# Patient Record
Sex: Female | Born: 1960 | Race: Black or African American | Hispanic: No | Marital: Married | Smoking: Never smoker
Health system: Southern US, Community
[De-identification: ages and names within clinical notes are randomized; demographics above are authoritative.]

## PROBLEM LIST (undated history)

## (undated) DIAGNOSIS — IMO0002 Reserved for concepts with insufficient information to code with codable children: Secondary | ICD-10-CM

## (undated) DIAGNOSIS — K219 Gastro-esophageal reflux disease without esophagitis: Secondary | ICD-10-CM

## (undated) DIAGNOSIS — G4730 Sleep apnea, unspecified: Secondary | ICD-10-CM

## (undated) DIAGNOSIS — I471 Supraventricular tachycardia, unspecified: Secondary | ICD-10-CM

## (undated) DIAGNOSIS — Z87442 Personal history of urinary calculi: Secondary | ICD-10-CM

## (undated) DIAGNOSIS — R918 Other nonspecific abnormal finding of lung field: Secondary | ICD-10-CM

## (undated) DIAGNOSIS — I10 Essential (primary) hypertension: Secondary | ICD-10-CM

## (undated) DIAGNOSIS — G8929 Other chronic pain: Secondary | ICD-10-CM

## (undated) DIAGNOSIS — M549 Dorsalgia, unspecified: Secondary | ICD-10-CM

## (undated) DIAGNOSIS — M722 Plantar fascial fibromatosis: Secondary | ICD-10-CM

## (undated) DIAGNOSIS — Z8616 Personal history of COVID-19: Secondary | ICD-10-CM

## (undated) DIAGNOSIS — R51 Headache: Secondary | ICD-10-CM

## (undated) HISTORY — DX: Gastro-esophageal reflux disease without esophagitis: K21.9

## (undated) HISTORY — DX: Essential (primary) hypertension: I10

## (undated) HISTORY — DX: Plantar fascial fibromatosis: M72.2

## (undated) HISTORY — DX: Personal history of urinary calculi: Z87.442

## (undated) HISTORY — DX: Sleep apnea, unspecified: G47.30

## (undated) HISTORY — DX: Dorsalgia, unspecified: M54.9

## (undated) HISTORY — DX: Reserved for concepts with insufficient information to code with codable children: IMO0002

## (undated) HISTORY — PX: OOPHORECTOMY: SHX86

## (undated) HISTORY — DX: Headache: R51

---

## 1979-08-08 DIAGNOSIS — IMO0002 Reserved for concepts with insufficient information to code with codable children: Secondary | ICD-10-CM

## 1979-08-08 HISTORY — DX: Reserved for concepts with insufficient information to code with codable children: IMO0002

## 1982-12-07 HISTORY — PX: CHOLECYSTECTOMY: SHX55

## 2000-12-07 HISTORY — PX: ABDOMINAL HYSTERECTOMY: SHX81

## 2000-12-07 LAB — HM COLONOSCOPY: HM Colonoscopy: NORMAL

## 2006-08-11 ENCOUNTER — Ambulatory Visit: Payer: Self-pay | Admitting: Family Medicine

## 2006-08-19 ENCOUNTER — Encounter: Payer: Self-pay | Admitting: Obstetrics & Gynecology

## 2006-08-19 ENCOUNTER — Ambulatory Visit: Payer: Self-pay | Admitting: Obstetrics & Gynecology

## 2006-12-29 ENCOUNTER — Ambulatory Visit: Payer: Self-pay | Admitting: Family Medicine

## 2007-02-09 ENCOUNTER — Ambulatory Visit: Payer: Self-pay | Admitting: Family Medicine

## 2007-06-24 ENCOUNTER — Telehealth: Payer: Self-pay | Admitting: Family Medicine

## 2007-07-05 ENCOUNTER — Ambulatory Visit: Payer: Self-pay | Admitting: Family Medicine

## 2007-07-05 DIAGNOSIS — R519 Headache, unspecified: Secondary | ICD-10-CM | POA: Insufficient documentation

## 2007-07-05 DIAGNOSIS — Z8711 Personal history of peptic ulcer disease: Secondary | ICD-10-CM | POA: Insufficient documentation

## 2007-07-05 DIAGNOSIS — R51 Headache: Secondary | ICD-10-CM

## 2007-07-05 DIAGNOSIS — K219 Gastro-esophageal reflux disease without esophagitis: Secondary | ICD-10-CM | POA: Insufficient documentation

## 2007-07-05 DIAGNOSIS — Z87442 Personal history of urinary calculi: Secondary | ICD-10-CM | POA: Insufficient documentation

## 2008-01-11 ENCOUNTER — Ambulatory Visit: Payer: Self-pay | Admitting: Family Medicine

## 2008-01-11 ENCOUNTER — Telehealth: Payer: Self-pay | Admitting: Family Medicine

## 2008-01-18 ENCOUNTER — Telehealth: Payer: Self-pay | Admitting: Family Medicine

## 2008-01-25 ENCOUNTER — Ambulatory Visit: Payer: Self-pay | Admitting: Family Medicine

## 2008-01-25 DIAGNOSIS — I10 Essential (primary) hypertension: Secondary | ICD-10-CM | POA: Insufficient documentation

## 2008-01-30 LAB — CONVERTED CEMR LAB
ALT: 20 units/L (ref 0–35)
AST: 22 units/L (ref 0–37)
Albumin: 3.8 g/dL (ref 3.5–5.2)
Alkaline Phosphatase: 78 units/L (ref 39–117)
BUN: 7 mg/dL (ref 6–23)
Basophils Absolute: 0 10*3/uL (ref 0.0–0.1)
Basophils Relative: 1 % (ref 0.0–1.0)
Bilirubin, Direct: 0.1 mg/dL (ref 0.0–0.3)
CO2: 25 meq/L (ref 19–32)
Calcium: 9.3 mg/dL (ref 8.4–10.5)
Chloride: 105 meq/L (ref 96–112)
Cholesterol: 201 mg/dL (ref 0–200)
Creatinine, Ser: 0.8 mg/dL (ref 0.4–1.2)
Direct LDL: 109.4 mg/dL
Eosinophils Absolute: 0.1 10*3/uL (ref 0.0–0.6)
Eosinophils Relative: 2.2 % (ref 0.0–5.0)
GFR calc Af Amer: 98 mL/min
GFR calc non Af Amer: 81 mL/min
Glucose, Bld: 74 mg/dL (ref 70–99)
HCT: 42.8 % (ref 36.0–46.0)
HDL: 70.5 mg/dL (ref >39.0)
Hemoglobin: 13.6 g/dL (ref 12.0–15.0)
Lymphocytes Relative: 45.7 % (ref 12.0–46.0)
MCHC: 31.8 g/dL (ref 30.0–36.0)
MCV: 78.9 fL (ref 78.0–100.0)
Monocytes Absolute: 0.2 10*3/uL (ref 0.2–0.7)
Monocytes Relative: 5.6 % (ref 3.0–11.0)
Neutro Abs: 1.9 10*3/uL (ref 1.4–7.7)
Neutrophils Relative %: 45.5 % (ref 43.0–77.0)
Phosphorus: 2.4 mg/dL (ref 2.3–4.6)
Platelets: 231 10*3/uL (ref 150–400)
Potassium: 3.4 meq/L — ABNORMAL LOW (ref 3.5–5.1)
RBC: 5.42 M/uL — ABNORMAL HIGH (ref 3.87–5.11)
RDW: 12.7 % (ref 11.5–14.6)
Sodium: 139 meq/L (ref 135–145)
TSH: 0.78 microintl units/mL (ref 0.35–5.50)
Total Bilirubin: 0.8 mg/dL (ref 0.3–1.2)
Total CHOL/HDL Ratio: 2.9
Total Protein: 7.2 g/dL (ref 6.0–8.3)
Triglycerides: 44 mg/dL (ref 0–149)
VLDL: 9 mg/dL (ref 0–40)
WBC: 4.2 10*3/uL — ABNORMAL LOW (ref 4.5–10.5)

## 2008-02-06 ENCOUNTER — Encounter: Payer: Self-pay | Admitting: Family Medicine

## 2008-02-10 ENCOUNTER — Telehealth: Payer: Self-pay | Admitting: Family Medicine

## 2008-02-28 ENCOUNTER — Ambulatory Visit: Payer: Self-pay | Admitting: Family Medicine

## 2008-04-03 ENCOUNTER — Ambulatory Visit: Payer: Self-pay | Admitting: Family Medicine

## 2008-04-03 DIAGNOSIS — J309 Allergic rhinitis, unspecified: Secondary | ICD-10-CM | POA: Insufficient documentation

## 2008-04-13 ENCOUNTER — Telehealth: Payer: Self-pay | Admitting: Family Medicine

## 2008-04-26 ENCOUNTER — Encounter: Payer: Self-pay | Admitting: Obstetrics & Gynecology

## 2008-04-26 ENCOUNTER — Ambulatory Visit: Payer: Self-pay | Admitting: Obstetrics & Gynecology

## 2008-05-01 ENCOUNTER — Ambulatory Visit: Payer: Self-pay | Admitting: Family Medicine

## 2008-05-07 LAB — CONVERTED CEMR LAB: Pap Smear: NORMAL

## 2008-05-16 ENCOUNTER — Ambulatory Visit: Payer: Self-pay | Admitting: Specialist

## 2008-05-17 ENCOUNTER — Encounter: Admission: RE | Admit: 2008-05-17 | Discharge: 2008-05-17 | Payer: Self-pay | Admitting: Obstetrics & Gynecology

## 2008-05-29 ENCOUNTER — Telehealth (INDEPENDENT_AMBULATORY_CARE_PROVIDER_SITE_OTHER): Payer: Self-pay | Admitting: *Deleted

## 2008-07-30 ENCOUNTER — Ambulatory Visit: Payer: Self-pay | Admitting: Specialist

## 2009-06-20 ENCOUNTER — Ambulatory Visit: Payer: Self-pay | Admitting: Family Medicine

## 2009-06-20 DIAGNOSIS — Z780 Asymptomatic menopausal state: Secondary | ICD-10-CM | POA: Insufficient documentation

## 2009-06-24 LAB — CONVERTED CEMR LAB
ALT: 43 units/L — ABNORMAL HIGH (ref 0–35)
AST: 35 units/L (ref 0–37)
Albumin: 3.6 g/dL (ref 3.5–5.2)
Alkaline Phosphatase: 99 units/L (ref 39–117)
BUN: 10 mg/dL (ref 6–23)
Basophils Absolute: 0 10*3/uL (ref 0.0–0.1)
Basophils Relative: 0.3 % (ref 0.0–3.0)
Bilirubin, Direct: 0.1 mg/dL (ref 0.0–0.3)
CO2: 29 meq/L (ref 19–32)
Calcium: 9.3 mg/dL (ref 8.4–10.5)
Chloride: 108 meq/L (ref 96–112)
Cholesterol: 230 mg/dL — ABNORMAL HIGH (ref 0–200)
Creatinine, Ser: 0.8 mg/dL (ref 0.4–1.2)
Direct LDL: 132.9 mg/dL
Eosinophils Absolute: 0.1 10*3/uL (ref 0.0–0.7)
Eosinophils Relative: 0.8 % (ref 0.0–5.0)
GFR calc non Af Amer: 97.61 mL/min (ref >60)
Glucose, Bld: 79 mg/dL (ref 70–99)
HCT: 42.7 % (ref 36.0–46.0)
HDL: 84.2 mg/dL (ref >39.00)
Hemoglobin: 14.1 g/dL (ref 12.0–15.0)
Lymphocytes Relative: 23.7 % (ref 12.0–46.0)
Lymphs Abs: 1.9 10*3/uL (ref 0.7–4.0)
MCHC: 33 g/dL (ref 30.0–36.0)
MCV: 80.3 fL (ref 78.0–100.0)
Monocytes Absolute: 0.5 10*3/uL (ref 0.1–1.0)
Monocytes Relative: 5.7 % (ref 3.0–12.0)
Neutro Abs: 5.5 10*3/uL (ref 1.4–7.7)
Neutrophils Relative %: 69.5 % (ref 43.0–77.0)
Platelets: 238 10*3/uL (ref 150.0–400.0)
Potassium: 3.2 meq/L — ABNORMAL LOW (ref 3.5–5.1)
RBC: 5.31 M/uL — ABNORMAL HIGH (ref 3.87–5.11)
RDW: 14.2 % (ref 11.5–14.6)
Sodium: 143 meq/L (ref 135–145)
TSH: 1.09 microintl units/mL (ref 0.35–5.50)
Total Bilirubin: 0.8 mg/dL (ref 0.3–1.2)
Total CHOL/HDL Ratio: 3
Total Protein: 7.3 g/dL (ref 6.0–8.3)
Triglycerides: 43 mg/dL (ref 0.0–149.0)
VLDL: 8.6 mg/dL (ref 0.0–40.0)
WBC: 8 10*3/uL (ref 4.5–10.5)

## 2009-06-25 ENCOUNTER — Encounter: Admission: RE | Admit: 2009-06-25 | Discharge: 2009-06-25 | Payer: Self-pay | Admitting: Family Medicine

## 2009-06-27 ENCOUNTER — Encounter (INDEPENDENT_AMBULATORY_CARE_PROVIDER_SITE_OTHER): Payer: Self-pay | Admitting: *Deleted

## 2009-07-10 ENCOUNTER — Ambulatory Visit: Payer: Self-pay | Admitting: Family Medicine

## 2009-07-10 DIAGNOSIS — E876 Hypokalemia: Secondary | ICD-10-CM | POA: Insufficient documentation

## 2009-07-12 ENCOUNTER — Ambulatory Visit: Payer: Self-pay | Admitting: Family Medicine

## 2009-07-12 LAB — CONVERTED CEMR LAB
OCCULT 1: NEGATIVE
OCCULT 2: NEGATIVE
OCCULT 3: NEGATIVE

## 2009-07-15 ENCOUNTER — Encounter (INDEPENDENT_AMBULATORY_CARE_PROVIDER_SITE_OTHER): Payer: Self-pay | Admitting: *Deleted

## 2009-07-15 LAB — CONVERTED CEMR LAB: Potassium: 3.7 meq/L (ref 3.5–5.1)

## 2009-10-15 ENCOUNTER — Encounter (INDEPENDENT_AMBULATORY_CARE_PROVIDER_SITE_OTHER): Payer: Self-pay | Admitting: *Deleted

## 2009-12-25 ENCOUNTER — Encounter: Payer: Self-pay | Admitting: Family Medicine

## 2010-04-07 ENCOUNTER — Ambulatory Visit: Payer: Self-pay | Admitting: Family Medicine

## 2010-04-07 DIAGNOSIS — R5381 Other malaise: Secondary | ICD-10-CM | POA: Insufficient documentation

## 2010-04-07 DIAGNOSIS — R209 Unspecified disturbances of skin sensation: Secondary | ICD-10-CM | POA: Insufficient documentation

## 2010-04-07 DIAGNOSIS — R002 Palpitations: Secondary | ICD-10-CM | POA: Insufficient documentation

## 2010-04-07 DIAGNOSIS — R5383 Other fatigue: Secondary | ICD-10-CM

## 2010-04-09 ENCOUNTER — Encounter: Payer: Self-pay | Admitting: Family Medicine

## 2010-04-09 LAB — CONVERTED CEMR LAB
ALT: 27 units/L (ref 0–35)
AST: 23 units/L (ref 0–37)
Albumin: 3.4 g/dL — ABNORMAL LOW (ref 3.5–5.2)
Alkaline Phosphatase: 101 units/L (ref 39–117)
BUN: 9 mg/dL (ref 6–23)
Basophils Absolute: 0 10*3/uL (ref 0.0–0.1)
Basophils Relative: 0.5 % (ref 0.0–3.0)
Bilirubin, Direct: 0.1 mg/dL (ref 0.0–0.3)
CO2: 32 meq/L (ref 19–32)
Calcium: 9.3 mg/dL (ref 8.4–10.5)
Chloride: 107 meq/L (ref 96–112)
Creatinine, Ser: 0.8 mg/dL (ref 0.4–1.2)
Eosinophils Absolute: 0.1 10*3/uL (ref 0.0–0.7)
Eosinophils Relative: 1 % (ref 0.0–5.0)
Folate: 9.9 ng/mL
GFR calc non Af Amer: 97.3 mL/min (ref >60)
Glucose, Bld: 78 mg/dL (ref 70–99)
HCT: 41.4 % (ref 36.0–46.0)
Hemoglobin: 13.9 g/dL (ref 12.0–15.0)
Lymphocytes Relative: 28.8 % (ref 12.0–46.0)
Lymphs Abs: 1.9 10*3/uL (ref 0.7–4.0)
MCHC: 33.4 g/dL (ref 30.0–36.0)
MCV: 81.4 fL (ref 78.0–100.0)
Monocytes Absolute: 0.5 10*3/uL (ref 0.1–1.0)
Monocytes Relative: 7.1 % (ref 3.0–12.0)
Neutro Abs: 4.1 10*3/uL (ref 1.4–7.7)
Neutrophils Relative %: 62.6 % (ref 43.0–77.0)
Platelets: 264 10*3/uL (ref 150.0–400.0)
Potassium: 3.6 meq/L (ref 3.5–5.1)
RBC: 5.09 M/uL (ref 3.87–5.11)
RDW: 14.9 % — ABNORMAL HIGH (ref 11.5–14.6)
Sodium: 144 meq/L (ref 135–145)
TSH: 1.52 microintl units/mL (ref 0.35–5.50)
Total Bilirubin: 0.4 mg/dL (ref 0.3–1.2)
Total Protein: 6.6 g/dL (ref 6.0–8.3)
Vit D, 25-Hydroxy: 19 ng/mL — ABNORMAL LOW (ref 30–89)
Vitamin B-12: 383 pg/mL (ref 211–911)
WBC: 6.6 10*3/uL (ref 4.5–10.5)

## 2010-07-02 ENCOUNTER — Ambulatory Visit: Payer: Self-pay | Admitting: Family Medicine

## 2010-07-02 DIAGNOSIS — E559 Vitamin D deficiency, unspecified: Secondary | ICD-10-CM | POA: Insufficient documentation

## 2010-07-03 LAB — CONVERTED CEMR LAB: Vit D, 25-Hydroxy: 21 ng/mL — ABNORMAL LOW (ref 30–89)

## 2010-08-13 ENCOUNTER — Telehealth: Payer: Self-pay | Admitting: Family Medicine

## 2010-09-05 ENCOUNTER — Ambulatory Visit: Payer: Self-pay | Admitting: Family Medicine

## 2010-09-05 DIAGNOSIS — M722 Plantar fascial fibromatosis: Secondary | ICD-10-CM | POA: Insufficient documentation

## 2010-09-08 LAB — CONVERTED CEMR LAB: Vit D, 25-Hydroxy: 35 ng/mL (ref 30–89)

## 2010-09-10 ENCOUNTER — Encounter: Admission: RE | Admit: 2010-09-10 | Discharge: 2010-09-10 | Payer: Self-pay | Admitting: Family Medicine

## 2010-09-12 LAB — HM MAMMOGRAPHY: HM Mammogram: NORMAL

## 2010-09-15 ENCOUNTER — Encounter: Payer: Self-pay | Admitting: Family Medicine

## 2011-01-06 NOTE — Assessment & Plan Note (Signed)
Summary: NOT FEELING WELL/DLO   Vital Signs:  Patient profile:   50 year old female Height:      63 inches Weight:      164 pounds BMI:     29.16 Temp:     98.7 degrees F oral Pulse rate:   100 / minute Pulse rhythm:   regular BP sitting:   138 / 84  (left arm) Cuff size:   regular  Vitals Entered By: Lewanda Rife LPN (Apr 07, 453 11:50 AM) CC: Feels tired rt hand swollen on and off and fingers tingle in rt hand, heart feels like flutters on and off   History of Present Illness: feels tired and heart flutters on and off   just really tired -for about 3 weeks  occ heart flutters -- used to happen once per month -- just for 1-2 seconds  no pain  happens any time- exertion or rest  has to take deep breath occ due to fatigue - but not short of breath  falls asleep easily    does snore at night -- wakes up unrested and falls asleep easily during the day  does not know much about sleep apnea no witnessed apnea   no new stress   R hand tingles on and off  and feels a bit swollen  has diclofenac -- does not take often  gets steroid shots in her back occas   stopped her potassium --did not know she was supposed to keep taking it   may be going through menopause - some hot flashes      Allergies: 1)  ! 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 Eyes:  Denies blurring and eye pain. CV:  Denies chest pain or discomfort, palpitations, and shortness of breath with exertion. Resp:  Denies cough, pleuritic, shortness of breath, and wheezing. GI:  Denies abdominal pain, bloody stools, change in bowel habits, indigestion, nausea, and vomiting. MS:  Denies joint pain, joint redness, and joint swelling. Derm:  Denies itching, lesion(s), poor wound healing, and rash. Neuro:  Complains of tingling; denies disturbances in coordination, falling down, headaches, numbness, and weakness. Psych:  Denies anxiety and depression. Endo:  Denies cold intolerance, excessive thirst, excessive urination, and heat intolerance.  Physical Exam  General:  overweight but generally well appearing  Head:  normocephalic, atraumatic, and no abnormalities observed.   Eyes:  vision grossly intact, pupils equal, pupils round, and pupils reactive to light.  no conjunctival pallor, injection or icterus  Nose:  no nasal discharge.   Mouth:  pharynx pink and moist.   Neck:  supple with full rom and no masses or thyromegally, no JVD or carotid bruit  Chest Wall:  No deformities, masses, or tenderness noted. Lungs:  Normal respiratory effort, chest expands symmetrically. Lungs are clear to auscultation, no crackles or wheezes. Heart:  Normal rate and regular rhythm. S1 and S2 normal without gallop, murmur, click, rub or  other extra sounds. Abdomen:  Bowel sounds positive,abdomen soft and non-tender without masses, organomegaly or hernias noted. Msk:  No deformity or scoliosis noted of thoracic or lumbar spine.  R hand and wrist are not visibly swollen today with no joint changes Pulses:  R and L carotid,radial,femoral,dorsalis pedis and posterior tibial pulses are full and equal bilaterally Extremities:  No clubbing, cyanosis, edema, or deformity noted with normal full range of motion of all joints.   Neurologic:  neg tinel and phalen R wrist  nl  grip strength normal in all extremities, sensation intact to light touch, and sensation intact to pinprick.   Skin:  Intact without suspicious lesions or rashes Cervical Nodes:  No lymphadenopathy noted Inguinal Nodes:  No significant adenopathy Psych:  normal affect, talkative and pleasant -- does seem fatigued    Impression & Recommendations:  Problem # 1:  FATIGUE (ICD-780.79) Assessment New poss multifactorial with menopause/ stress and poss low K lab today and update disc poss of sleep apnea and handout given also  if all nl - consider sleep eval Orders: Venipuncture ([address]) TLB-BMP (Basic Metabolic Panel-BMET) (80048-METABOL) TLB-Hepatic/Liver Function Pnl (80076-HEPATIC) TLB-CBC Platelet - w/Differential (85025-CBCD) TLB-TSH (Thyroid Stimulating Hormone) (84443-TSH) TLB-B12 + Folate Pnl (82746_82607-B12/FOL) T-Vitamin D (25-Hydroxy) (60454-09811)  Problem # 2:  PALPITATIONS (ICD-785.1) occ and fleeting - sounds like pac or pvc- but none on ekg check K - disc imp of this and tsh and update consider cardiol consult if needed  Orders: Venipuncture (91478) TLB-BMP (Basic Metabolic Panel-BMET) (80048-METABOL) TLB-Hepatic/Liver Function Pnl (80076-HEPATIC) TLB-CBC Platelet - w/Differential (85025-CBCD) TLB-TSH (Thyroid Stimulating Hormone) (84443-TSH) TLB-B12 + Folate Pnl (82746_82607-B12/FOL) T-Vitamin D (25-Hydroxy) (29562-13086) EKG w/ Interpretation (93000)  Problem # 3:  DISTURBANCE OF SKIN SENSATION (ICD-782.0) Assessment: New with nl exam  will try wrist splint at night - with poss of carpal tunnel and update  Complete Medication List: 1)  Omeprazole 20 Mg Cpdr (Omeprazole) .Marland Kitchen.. 1 by mouth once daily in am 2)  Norvasc 5 Mg Tabs (Amlodipine besylate) .Marland Kitchen.. 1 by mouth once daily 3)  K Dur 20 Meq  .... 1 by mouth once daily 4)  Gabapentin 300 Mg Caps (Gabapentin) .... Take one capsule daily as needed 5)  Tizanidine Hcl 4 Mg Tabs (Tizanidine hcl) .... Take one  tablet every 8 hours as needed 6)  Diclofenac Sodium 75 Mg Tbec (Diclofenac sodium) .... Take one tablet twice a day as needed  Patient Instructions: 1)  try a carpal tunnel wrist splint from the drugstore for nightime or any time fingers tingle- let me know if not improved  2)  labs today for fatigue and potassium  3)  I wll let you know if you need potassium 4)  if all is normal -- I may consider consult with sleep specialist - please read the into on sleep apnea   Current Allergies (reviewed today): No known allergies

## 2011-01-06 NOTE — Assessment & Plan Note (Signed)
Summary: CPX/JRR   Vital Signs:  Patient profile:   50 year old female Height:      63 inches Weight:      167.25 pounds BMI:     29.73 Temp:     98.8 degrees F oral Pulse rate:   88 / minute Pulse rhythm:   regular BP sitting:   122 / 84  (left arm) Cuff size:   regular  Vitals Entered By: Lewanda Rife LPN (September 05, 2010 10:16 AM) CC: CPX GYN does pap and breast exam   History of Present Illness: here for health mt exam and to disc chronic med problems   her back is flaring up this week -- had started walking recently / no heavy lifting  had gone ortho years ago in [place] -- found nothing  is taking aleve - not helping any  has pinched nerve in back (saw neurol for this too )  has had mris -- nothing   was going to foot center for plantar fasciitis -- not improving with shots  needs 2nd opinion for that -- saw Dr Elvin So  saw neurologist for this too - no help   is feeling down in the ams   a lot of chronic pain issues    wt is up 3 lb  bp is 122/84  hyst partial 1 ovary in past (fibroids)  sees gyn -- went 09  pap was good  mam 7/10 nl --needs to set that up / and wants breast exam today no gyn problems   td7/10 flu shot -never had one and does not want one   colon screen -- 2002 colonosc -- was ok with no polyps   vit d def- on high dose tx -- had drawn today does feel better with more vit D -- fatigue is a little imp   had nl labs in may  last lipids were 7/10 with LDL 132    Allergies: 1)  ! 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 Eyes:  Denies blurring and eye irritation. CV:  Denies chest pain or discomfort, lightheadness, palpitations, and shortness of breath with exertion. Resp:  Denies cough, shortness of breath, and wheezing. GI:  Denies abdominal pain, change in bowel habits, indigestion, nausea, and vomiting. GU:  Denies dysuria, hematuria, and urinary frequency. MS:  Complains of joint pain, low back pain, mid back pain, and stiffness; denies joint redness and joint swelling. Derm:  Denies itching, lesion(s), poor wound healing, and rash. Neuro:  Denies numbness, tingling, and weakness. Psych:  Complains of irritability; denies panic attacks, sense of great danger, and suicidal thoughts/plans; is getting depressed about chronic pain . Endo:  Denies excessive thirst and excessive urination. Heme:  Denies abnormal bruising and bleeding.  Physical Exam  General:  overweight but generally well appearing  Head:  normocephalic, atraumatic, and no abnormalities observed.   Eyes:  vision grossly intact, pupils equal, pupils round, and pupils reactive to light.  no conjunctival pallor, injection or icterus  Ears:  R ear normal and L ear normal.   Nose:  no nasal discharge.   Mouth:  pharynx pink and moist.   Neck:  supple with full rom and no masses or thyromegally, no JVD or carotid bruit  Chest Wall:  No deformities, masses, or tenderness noted. Breasts:  No mass, nodules, thickening, tenderness, bulging, retraction, inflamation, nipple discharge or skin changes noted.   Lungs:  Normal respiratory effort, chest expands symmetrically.  Lungs are clear to auscultation, no crackles or wheezes. Heart:  Normal rate and regular rhythm. S1 and S2 normal without gallop, murmur, click, rub or other extra sounds. Abdomen:  Bowel sounds positive,abdomen soft and non-tender without masses, organomegaly or hernias noted. no renal  bruits  Msk:  poor rom LS  limited flexion and extension some tenderness L4 and L5  Pulses:  R and L carotid,radial,femoral,dorsalis pedis and posterior tibial pulses are full and equal bilaterally Extremities:  No clubbing, cyanosis, edema, or deformity noted with normal full range of motion of all joints.   Neurologic:  general hypersensitivity in feet  strength normal in all extremities, gait normal, and DTRs symmetrical and normal.   Skin:  Intact without suspicious lesions or rashes some lentigos  Cervical Nodes:  No lymphadenopathy noted Axillary Nodes:  No palpable lymphadenopathy Inguinal Nodes:  No significant adenopathy Psych:  normal affect, talkative and pleasant    Impression & Recommendations:  Problem # 1:  HEALTH MAINTENANCE EXAM (ICD-V70.0) Assessment Comment Only reviewed health habits including diet, exercise and skin cancer prevention reviewed health maintenance list and family history rev labs from may rev last lipid pend vit D  Problem # 2:  PLANTAR FASCIITIS (ICD-728.71) Assessment: New this is persistant despite injections/ nsaids will ref to new pod for 2nd op Her updated medication list for this problem includes:    Diclofenac Sodium 75 Mg Tbec (Diclofenac sodium) .Marland Kitchen... Take one tablet twice a day as needed  Orders: Podiatry Referral (Podiatry)  Problem # 3:  UNSPECIFIED VITAMIN D DEFICIENCY (ICD-268.9) Assessment: Improved drawn today after high dose tx and is feeling some better  pend result to adv further   Problem # 4:  ESSENTIAL HYPERTENSION (ICD-401.9) Assessment: Unchanged  good control - no change in med get back to exercise when imp Her updated  medication list for this problem includes:    Norvasc 5 Mg Tabs (Amlodipine besylate) .Marland Kitchen... 1 by mouth once daily  BP today: 122/84 Prior BP: 138/84 (04/07/2010)  Labs Reviewed: K+: 3.6 (04/07/2010) Creat: : 0.8 (04/07/2010)   Chol: 230 (06/20/2009)   HDL: 84.20 (06/20/2009)   LDL: DEL (01/25/2008)   TG: 43.0 (06/20/2009)  Problem # 5:  G E R D (ICD-530.81) Assessment: Deteriorated needs to get back on omeprazole since sore throat is returning px done update if not imp  The following medications were removed from the medication list:    Omeprazole 20 Mg Cpdr (Omeprazole) .Marland Kitchen... 1 by mouth once daily in am as needed Her updated medication list for this problem includes:    Prilosec 20 Mg Cpdr (Omeprazole) .Marland Kitchen... 1 by mouth once daily  Problem # 6:  BACK PAIN (ICD-724.5) Assessment: New acute on chronic ? pinched nerve sent for neurol notes  per pt nl mri urged to try gabapentin again --  and that sedation gradually improves  Her updated medication list for this problem includes:    Tizanidine Hcl 4 Mg Tabs (Tizanidine hcl) .Marland Kitchen... Take one tablet every 8 hours as needed    Diclofenac Sodium 75 Mg Tbec (Diclofenac sodium) .Marland Kitchen... Take one tablet twice a day as needed  Complete Medication List: 1)  Norvasc 5 Mg Tabs (Amlodipine besylate) .Marland Kitchen.. 1 by mouth once daily 2)  Klor-con M20 20 Meq Cr-tabs (Potassium chloride crys cr) .Marland Kitchen.. 1 by mouth once daily 3)  Gabapentin 300 Mg Caps (Gabapentin) .... Take one capsule daily as needed 4)  Tizanidine Hcl 4 Mg Tabs (Tizanidine hcl) .... Take one tablet every 8 hours as needed 5)  Diclofenac Sodium 75 Mg Tbec (Diclofenac sodium) .... Take one tablet twice a day as needed 6)  Vitamin D (ergocalciferol) 50000 Unit Caps (Ergocalciferol) .... Take one capsule by mouth every week for 10 weeks 7)  Prilosec 20 Mg Cpdr (Omeprazole) .Marland Kitchen.. 1 by mouth once daily  Other Orders: Radiology Referral (Radiology)  Patient Instructions: 1)  please send for last  neurology note  2)  we will do mammogram ref at check out  3)  we will do podiatry ref at check out  4)  gabapentin does really help nerve pain but it takes 3-5 days to get over the sedation  5)  work on healthy low fat and low sugar diet the best you can  Prescriptions: PRILOSEC 20 MG CPDR (OMEPRAZOLE) 1 by mouth once daily  #30 x 11   Entered and Authorized by:   Judith Part MD   Signed by:   Judith Part MD on 09/05/2010   Method used:   [facility]        [facility] [facility]       [address]       [place], [place]  [address]       Ph: [phone] or [phone]       Fax: [phone]   RxID:   [id number] KLOR-CON M20 20 MEQ CR-TABS (POTASSIUM CHLORIDE CRYS CR) 1 by mouth once daily  #30 x 11   Entered and Authorized by:   Judith Part MD   Signed by:   Judith Part MD on 09/05/2010   Method used:   [facility]        [facility] [id number]       [address]       [place], [place]  [address]       Ph: [phone] or [phone]       Fax: [phone]   RxID:   [id number]   Current Allergies (reviewed today): No known allergies    Preventive Care Screening  Colonoscopy:    Date:  12/07/2000    Results:  normal      colonoscopy was in [place] in 2012 -- for symptoms

## 2011-01-06 NOTE — Progress Notes (Signed)
Summary: Amlodipine 5mg  [facility] form  Phone Note From Pharmacy Call back at [phone]   Caller: [facility] Call For: Dr Milinda Antis  Summary of Call: [facility] faxed a form for rx renewal for Amlodipine 5mg . Form is on your shelf in the in box. Initial call taken by: Lewanda Rife LPN,  August 13, 2010 4:45 PM  Follow-up for Phone Call        form done and in nurse in box  Follow-up by: Judith Part MD,  August 13, 2010 4:58 PM  Additional Follow-up for Phone Call Additional follow up Details #1::        Completed form faxed to [phone] as instructed.Lewanda Rife LPN  August 14, 2010 10:53 AM     New/Updated Medications: NORVASC 5 MG TABS (AMLODIPINE BESYLATE) 1 by mouth once daily Prescriptions: NORVASC 5 MG TABS (AMLODIPINE BESYLATE) 1 by mouth once daily  #90 x 3   Entered and Authorized by:   Judith Part MD   Signed by:   Lewanda Rife LPN on 25/36/6440   Method used:   Historical   RxID:   [id number]

## 2011-01-06 NOTE — Miscellaneous (Signed)
Summary: Vitamin D 50000iu rx  Clinical Lists Changes  Medications: Added new medication of VITAMIN D (ERGOCALCIFEROL) 50000 UNIT CAPS (ERGOCALCIFEROL) take one capsule by mouth every week for 10 weeks - Signed Rx of VITAMIN D (ERGOCALCIFEROL) 50000 UNIT CAPS (ERGOCALCIFEROL) take one capsule by mouth every week for 10 weeks;  #10 x 0;  Signed;  Entered by: Lewanda Rife LPN;  Authorized by: Judith Part MD;  Method used: Electronically to [id number] [address], [place], [place]  [address], Ph: [phone] or [phone], Fax: [phone]    Prescriptions: VITAMIN D (ERGOCALCIFEROL) 50000 UNIT CAPS (ERGOCALCIFEROL) take one capsule by mouth every week for 10 weeks  #10 x 0   Entered by:   Lewanda Rife LPN   Authorized by:   Judith Part MD   Signed by:   Lewanda Rife LPN on 40/09/2724   Method used:   [facility]        [id number]       [address]       [place], [place]  [address]       Ph: [phone] or [phone]       Fax: [phone]   RxID:   [id number]  Pt has lab appt scheduled 06/25/10 to recheck Vit D level.Lewanda Rife LPN  Apr 09, 2024 11:21 AM  Current Allergies: No known allergies

## 2011-01-06 NOTE — Letter (Signed)
Summary: [facility]  [facility] Records   Imported By: Beau Fanny 09/12/2010 14:11:50  _____________________________________________________________________  External Attachment:    Type:   Image     Comment:   External Document

## 2011-01-06 NOTE — Letter (Signed)
Summary: Results Follow up Letter  [facility]  [address] [place], [place] [address]   Phone: [phone]  Fax: [phone]    09/15/2010 MRN: [id number]  Justis Klinkner [address] [place], [place]  [address]  Dear Ms. Madani,  The following are the results of your recent test(s):  Test         Result    Pap Smear:        Normal _____  Not Normal _____ Comments: ______________________________________________________ Cholesterol: LDL(Bad cholesterol):         Your goal is less than:         HDL (Good cholesterol):       Your goal is more than: Comments:  ______________________________________________________ Mammogram:        Normal __X___  Not Normal _____ Comments: Please repeat mammogram in one year.  ___________________________________________________________________ Hemoccult:        Normal _____  Not normal _______ Comments:    _____________________________________________________________________ Other Tests:    We routinely do not discuss normal results over the telephone.  If you desire a copy of the results, or you have any questions about this information we can discuss them at your next office visit.   Sincerely,      Roxy Manns, MD

## 2011-04-21 NOTE — Assessment & Plan Note (Signed)
NAME:  Mindy Long, Mindy Long               ACCOUNT NO.:  [id number]   MEDICAL RECORD NO.:  [id number]          PATIENT TYPE:  POB   LOCATION:  [facility]         FACILITY:  [facility]   PHYSICIAN:  Elsie Lincoln, MD      DATE OF BIRTH:  [birth year]   DATE OF SERVICE:  04/26/2008                                  CLINIC NOTE   The patient is a 50 year old female who presents for her annual exam.  She had her last Pap smear in 2007.  The patient is status post  hysterectomy having no OB complaints.  She is not having an excessive  amount of hot flashes.  However, she wonders if she is in menopause.  We  will check [facility] today.  She is sexually active and having no problems.  She is not having any pelvic pain.  She is having new onset back pain  and she sees a primary care doctor for that.   PAST MEDICAL HISTORY:  The patient newly diagnosed with hypertension and  is on atenolol.  Also has low back pain.   PAST SURGICAL HISTORY:  No changes.  History of a cholecystectomy and a  hysterectomy, and an oophorectomy.   GYN HISTORY:  No changes.   SOCIAL HISTORY:  The patient is now unemployed and looking for a job.  Denies alcohol, drugs, or tobacco.   REVIEW OF SYSTEMS:  Negative.   MEDICATIONS:  Imitrex, atenolol.   ALLERGIES:  None.   FAMILY HISTORY:  Brother with stomach cancer.  Father with high blood  pressure and father has diabetes.   EXAM:  Blood pressure 109/89, pulse 110, weight 163, height 05/03.  GENERAL:  Well-nourished, well-developed apparent distress.  HEENT:  Normocephalic, atraumatic.  Multiple amalgam-filled cavities.  Thyroid no masses.  LUNGS:  Clear to station bilaterally.  HEART:  Regular rate and rhythm.  BREASTS:  No masses, no lymphadenopathy, no nipple discharge.  ABDOMEN:  Obese, nontender, no organomegaly.  No hernia.  GENITALIA:  Tanner five.  There is an area of well-healed folliculitis  in the right mons pubis.  This has been coming and going for  several  months and probably some scarring.  The patient reminded not to shave  genitalia.  Vagina atrophic.  Vaginal cuff intact.  No cystocele,  rectocele.  Urethra nontender.  No uterus.  No ovaries felt on either  side.  No masses, nontender.  EXTREMITIES:  Nontender.   ASSESSMENT/PLAN:  A 50 year old female for well woman exam.  1. Pap smear.  2. Mammogram ordered.  3. Check [facility] with the results to phone.  4. If menopausal go over calcium recommendations.  5. Return to clinic in a year for yearly exam.           ______________________________  Elsie Lincoln, MD     KL/MEDQ  D:  04/26/2008  T:  04/26/2008  Job:  [id number]

## 2011-04-24 NOTE — Group Therapy Note (Signed)
NAME:  Mindy Long, Mindy Long               ACCOUNT NO.:  [id number]   MEDICAL RECORD NO.:  [id number]          PATIENT TYPE:  POB   LOCATION:  [facility]                   FACILITY:  [facility]   PHYSICIAN:  Carolanne Grumbling, M.D.   DATE OF BIRTH:  1961-03-07   DATE OF SERVICE:                                    CLINIC NOTE   CHIEF COMPLAINT:  Pelvic pain on exam.   HISTORY OF PRESENT ILLNESS:  A 50 year old, G3, P3-0-0-3 complains of pelvic  pain for the last year on an off.  She reports that it was happening twice a  month and is down to one time a month.  She rates it a 9/10 on a pain scale  and describes it as a deep pressure pain made better by Aleve and she does  not know what makes it worse.   She thinks that she may be going through menopause.  She complains of hot  flashes and decreased sex drive.  She is status post hysterectomy and she  had one ovary removed, but she is not sure which one.   PAST MEDICAL HISTORY:  Denies any chronic medical illnesses including  hypertension or asthma.   PAST SURGICAL HISTORY:  1. Cholecystectomy.  2. Total abdominal hysterectomy and oophorectomy for fibroids.   PAST GYNECOLOGICAL HISTORY:  History of abnormal Pap smear approximately 20  years ago that she had cryosurgery for.  Denies any sexually transmitted  diseases.   SOCIAL HISTORY:  The patient is married and moved here from [place]  because of husband's job relocated into this area.  She does not work  outside the home.  Denies alcohol, drugs or tobacco use.   REVIEW OF SYSTEMS:  A complete 14-point review of systems was completed.  Please see intake form.   PHYSICAL EXAMINATION:  VITAL SIGNS:  Blood pressure 128/99, pulse 58, weight  166.  GENERAL:  Well-developed, well-nourished female in no apparent distress.  HEENT:  Normocephalic, atraumatic.  NECK:  Supple with no thyromegaly.  BREASTS:  Symmetric.  No lumps or nodules.  No nipple discharge.  ABDOMEN:  Soft, nontender,  nondistended.  Well-healed surgical incisions.  GENITALIA:  Normal.  External genitalia which does show signs of estrogen  deficiency as there is atrophy of her labia minora, pink rugae.  Cervix is  surgically absent.  Vaginal cuff within normal limits.  Pap smear done with  adnexa free of masses.   ASSESSMENT/PLAN:  1. Pelvic pain.  Since the patient's pain is so infrequent, I would not      recommend any further workup.  She possibly could have some adhesions      causing the problem and it is not completely clear.  Continue to use      over-the-counter medicines.  I did give her one sample of Ultram ER 200      mg to take one tablet daily p.r.n.  2. Pap smear done today.  Mammogram scheduled.  Lipids are checked by her      primary care physician.  3. Hot flashes/perimenopause.  Recommend that she try some over-the-  counter remedies and some kind of relief.  If she decides she wants to      be on estrogen, she is going to return and we can discuss that more at      length.  Will also check a TSH today.  4. Vaginal odor.  This is currently not a problem.  Recommend cotton      underwear and avoiding heavily perfumed soaps.           ______________________________  Carolanne Grumbling, M.D.     TW/MEDQ  D:  08/19/2006  T:  08/20/2006  Job:  [id number]

## 2011-05-22 ENCOUNTER — Telehealth: Payer: Self-pay | Admitting: *Deleted

## 2011-05-22 MED ORDER — OMEPRAZOLE 20 MG PO CPDR: 20.0000 mg | DELAYED_RELEASE_CAPSULE | Freq: Every day | ORAL | Status: DC

## 2011-05-22 NOTE — Telephone Encounter (Signed)
Rx sent to pharmacy   

## 2011-07-20 ENCOUNTER — Ambulatory Visit (INDEPENDENT_AMBULATORY_CARE_PROVIDER_SITE_OTHER): Payer: BC Managed Care – PPO | Admitting: Family Medicine

## 2011-07-20 ENCOUNTER — Ambulatory Visit: Payer: Self-pay | Admitting: Family Medicine

## 2011-07-20 ENCOUNTER — Encounter: Payer: Self-pay | Admitting: Family Medicine

## 2011-07-20 VITALS — BP 130/80 | HR 80 | Temp 98.5°F | Ht 63.0 in | Wt 172.8 lb

## 2011-07-20 DIAGNOSIS — M549 Dorsalgia, unspecified: Secondary | ICD-10-CM

## 2011-07-20 MED ORDER — CYCLOBENZAPRINE HCL 10 MG PO TABS: 10.0000 mg | ORAL_TABLET | Freq: Three times a day (TID) | ORAL | Status: AC | PRN

## 2011-07-20 MED ORDER — HYDROCODONE-ACETAMINOPHEN 5-500 MG PO TABS: 1.0000 | ORAL_TABLET | Freq: Four times a day (QID) | ORAL | Status: AC | PRN

## 2011-07-20 MED ORDER — TRAMADOL HCL 50 MG PO TABS: 50.0000 mg | ORAL_TABLET | Freq: Four times a day (QID) | ORAL | Status: AC | PRN

## 2011-07-20 MED ORDER — PREDNISONE 10 MG PO TABS: ORAL_TABLET | ORAL | Status: AC

## 2011-07-20 NOTE — Progress Notes (Signed)
  Subjective:    Patient ID: Mindy Long, female    DOB: 01/18/1961, 50 y.o.   MRN: [id number]  HPI  Mindy Long, a 50 y.o. female presents today in the office for the following:  Acute 2 day history of LBP  Was in the closet, then felt some acute pain in her low back. Yesterday. Mostly in the low back.  All on the right - moving to the upper part. No lower radiculopathy  Heating pad - helped a little Ibuprofen.   Never was fully asymptomatic. Has had some LBP for a number of years -- not like this. Yesterday. 9/10 pain.  Not working.   Takes care of grandchildren: 24 and 58 yo.  No numbness or tingling  The PMH, PSH, Social History, Family History, Medications, and allergies have been reviewed in [facility], and have been updated if relevant.  Review of Systems REVIEW OF SYSTEMS  GEN: No fevers, chills. Nontoxic. Primarily MSK c/o today. MSK: Detailed in the HPI GI: tolerating PO intake without difficulty Neuro: No numbness, parasthesias, or tingling associated. Otherwise the pertinent positives of the ROS are noted above.      Objective:   Physical Exam   Physical Exam  Blood pressure 130/80, pulse 80, temperature 98.5 F (36.9 C), temperature source Oral, height 5\' 3"  (1.6 m), weight 172 lb 12.8 oz (78.382 kg), SpO2 99.00%.  Gen: Well-developed,well-nourished,in no acute distress; alert,appropriate and cooperative throughout examination HEENT: Normocephalic and atraumatic without obvious abnormalities.  Ears, externally no deformities Pulm: Breathing comfortably in no respiratory distress Range of motion at  the waist: Flexion: normal Extension: normal Lateral bending: normal Rotation: all normal  No echymosis or edema Rises to examination table with notable difficulty Gait: mildly antalgic  Inspection/Deformity: N Paraspinus T: Y  - diffusely tender on the R side  B Ankle Dorsiflexion (L5,4): 5/5 B Great Toe Dorsiflexion (L5,4): 5/5 Heel Walk (L5): WNL Toe  Walk (S1): WNL Rise/Squat (L4): WNL  SENSORY B Medial Foot (L4): WNL B Dorsum (L5): WNL B Lateral (S1): WNL Light Touch: WNL Pinprick: WNL  REFLEXES Knee (L4): 2+ Ankle (S1): 2+  B SLR, seated: neg B SLR, supine: neg B FABER: neg B Greater Troch: NT B Log Roll: neg B Stork: unable to complete B Sciatic Notch: NT       Assessment & Plan:   1. BACK PAIN  predniSONE (DELTASONE) 10 MG tablet, traMADol (ULTRAM) 50 MG tablet, cyclobenzaprine (FLEXERIL) 10 MG tablet, HYDROcodone-acetaminophen (VICODIN) 5-500 MG per tablet   Acute LB injury. No red flag features -- treat aggressively given level of pain and impairment

## 2011-09-11 ENCOUNTER — Ambulatory Visit (INDEPENDENT_AMBULATORY_CARE_PROVIDER_SITE_OTHER)
Admission: RE | Admit: 2011-09-11 | Discharge: 2011-09-11 | Disposition: A | Discharge status: Home / Self Care | Payer: BC Managed Care – PPO | Source: Ambulatory Visit | Attending: Family Medicine | Admitting: Family Medicine

## 2011-09-11 ENCOUNTER — Encounter: Payer: Self-pay | Admitting: Family Medicine

## 2011-09-11 ENCOUNTER — Ambulatory Visit (INDEPENDENT_AMBULATORY_CARE_PROVIDER_SITE_OTHER): Payer: BC Managed Care – PPO | Admitting: Family Medicine

## 2011-09-11 VITALS — BP 128/84 | HR 92 | Temp 98.2°F | Ht 63.0 in | Wt 176.8 lb

## 2011-09-11 DIAGNOSIS — M722 Plantar fascial fibromatosis: Secondary | ICD-10-CM

## 2011-09-11 DIAGNOSIS — M79609 Pain in unspecified limb: Secondary | ICD-10-CM

## 2011-09-11 DIAGNOSIS — M79673 Pain in unspecified foot: Secondary | ICD-10-CM

## 2011-09-11 DIAGNOSIS — M549 Dorsalgia, unspecified: Secondary | ICD-10-CM

## 2011-09-11 DIAGNOSIS — G8929 Other chronic pain: Secondary | ICD-10-CM | POA: Insufficient documentation

## 2011-09-11 NOTE — Assessment & Plan Note (Signed)
Chronic intermittent low back pain since 2009 (acute flare in aug req prednisone) Now bothering her pretty much all the time and interferes with ability to exercise  Some spinal and R muscular tenderness today X rays 2009- re checking today , had PT as well  No neurol red flags  May need ortho ref vs MRI dep on result

## 2011-09-11 NOTE — Assessment & Plan Note (Signed)
Both from plantar fasciitis/ and toenail problem Wanted pod 2nd opinion - and will refer ? If onchomycosis of big toenails  Disc use of heat/ ice / stretching and supportive shoe

## 2011-09-11 NOTE — Patient Instructions (Signed)
xrays today and will update you with a plan Keep stretching and walking and use heat when you can We will do podiatry ref at check out

## 2011-09-11 NOTE — Progress Notes (Signed)
Subjective:    Patient ID: Mindy Long, female    DOB: 1961/09/18, 50 y.o.   MRN: [id number]  HPI Here for foot pain and back pain today  Chronic back pain  Saw Dr Patsy Lager in [place] for acute back pain after injury to low back tx with prednisone/ flexeril/ ultram/ vicodin X rays 09 normal  All started in 2009 -nl x ray and had PT also  Now keeps flaring up  Right now - done with all back medicines  Center of low back  Worse with sitting - better with getting up and walking  Gets stiff in bed at night and has to move around  Very stiff in the ams  Pain does not shoot to legs No numbness/ weakness   Tries to walk for exercise  No other exercise   Foot pain- both feet  Toenails are coming off- loose  No new or malfitting shoes  Prod with R first toe Feet -hurt to walk a period of time  Steroid helped her feet quite a bit  Pain is dull and burning - around the lateral foot and also medial achilles area is quite tender  Saw Dr Orland Jarred in the past - told she would need surgery - but never dx her with specific problems   Patient Active Problem List  Diagnoses  . UNSPECIFIED VITAMIN D DEFICIENCY  . HYPOKALEMIA  . ESSENTIAL HYPERTENSION  . ALLERGIC RHINITIS  . G E R D  . PEPTIC ULCER DISEASE  . BACK PAIN  . PLANTAR FASCIITIS  . FATIGUE  . DISTURBANCE OF SKIN SENSATION  . HEADACHE  . PALPITATIONS  . NEPHROLITHIASIS, HX OF  . PERIMENOPAUSAL STATUS  . Foot pain   Past Medical History  Diagnosis Date  . Headache   . GERD (gastroesophageal reflux disease)   . Plantar fasciitis   . History of nephrolithiasis   . Ulcer     peptic ulcer disease   Past Surgical History  Procedure Date  . Cholecystectomy   . Abdominal hysterectomy    History  Substance Use Topics  . Smoking status: Never Smoker   . Smokeless tobacco: Not on file  . Alcohol Use: No   Family History  Problem Relation Age of Onset  . Stroke Father   . Hypertension Father   . Cancer Brother    stomach tumor   No Known Allergies Current Outpatient Prescriptions on File Prior to Visit  Medication Sig Dispense Refill  . amLODipine (NORVASC) 5 MG tablet Take 5 mg by mouth daily.        . Cholecalciferol (VITAMIN D3) 2000 UNITS capsule Take 2,000 Units by mouth daily.        Marland Kitchen omeprazole (PRILOSEC) 20 MG capsule Take 1 capsule (20 mg total) by mouth daily.  30 capsule  6     Review of Systems Review of Systems  Constitutional: Negative for fever, appetite change, fatigue and unexpected weight change.  Eyes: Negative for pain and visual disturbance.  Respiratory: Negative for cough and shortness of breath.   Cardiovascular: Negative for cp or palpitations    Gastrointestinal: Negative for nausea, diarrhea and constipation.  Genitourinary: Negative for urgency and frequency.  Skin: Negative for pallor or rash  pos for large tonails being loose and bothersome/ thickened  MSK pos for low back pain and foot pain Neurological: Negative for weakness, light-headedness, numbness and headaches.  Hematological: Negative for adenopathy. Does not bruise/bleed easily.  Psychiatric/Behavioral: Negative for dysphoric mood. The patient is not  nervous/anxious.         Objective:   Physical Exam  Constitutional: She appears well-developed and well-nourished. No distress.       overwt and well appearing   HENT:  Head: Normocephalic and atraumatic.  Mouth/Throat: Oropharynx is clear and moist.  Eyes: Conjunctivae and EOM are normal. Pupils are equal, round, and reactive to light.  Neck: Normal range of motion. Neck supple. No JVD present. No thyromegaly present.  Cardiovascular: Normal rate, regular rhythm, normal heart sounds and intact distal pulses.   Pulmonary/Chest: Effort normal and breath sounds normal. No respiratory distress. She has no wheezes.  Abdominal: Soft. Bowel sounds are normal. She exhibits no distension. There is no tenderness.  Musculoskeletal:       Tender L3,4,5 spinous  processes Also tender in R paraspinal musculature without palpable spasm Flex 80 deg/ ext 10 deg with pain Nl twist Pain to flex R Neg SLR Nl rom hips and ankles   Some tenderness in arches of feet generally with nl perfusion and nl rom  No heel tenderness   Lymphadenopathy:    She has no cervical adenopathy.  Neurological: She is alert. She has normal reflexes. Coordination normal.  Skin: Skin is warm and dry. No rash noted. No erythema. No pallor.       Large tonails bilaterally thickened R has partially fallen off and L loose- there is some tenderness  Psychiatric: She has a normal mood and affect.          Assessment & Plan:

## 2011-09-15 ENCOUNTER — Other Ambulatory Visit: Payer: Self-pay | Admitting: Family Medicine

## 2011-09-15 DIAGNOSIS — Z1231 Encounter for screening mammogram for malignant neoplasm of breast: Secondary | ICD-10-CM

## 2011-09-22 ENCOUNTER — Encounter: Payer: Self-pay | Admitting: Family Medicine

## 2011-09-22 ENCOUNTER — Ambulatory Visit (INDEPENDENT_AMBULATORY_CARE_PROVIDER_SITE_OTHER): Payer: BC Managed Care – PPO | Admitting: Family Medicine

## 2011-09-22 VITALS — BP 120/70 | HR 103 | Temp 98.6°F | Ht 63.0 in | Wt 175.4 lb

## 2011-09-22 DIAGNOSIS — M549 Dorsalgia, unspecified: Secondary | ICD-10-CM

## 2011-09-22 NOTE — Progress Notes (Signed)
Subjective:    Patient ID: Mindy Long, female    DOB: July 06, 1961, 50 y.o.   MRN: [id number]  HPI  Mindy Long, a 50 y.o. female presents today in the office for the following:    50 year old patient with f/u acute on chronic LBP with LBP going back at least several years. Has been through a round of 5 ESI in [place] several years ago. Most recently, her back started to feel better and overall it is better now than before, but she has been having repetitive flares.   Currently on Prednisone 10 mg from a dose pack from Dr. Al Corpus. She did not take her burst and taper properly that I previously rx (half of pills remain) Has taken some Flexeril, Zanaflex, Vicodin, Tramadol, Gabapentin without improvement. Unclear with history - but they all made her sleepy.  It got better after prednisone burst, then it flared up again. Spoke to Dr. Milinda Antis --- Dr. Al Corpus put on prednisone. In the middle of back.   No numbness, tingling, bowel or bladder incont.  Prior OV Was in the closet, then felt some acute pain in her low back. Yesterday. Mostly in the low back.   All on the right - moving to the upper part. No lower radiculopathy  Heating pad - helped a little Ibuprofen.   Never was fully asymptomatic. Has had some LBP for a number of years -- not like this. Yesterday. 9/10 pain.   Not working.   Takes care of grandchildren: 51 and 67 yo.  No numbness or tingling   The PMH, PSH, Social History, Family History, Medications, and allergies have been reviewed in [facility], and have been updated if relevant.   Review of Systems REVIEW OF SYSTEMS  GEN: No fevers, chills. Nontoxic. Primarily MSK c/o today. MSK: Detailed in the HPI GI: tolerating PO intake without difficulty Neuro: No numbness, parasthesias, or tingling associated. Otherwise the pertinent positives of the ROS are noted above.      Objective:   Physical Exam   Physical Exam  Blood pressure 120/70, pulse 103, temperature 98.6 F  (37 C), temperature source Oral, height 5\' 3"  (1.6 m), weight 175 lb 6.4 oz (79.561 kg), SpO2 96.00%.  Gen: Well-developed,well-nourished,in no acute distress; alert,appropriate and cooperative throughout examination HEENT: Normocephalic and atraumatic without obvious abnormalities.  Ears, externally no deformities Pulm: Breathing comfortably in no respiratory distress Range of motion at  the waist: Flexion: flexion loss, 60 deg, pain Extension: painful, mild loss Lateral bending: mild loss, painful Rotation: mild pain  No echymosis or edema Rises to examination table with mild difficulty Gait: non antalgic  Inspection/Deformity: N Paraspinus T: Y - diffuse  B Ankle Dorsiflexion (L5,4): 5/5 B Great Toe Dorsiflexion (L5,4): 5/5 Heel Walk (L5): WNL Toe Walk (S1): WNL Rise/Squat (L4): WNL  SENSORY B Medial Foot (L4): WNL B Dorsum (L5): WNL B Lateral (S1): WNL Light Touch: WNL Pinprick: WNL  REFLEXES Knee (L4): 2+ Ankle (S1): 2+  B SLR, seated: neg B SLR, supine: neg B FABER: neg B Reverse FABER: TTP B Greater Troch: NT B Log Roll: neg B Stork: NT B Sciatic Notch: TTP B       Assessment & Plan:   1. BACK PAIN  Ambulatory referral to Physical Therapy, MR Lumbar Spine Wo Contrast    Failure to improve, multiple years with repetitive exacerbations. Start with conservative care, finish steroids, start McKenzie protocol PT.  Obtain MRI of the lumbar spine to evaluate for foraminal impingement and for  preprocedural standpoint for any intervention or potential epidural steroid injections.

## 2011-09-22 NOTE — Patient Instructions (Signed)
REFERRAL: GO THE THE FRONT ROOM AT THE ENTRANCE OF OUR CLINIC, NEAR CHECK IN. ASK FOR MARION. SHE WILL HELP YOU SET UP YOUR REFERRAL. DATE: TIME:  

## 2011-09-23 ENCOUNTER — Telehealth: Payer: Self-pay | Admitting: Family Medicine

## 2011-09-23 NOTE — Telephone Encounter (Signed)
Called patient to give Dr Brayton Layman instructions about the Meloxicam and Alleve.

## 2011-09-23 NOTE — Telephone Encounter (Signed)
Dr Patsy Lager, the patient is taking Meloxicam 15mg  1 per day. Is it ok to take this along with Aleze? Dr Al Corpus gave her this for her foot pain. Please advise and call her at [phone].

## 2011-09-23 NOTE — Telephone Encounter (Signed)
Call  Recommend taking neither meloxicam or alleve along with prednisone.  When she finishes the prednisone that Dr. Al Corpus placed her on, then I would take meloxicam only. (cannot take at the same time as alleve)

## 2011-09-25 ENCOUNTER — Ambulatory Visit
Admission: RE | Admit: 2011-09-25 | Discharge: 2011-09-25 | Disposition: A | Discharge status: Home / Self Care | Payer: BC Managed Care – PPO | Source: Ambulatory Visit | Attending: Family Medicine | Admitting: Family Medicine

## 2011-09-25 DIAGNOSIS — M549 Dorsalgia, unspecified: Secondary | ICD-10-CM

## 2011-09-28 ENCOUNTER — Ambulatory Visit
Admission: RE | Admit: 2011-09-28 | Discharge: 2011-09-28 | Disposition: A | Discharge status: Home / Self Care | Payer: BC Managed Care – PPO | Source: Ambulatory Visit | Attending: Family Medicine | Admitting: Family Medicine

## 2011-09-28 DIAGNOSIS — Z1231 Encounter for screening mammogram for malignant neoplasm of breast: Secondary | ICD-10-CM

## 2011-09-28 LAB — HM MAMMOGRAPHY: HM Mammogram: NORMAL

## 2011-10-02 ENCOUNTER — Encounter: Payer: Self-pay | Admitting: *Deleted

## 2011-10-08 ENCOUNTER — Ambulatory Visit: Payer: BC Managed Care – PPO | Admitting: Obstetrics and Gynecology

## 2011-10-12 ENCOUNTER — Ambulatory Visit (INDEPENDENT_AMBULATORY_CARE_PROVIDER_SITE_OTHER): Payer: BC Managed Care – PPO | Admitting: Obstetrics and Gynecology

## 2011-10-12 ENCOUNTER — Encounter: Payer: Self-pay | Admitting: Obstetrics and Gynecology

## 2011-10-12 VITALS — BP 118/84 | HR 77 | Ht 63.0 in | Wt 175.0 lb

## 2011-10-12 DIAGNOSIS — L02219 Cutaneous abscess of trunk, unspecified: Secondary | ICD-10-CM

## 2011-10-12 NOTE — Progress Notes (Signed)
  Subjective:    Patient ID: Mindy Long, female    DOB: [birth year], 50 y.o.   MRN: [id number]  HPI  50 yo presenting today for evaluation of a cyst in vaginal region. Patient states that the cyst had been present for the past 2 weeks but spontaneously ruptured a few days ago which greatly improved her discomfort. Patient states that she normally gets small bumps in her pubic region but never did they get this large or painful. Patient denies shaving the area. Patient is otherwise without any complaints and is scheduled for a physical exam with her primary care physician next week.  Review of Systems  All other systems reviewed and are negative.       Objective:   Physical Exam  Limited pelvic: Normal appearing mons pubis and vulva. No palpable masses or tenderness. No erythema, induration or drainage. Patient indicated area of cyst which is healing very well. It appeared to have been 1.5 cm in size.      Assessment & Plan:  50 yo here for evaluation of cyst in mons pubis - No evidence of active infection at this time. Area healing well. - Patient advised to return if another of such cyst developed. - Patient to follow-up with primary care physician for annual exam.

## 2011-10-22 ENCOUNTER — Telehealth: Payer: Self-pay | Admitting: Family Medicine

## 2011-10-22 DIAGNOSIS — I10 Essential (primary) hypertension: Secondary | ICD-10-CM

## 2011-10-22 DIAGNOSIS — E559 Vitamin D deficiency, unspecified: Secondary | ICD-10-CM

## 2011-10-22 DIAGNOSIS — Z0000 Encounter for general adult medical examination without abnormal findings: Secondary | ICD-10-CM

## 2011-10-22 DIAGNOSIS — E876 Hypokalemia: Secondary | ICD-10-CM

## 2011-10-22 NOTE — Telephone Encounter (Signed)
Message copied by Judy Pimple on Thu Oct 22, 2011  7:05 PM ------      Message from: Alvina Chou      Created: Thu Oct 15, 2011 10:51 AM      Regarding: Labs for Fri 11-16       Patient is scheduled for CPX labs, please order future labs, Thanks , Camelia Eng

## 2011-10-23 ENCOUNTER — Ambulatory Visit: Payer: Self-pay | Admitting: Podiatry

## 2011-10-23 ENCOUNTER — Other Ambulatory Visit (INDEPENDENT_AMBULATORY_CARE_PROVIDER_SITE_OTHER): Payer: BC Managed Care – PPO

## 2011-10-23 DIAGNOSIS — E876 Hypokalemia: Secondary | ICD-10-CM

## 2011-10-23 DIAGNOSIS — E559 Vitamin D deficiency, unspecified: Secondary | ICD-10-CM

## 2011-10-23 DIAGNOSIS — Z0000 Encounter for general adult medical examination without abnormal findings: Secondary | ICD-10-CM

## 2011-10-23 DIAGNOSIS — I10 Essential (primary) hypertension: Secondary | ICD-10-CM

## 2011-10-23 LAB — COMPREHENSIVE METABOLIC PANEL
AST: 21 U/L (ref 0–37)
Albumin: 3.8 g/dL (ref 3.5–5.2)
Alkaline Phosphatase: 97 U/L (ref 39–117)
BUN: 14 mg/dL (ref 6–23)
Potassium: 3.4 mEq/L — ABNORMAL LOW (ref 3.5–5.1)
Sodium: 140 mEq/L (ref 135–145)

## 2011-10-23 LAB — CBC WITH DIFFERENTIAL/PLATELET
Basophils Absolute: 0 10*3/uL (ref 0.0–0.1)
Basophils Relative: 0.5 % (ref 0.0–3.0)
Eosinophils Absolute: 0.1 10*3/uL (ref 0.0–0.7)
MCHC: 33.1 g/dL (ref 30.0–36.0)
MCV: 79 fl (ref 78.0–100.0)
Monocytes Absolute: 0.4 10*3/uL (ref 0.1–1.0)
Neutrophils Relative %: 44.2 % (ref 43.0–77.0)
Platelets: 270 10*3/uL (ref 150.0–400.0)
RDW: 14.6 % (ref 11.5–14.6)

## 2011-10-23 LAB — TSH: TSH: 1.77 u[IU]/mL (ref 0.35–5.50)

## 2011-10-23 LAB — LIPID PANEL
Total CHOL/HDL Ratio: 3
Triglycerides: 64 mg/dL (ref 0.0–149.0)

## 2011-11-04 ENCOUNTER — Encounter: Payer: Self-pay | Admitting: Family Medicine

## 2011-11-04 ENCOUNTER — Ambulatory Visit (INDEPENDENT_AMBULATORY_CARE_PROVIDER_SITE_OTHER): Payer: BC Managed Care – PPO | Admitting: Family Medicine

## 2011-11-04 VITALS — BP 118/82 | HR 88 | Temp 98.5°F | Ht 62.25 in | Wt 176.2 lb

## 2011-11-04 DIAGNOSIS — E876 Hypokalemia: Secondary | ICD-10-CM

## 2011-11-04 DIAGNOSIS — I10 Essential (primary) hypertension: Secondary | ICD-10-CM

## 2011-11-04 DIAGNOSIS — Z1211 Encounter for screening for malignant neoplasm of colon: Secondary | ICD-10-CM | POA: Insufficient documentation

## 2011-11-04 DIAGNOSIS — Z23 Encounter for immunization: Secondary | ICD-10-CM

## 2011-11-04 DIAGNOSIS — Z0000 Encounter for general adult medical examination without abnormal findings: Secondary | ICD-10-CM

## 2011-11-04 DIAGNOSIS — R51 Headache: Secondary | ICD-10-CM

## 2011-11-04 NOTE — Assessment & Plan Note (Signed)
Refer for screening colonoscopy 

## 2011-11-04 NOTE — Progress Notes (Signed)
Subjective:    Patient ID: Mindy Long, female    DOB: 1961-08-14, 50 y.o.   MRN: [id number]  HPI Here for annual health mt exam and to review chronic health problems   Has been doing well overall  Has some back problems - deg disc Her headaches are a bit worse and more frequent lately  In past that was related to her bp  Not really stressed   Is getting some exercise  Tries to eat healthy  Lot of night time hot flashes   Lots of foot problems - Dr Al Corpus- did MRI recent      Wt is up 1 lb with bmi of 31 HTN in good control 118/82 bp today  Goes up and down   Pap 5/09 Had total hyst with 1/2 of ovary remaining  Went to her gyn about 3 weeks ago - cyst on her ovary - that went away (had been really big) Told to f/u if it comes back again  Does need a breast exam   mammo 10/12 nl  Self exam - no lumps or changes   colonosc 1/02- for symptoms , gassiness - was normal  Does want to shedule screening colonosc  Neg  Flu shot - never had it  Wants to get that today   K 3.4 - a bit low - just borderline  Is no longer taking K  No mvi    Lipids ok  Lab Results  Component Value Date   CHOL 214* 10/23/2011   CHOL 230* 06/20/2009   CHOL 201* 01/25/2008   Lab Results  Component Value Date   HDL 81.60 10/23/2011   HDL 84.20 06/20/2009   HDL 78.2 01/25/2008   No results found for this basename: [facility]   Lab Results  Component Value Date   TRIG 64.0 10/23/2011   TRIG 43.0 06/20/2009   TRIG 44 01/25/2008   Lab Results  Component Value Date   CHOLHDL 3 10/23/2011   CHOLHDL 3 06/20/2009   CHOLHDL 2.9 CALC 01/25/2008   Lab Results  Component Value Date   LDLDIRECT 126.7 10/23/2011   LDLDIRECT 132.9 06/20/2009   LDLDIRECT 109.4 01/25/2008   is to goal and high HDL     Chemistry      Component Value Date/Time   NA 140 10/23/2011 0858   K 3.4* 10/23/2011 0858   CL 107 10/23/2011 0858   CO2 24 10/23/2011 0858   BUN 14 10/23/2011 0858   CREATININE 0.8  10/23/2011 0858      Component Value Date/Time   CALCIUM 8.9 10/23/2011 0858   ALKPHOS 97 10/23/2011 0858   AST 21 10/23/2011 0858   ALT 16 10/23/2011 0858   BILITOT 0.5 10/23/2011 0858       Patient Active Problem List  Diagnoses  . UNSPECIFIED VITAMIN D DEFICIENCY  . HYPOKALEMIA  . ESSENTIAL HYPERTENSION  . ALLERGIC RHINITIS  . G E R D  . PEPTIC ULCER DISEASE  . BACK PAIN  . PLANTAR FASCIITIS  . DISTURBANCE OF SKIN SENSATION  . HEADACHE  . PALPITATIONS  . NEPHROLITHIASIS, HX OF  . PERIMENOPAUSAL STATUS  . Foot pain  . Routine general medical examination at a health care facility  . Colon cancer screening   Past Medical History  Diagnosis Date  . Headache   . GERD (gastroesophageal reflux disease)   . Plantar fasciitis   . History of nephrolithiasis   . Ulcer     peptic ulcer disease  . Hypertension   .  Back pain    Past Surgical History  Procedure Date  . Cholecystectomy 1984  . Abdominal hysterectomy 2002    partail one ovary remains  . Oophorectomy    History  Substance Use Topics  . Smoking status: Never Smoker   . Smokeless tobacco: Not on file  . Alcohol Use: No   Family History  Problem Relation Age of Onset  . Stroke Father   . Hypertension Father   . Diabetes Father   . Cancer Brother     stomach tumor   Allergies  Allergen Reactions  . Atenolol Cough   Current Outpatient Prescriptions on File Prior to Visit  Medication Sig Dispense Refill  . amLODipine (NORVASC) 5 MG tablet Take 5 mg by mouth daily.        . Cholecalciferol (VITAMIN D3) 2000 UNITS capsule Take 2,000 Units by mouth daily.        . meloxicam (MOBIC) 15 MG tablet Take 15 mg by mouth daily.       Marland Kitchen omeprazole (PRILOSEC) 20 MG capsule Take 1 capsule (20 mg total) by mouth daily.  30 capsule  6  . SUMAtriptan Succinate (IMITREX PO) Take by mouth.           Review of Systems Review of Systems  Constitutional: Negative for fever, appetite change, fatigue and unexpected  weight change.  Eyes: Negative for pain and visual disturbance.  Respiratory: Negative for cough and shortness of breath.   Cardiovascular: Negative for cp or palpitations    Gastrointestinal: Negative for nausea, diarrhea and constipation.  Genitourinary: Negative for urgency and frequency.  Skin: Negative for pallor or rash   MSK pos for chronic foot pain  Neurological: Negative for weakness, light-headedness, numbness and pos for headaches  Hematological: Negative for adenopathy. Does not bruise/bleed easily.  Psychiatric/Behavioral: Negative for dysphoric mood. The patient is not nervous/anxious.          Objective:   Physical Exam  Constitutional: She appears well-developed and well-nourished. No distress.       overwt and well appearing   HENT:  Head: Normocephalic and atraumatic.  Mouth/Throat: Oropharynx is clear and moist.  Eyes: Conjunctivae and EOM are normal. Pupils are equal, round, and reactive to light. No scleral icterus.  Neck: Normal range of motion. Neck supple. No JVD present. Carotid bruit is not present. No thyromegaly present.  Cardiovascular: Normal rate, regular rhythm, normal heart sounds and intact distal pulses.  Exam reveals no gallop.   Pulmonary/Chest: Effort normal and breath sounds normal. No respiratory distress. She has no wheezes. She exhibits no tenderness.  Abdominal: Soft. Bowel sounds are normal. She exhibits no distension, no abdominal bruit and no mass. There is no tenderness.  Genitourinary: No breast swelling, tenderness, discharge or bleeding.       No breast M noted   Musculoskeletal: Normal range of motion. She exhibits no edema.  Lymphadenopathy:    She has no cervical adenopathy.  Neurological: She is alert. She has normal reflexes. No cranial nerve deficit. She exhibits normal muscle tone. Coordination normal.  Skin: Skin is warm and dry. No rash noted. No erythema. No pallor.  Psychiatric: She has a normal mood and affect.           Assessment & Plan:

## 2011-11-04 NOTE — Assessment & Plan Note (Signed)
bp in fair control at this time  No changes needed  Disc lifstyle change with low sodium diet and exercise   Will continue amlodipine

## 2011-11-04 NOTE — Assessment & Plan Note (Signed)
Reviewed health habits including diet and exercise and skin cancer prevention Also reviewed health mt list, fam hx and immunizations   Rev wellness labs today in detail  Disc healthy diet and exercise  Flu shot today

## 2011-11-04 NOTE — Assessment & Plan Note (Signed)
K down to 3.4 since she stopped eating bananas Adv to go back to one banana per day in addn to [facility] daily  Update if cramps or any other symptoms

## 2011-11-04 NOTE — Patient Instructions (Addendum)
We will schedule colonoscopy at check out  Flu shot today  To get potassium up - go back to eating one small banana per day and also take one multivitaimin per day  Try to keep up with healthy diet and exercise Labs are ok  See your gynecologist if pelvic pain comes back  For headaches drink lots of fluids/ avoid caffeine/ get good regular sleep - I think hormones are playing a role

## 2011-11-04 NOTE — Assessment & Plan Note (Signed)
A bit more frequent with menopause  Disc use of aleve / fluids/ caff avoidance/ lifestyle change - and given handout If worse can disc ha prophylaxis

## 2011-11-10 ENCOUNTER — Encounter: Payer: Self-pay | Admitting: Internal Medicine

## 2011-12-04 ENCOUNTER — Ambulatory Visit (AMBULATORY_SURGERY_CENTER): Payer: BC Managed Care – PPO | Admitting: *Deleted

## 2011-12-04 VITALS — Ht 63.0 in | Wt 170.0 lb

## 2011-12-04 DIAGNOSIS — Z1211 Encounter for screening for malignant neoplasm of colon: Secondary | ICD-10-CM

## 2011-12-04 MED ORDER — PEG-KCL-NACL-NASULF-NA ASC-C 100 G PO SOLR: ORAL | Status: DC

## 2011-12-14 ENCOUNTER — Encounter: Payer: Self-pay | Admitting: Family Medicine

## 2011-12-14 ENCOUNTER — Ambulatory Visit (INDEPENDENT_AMBULATORY_CARE_PROVIDER_SITE_OTHER): Payer: BC Managed Care – PPO | Admitting: Family Medicine

## 2011-12-14 DIAGNOSIS — H8110 Benign paroxysmal vertigo, unspecified ear: Secondary | ICD-10-CM

## 2011-12-14 DIAGNOSIS — R51 Headache: Secondary | ICD-10-CM

## 2011-12-14 NOTE — Assessment & Plan Note (Signed)
Positive Gilberto Better in office. Treat with home EPlY  exercise info given. Discussed meclizine bandaid measure  that can cause sleepiness.. Will hold off.

## 2011-12-14 NOTE — Assessment & Plan Note (Signed)
No red flags , normal neuro exam. Not exactly meeting qualifications for migraine (no phono-photophobia, some nausea but more associated with vertigo) .. ? Tension headache? Use tramadol prn pain. Follow up with Dr. Karie Schwalbe is not improving.

## 2011-12-14 NOTE — Progress Notes (Signed)
Subjective:    Patient ID: Mindy Long, female    DOB: Aug 31, 1961, 51 y.o.   MRN: [id number]  HPI  51 year old female pt of Dr. Milinda Antis with history of HTN(well controlled) presents with continued headaches and feeling off balance. Noted 4 days ago.. Had bad headache in center of forehead. Followed by vertigo senstations when she closes her eyes. Feels queasy when room spinning sensation.  No photophonophobia. Did also note sensation of music playing in left ear woke her up.. No heart beat sound, not really ringing in ears. Lasted few minutes. Feel like when walking she is off balance. No hearing loss.  Took tramadol/ibuprofen for headache...headache goes away but then returns. Headache currently 8/10 on pain scale.  Left external ear tender to touch. Mild congestion, no cough.  Last OV with Dr. Karie Schwalbe 11/28 for CPX. Has been having headache 2-3 days a week.    Review of Systems  Constitutional: Negative for fever and fatigue.  HENT: Negative for ear pain.   Eyes: Negative for pain.  Respiratory: Negative for chest tightness and shortness of breath.   Cardiovascular: Negative for chest pain, palpitations and leg swelling.  Gastrointestinal: Negative for abdominal pain.  Genitourinary: Negative for dysuria.       Objective:   Physical Exam  Constitutional: She is oriented to person, place, and time. Vital signs are normal. She appears well-developed and well-nourished. She is cooperative.  Non-toxic appearance. She does not appear ill. No distress.  HENT:  Head: Normocephalic.  Right Ear: Hearing, tympanic membrane, external ear and ear canal normal. Tympanic membrane is not erythematous, not retracted and not bulging.  Left Ear: Hearing, tympanic membrane, external ear and ear canal normal. Tympanic membrane is not erythematous, not retracted and not bulging.  Nose: No mucosal edema or rhinorrhea. Right sinus exhibits no maxillary sinus tenderness and no frontal sinus tenderness.  Left sinus exhibits no maxillary sinus tenderness and no frontal sinus tenderness.  Mouth/Throat: Uvula is midline, oropharynx is clear and moist and mucous membranes are normal.  Eyes: Conjunctivae, EOM and lids are normal. Pupils are equal, round, and reactive to light. No foreign bodies found.  Fundoscopic exam:      The right eye shows no papilledema.       The left eye shows no papilledema.  Neck: Trachea normal and normal range of motion. Neck supple. Carotid bruit is not present. No mass and no thyromegaly present.  Cardiovascular: Normal rate, regular rhythm, S1 normal, S2 normal, normal heart sounds, intact distal pulses and normal pulses.  Exam reveals no gallop and no friction rub.   No murmur heard. Pulmonary/Chest: Effort normal and breath sounds normal. Not tachypneic. No respiratory distress. She has no decreased breath sounds. She has no wheezes. She has no rhonchi. She has no rales.  Abdominal: Soft. Normal appearance and bowel sounds are normal. There is no tenderness.  Neurological: She is alert and oriented to person, place, and time. She has normal strength and normal reflexes. She displays normal reflexes. No cranial nerve deficit or sensory deficit. She exhibits normal muscle tone. Coordination normal.       Pos Dix Hallpike maneuver  Skin: Skin is warm, dry and intact. No rash noted.  Psychiatric: Her speech is normal and behavior is normal. Judgment and thought content normal. Her mood appears not anxious. Cognition and memory are normal. She does not exhibit a depressed mood.       Affect somewhat flat  Assessment & Plan:

## 2011-12-14 NOTE — Patient Instructions (Addendum)
Most likely benign paroxsymal positional vertigo See info packet.. Start home exercises to treat vertigo. May use tramadol for headache pain. Make appt for follow up with Dr. Milinda Antis in 2 weeks for reassessment of frequent headaches if not improving. Call sooner if new neurologic symptoms, hearing changes.

## 2011-12-18 ENCOUNTER — Encounter: Payer: Self-pay | Admitting: Internal Medicine

## 2011-12-18 ENCOUNTER — Telehealth: Payer: Self-pay

## 2011-12-18 ENCOUNTER — Ambulatory Visit (AMBULATORY_SURGERY_CENTER): Payer: BC Managed Care – PPO | Admitting: Internal Medicine

## 2011-12-18 VITALS — BP 135/94 | HR 107 | Temp 95.6°F | Resp 20 | Ht 63.0 in | Wt 172.0 lb

## 2011-12-18 DIAGNOSIS — Z1211 Encounter for screening for malignant neoplasm of colon: Secondary | ICD-10-CM

## 2011-12-18 HISTORY — PX: COLONOSCOPY: SHX174

## 2011-12-18 MED ORDER — SODIUM CHLORIDE 0.9 % IV SOLN: 500.0000 mL | INTRAVENOUS | Status: DC

## 2011-12-18 NOTE — Op Note (Addendum)
[facility] [address] [place], [place]  [address]  COLONOSCOPY PROCEDURE REPORT  PATIENT:  Mindy Long, Mindy Long  MR#:  [id number] BIRTHDATE:  06/26/1961, 50 yrs. old  GENDER:  female ENDOSCOPIST:  Iva Boop, MD, [facility] REF. BY:  Marne A. Milinda Antis, M.D. PROCEDURE DATE:  12/18/2011 PROCEDURE:  Colonoscopy 19147 ASA CLASS:  Class II INDICATIONS:  Routine Risk Screening MEDICATIONS:   These medications were titrated to patient response per physician's verbal order, Fentanyl 75 mcg IV, Versed 7 mg IV  DESCRIPTION OF PROCEDURE:   After the risks benefits and alternatives of the procedure were thoroughly explained, informed consent was obtained.  Digital rectal exam was performed and revealed no abnormalities.   The LB CF-[id number] endoscope was introduced through the anus and advanced to the cecum, which was identified by both the appendix and ileocecal valve, without limitations.  The quality of the prep was good, using MoviPrep. The instrument was then slowly withdrawn as the colon was fully examined. <<PROCEDUREIMAGES>>  FINDINGS:  A normal appearing cecum, ileocecal valve, and appendiceal orifice were identified. The ascending, hepatic flexure, transverse, splenic flexure, descending, sigmoid colon, and rectum appeared unremarkable.   Retroflexed views in the rectum revealed no abnormalities.    The time to cecum = 8:35 minutes. The scope was then withdrawn in 14:04 minutes from the cecum and the procedure completed. COMPLICATIONS:  None ENDOSCOPIC IMPRESSION: 1) Normal colonoscopy, good prep  REPEAT EXAM:  In for routine screening colonoscopy in 10 years.  Iva Boop, MD, [facility]  CC:  Judy Pimple, MD and The Patient  n. REVISED:  12/18/2011 09:15 AM eSIGNED:   Iva Boop at 12/18/2011 09:15 AM  Bridgett Larsson, [phone]

## 2011-12-18 NOTE — Progress Notes (Signed)
Patient did not experience any of the following events: a burn prior to discharge; a fall within the facility; wrong site/side/patient/procedure/implant event; or a hospital transfer or hospital admission upon discharge from the facility. (G8907) Patient did not have preoperative order for IV antibiotic SSI prophylaxis. (G8918)  

## 2011-12-18 NOTE — Patient Instructions (Addendum)
Your colonoscopy was normal, with a good prep!. Next routine colonoscopy in 10 years. Mindy Boop, MD, [facility] Discharge instructions given with verbal understanding. Resume previous medications.

## 2011-12-18 NOTE — Telephone Encounter (Signed)
Pt is in school and needs to take a well and fitness class. Since pt is on BP med needs clearance letter that pt can participate in class doing push ups, sit ups, walking on treadmill ( all at pts own pace). Pt last seen 11/04/11. Pt can be reached at [phone].Please advise. (pt said next week would be OK to pick up letter.)

## 2011-12-18 NOTE — Telephone Encounter (Signed)
No restrictions for fitness class Will draft letter in IN box

## 2011-12-21 ENCOUNTER — Telehealth: Payer: Self-pay | Admitting: *Deleted

## 2011-12-21 NOTE — Telephone Encounter (Signed)

## 2011-12-25 NOTE — Telephone Encounter (Signed)
Left vm for pt to callback 

## 2011-12-29 ENCOUNTER — Encounter: Payer: Self-pay | Admitting: Family Medicine

## 2011-12-29 NOTE — Telephone Encounter (Signed)
Patient notified as instructed by telephone. Pt asked me to hold letter at my desk because she is seeing Dr Milinda Antis tomorrow.

## 2011-12-30 ENCOUNTER — Ambulatory Visit (INDEPENDENT_AMBULATORY_CARE_PROVIDER_SITE_OTHER): Payer: BC Managed Care – PPO | Admitting: Family Medicine

## 2011-12-30 ENCOUNTER — Encounter: Payer: Self-pay | Admitting: Family Medicine

## 2011-12-30 VITALS — BP 124/82 | HR 84 | Temp 98.3°F | Wt 172.2 lb

## 2011-12-30 DIAGNOSIS — H8110 Benign paroxysmal vertigo, unspecified ear: Secondary | ICD-10-CM

## 2011-12-30 DIAGNOSIS — R51 Headache: Secondary | ICD-10-CM

## 2011-12-30 NOTE — Assessment & Plan Note (Addendum)
Chronic headache now worse after menopause - but also notable she has been on omeprazole for a while  Will hold that since side eff is ha and update me on tues (early if gi symptoms return) May need to change to prevacid/ aciphex or dexilant Handout given on lifestyle If not imp -may need to consider prophylaxis  >25 min spent with face to face with patient, >50% counseling and/or coordinating care

## 2011-12-30 NOTE — Progress Notes (Signed)
Subjective:    Patient ID: Mindy Long, female    DOB: 07-17-1961, 51 y.o.   MRN: [id number]  HPI Here for f/u of positional vertigo and also headaches  Saw Dr Ermalene Searing early this mo  Did try some maneuvers /exercises for vertigo at home Is not as dizzy now  No nasal symptoms  Now is on and off - much milder   Headaches more frequent Now is getting them 3 times per week  Given tramadol - not really helping much  Headache lasts about a day , and no specific time  Is right in the middle occ  On one side  Constant pain - not really throbbing  No triggers  Not exertional at all  No eye strain or neck strain -- last eye exam last year was ok  Lately very sensitive to sound - but not light  Has had headaches for long time - worse after menopause On prilosec -- has been about 21/2 months      Is having a lot of hot flashes with menopause  ? If headaches are related to menopause too  Patient Active Problem List  Diagnoses  . UNSPECIFIED VITAMIN D DEFICIENCY  . HYPOKALEMIA  . ESSENTIAL HYPERTENSION  . ALLERGIC RHINITIS  . G E R D  . PEPTIC ULCER DISEASE  . BACK PAIN  . PLANTAR FASCIITIS  . DISTURBANCE OF SKIN SENSATION  . HEADACHE  . PALPITATIONS  . NEPHROLITHIASIS, HX OF  . PERIMENOPAUSAL STATUS  . Foot pain  . Routine general medical examination at a health care facility  . Colon cancer screening  . BPPV (benign paroxysmal positional vertigo)   Past Medical History  Diagnosis Date  . Headache   . GERD (gastroesophageal reflux disease)   . Plantar fasciitis   . History of nephrolithiasis   . Hypertension   . Back pain   . Ulcer 1980's    peptic ulcer disease   Past Surgical History  Procedure Date  . Cholecystectomy 1984  . Abdominal hysterectomy 2002    partail one ovary remains  . Oophorectomy   . Colonoscopy 12/18/2011    normal   History  Substance Use Topics  . Smoking status: Never Smoker   . Smokeless tobacco: Never Used  . Alcohol Use: No     Family History  Problem Relation Age of Onset  . Stroke Father   . Hypertension Father   . Diabetes Father   . Cancer Brother     stomach tumor   Allergies  Allergen Reactions  . Atenolol Cough   Current Outpatient Prescriptions on File Prior to Visit  Medication Sig Dispense Refill  . amLODipine (NORVASC) 5 MG tablet Take 5 mg by mouth daily.        . Cholecalciferol (VITAMIN D3) 2000 UNITS capsule Take 2,000 Units by mouth daily.        . traMADol (ULTRAM) 50 MG tablet Take 1 tablet by mouth Every 6 hours as needed. Takes 1-2 tablets every 6-8 hours as needed for pain      . Potassium Chloride Crys CR (KLOR-CON M20 PO) Take 1 tablet by mouth daily.         Review of Systems Review of Systems  Constitutional: Negative for fever, appetite change, fatigue and unexpected weight change.  Eyes: Negative for pain and visual disturbance.  Respiratory: Negative for cough and shortness of breath.   Cardiovascular: Negative for cp or palpitations    Gastrointestinal: Negative for nausea, diarrhea and constipation.  Genitourinary: Negative for urgency and frequency.  Skin: Negative for pallor or rash   Neurological: Negative for weakness and numbness and pos for headaches  Hematological: Negative for adenopathy. Does not bruise/bleed easily.  Psychiatric/Behavioral: Negative for dysphoric mood. The patient is not nervous/anxious.          Objective:   Physical Exam  Constitutional: She appears well-developed and well-nourished. No distress.  HENT:  Head: Normocephalic and atraumatic.  Right Ear: External ear normal.  Left Ear: External ear normal.  Nose: Nose normal.  Mouth/Throat: Oropharynx is clear and moist.       No sinus tenderness  Eyes: Conjunctivae and EOM are normal. Pupils are equal, round, and reactive to light.       1-2 beats of horiz nystagmus bilat  Neck: Normal range of motion. Neck supple. No JVD present. Carotid bruit is not present. No thyromegaly  present.  Cardiovascular: Normal rate, regular rhythm, normal heart sounds and intact distal pulses.   No murmur heard. Pulmonary/Chest: Effort normal and breath sounds normal. No respiratory distress. She has no wheezes.  Abdominal: Soft. Bowel sounds are normal.  Musculoskeletal: She exhibits tenderness. She exhibits no edema.       Some neck muscular tenderness and tightness  Lymphadenopathy:    She has no cervical adenopathy.  Neurological: She is alert. She has normal reflexes. She displays no atrophy and no tremor. No cranial nerve deficit or sensory deficit. She exhibits normal muscle tone. She displays a negative Romberg sign. Coordination and gait normal.       Gait is normal today- pt doe try to change position slowly  Skin: Skin is warm and dry. No rash noted. No erythema. No pallor.  Psychiatric: She has a normal mood and affect.          Assessment & Plan:

## 2011-12-30 NOTE — Patient Instructions (Addendum)
I'm glad the vertigo is improving - let me know if any changes or not continuing to improve in next 1-2 weeks Stop your omeprazole (that is prilosec) - call Tuesday and let me know how you are doing with headaches We may transition you to another acid reflux medicine  If headaches do not improve - we may want to start a daily preventative medication At any point if worse- call and let me know

## 2011-12-31 NOTE — Assessment & Plan Note (Signed)
This is much improved - per pt almost gone  I suspect this was viral, and enc her to continue her exercises If worse however , inst her to f/u

## 2012-01-05 ENCOUNTER — Telehealth: Payer: Self-pay | Admitting: Family Medicine

## 2012-01-05 NOTE — Telephone Encounter (Signed)
Patient notified as instructed by telephone. 

## 2012-01-05 NOTE — Telephone Encounter (Signed)
Good - stay off the prilosec another week and then update me again re: how are headaches, and also if she is having reflux without med Will make a plan from there Glad she is doing better

## 2012-01-05 NOTE — Telephone Encounter (Signed)
Pt called, says her headaches are some better. They have not gone completely, but better.

## 2012-01-13 ENCOUNTER — Telehealth: Payer: Self-pay | Admitting: Family Medicine

## 2012-01-13 NOTE — Telephone Encounter (Signed)
Spoke with pt  On 12/12/11 pt had a dull headache all day and felt "spacey" or forgetful. Pt took Tramadol and that helped. (pt said she does not need refill on Tramadol she is OK with quantity that she has.) For the past week every 2-3 days pt will have sharp pain in forehead that only last for few seconds. Pt said she is usually watching TV when the quick epidsodes of h/a occur. No dizziness and no real difference with h/a in relationship to light or noise. Pt uses [facility] if pharmacy needed. Pt can be reached at [phone].

## 2012-01-13 NOTE — Telephone Encounter (Signed)
This sounds better than what she is having - I'm glad, so lets keep an eye on it  If this does not continue to improve or if head pain worsens - please let me know and follow up (also if the fleeting pains become more frequent )

## 2012-01-13 NOTE — Telephone Encounter (Signed)
Left vm for pt to callback 

## 2012-01-14 ENCOUNTER — Telehealth: Payer: Self-pay | Admitting: Family Medicine

## 2012-01-14 NOTE — Telephone Encounter (Signed)
Patient notified as instructed by telephone. 

## 2012-01-14 NOTE — Telephone Encounter (Signed)
Left vm for pt to callback 

## 2012-01-14 NOTE — Telephone Encounter (Signed)
Spoke with pt and she had already talked with me and did not need another call back.

## 2012-01-14 NOTE — Telephone Encounter (Signed)
Spoke with pt earlier this AM but left v/m for pt to call back.

## 2012-01-26 ENCOUNTER — Other Ambulatory Visit: Payer: Self-pay

## 2012-01-26 MED ORDER — AMLODIPINE BESYLATE 5 MG PO TABS: 5.0000 mg | ORAL_TABLET | Freq: Every day | ORAL | Status: DC

## 2012-01-26 NOTE — Telephone Encounter (Signed)
Will refill electronically  

## 2012-01-26 NOTE — Telephone Encounter (Signed)
Fax prescription refill received from pharmacy. Is it okay to refill?

## 2012-04-11 ENCOUNTER — Ambulatory Visit (INDEPENDENT_AMBULATORY_CARE_PROVIDER_SITE_OTHER): Payer: BC Managed Care – PPO | Admitting: Family Medicine

## 2012-04-11 ENCOUNTER — Encounter: Payer: Self-pay | Admitting: Family Medicine

## 2012-04-11 VITALS — BP 102/78 | HR 76 | Temp 97.9°F | Ht 62.25 in

## 2012-04-11 DIAGNOSIS — R51 Headache: Secondary | ICD-10-CM

## 2012-04-11 MED ORDER — GABAPENTIN 300 MG PO CAPS: 300.0000 mg | ORAL_CAPSULE | Freq: Three times a day (TID) | ORAL | Status: DC

## 2012-04-11 NOTE — Progress Notes (Signed)
Subjective:    Patient ID: Mindy Long, female    DOB: [birth year], 51 y.o.   MRN: [id number]  HPI Here for headaches  Last time she was here we stopped the prilosec (has a side effect of headache)  Then she improved about 3-4 weeks   Then started getting worse again  Now getting headaches almost every day  Sometimes is severe- has to lie down and position head a certain way   Once had a cold sensation on R side of head  No aura- vision  Headaches are more on R side - with some scalp tenderness  Does throb No nausea  No sens to light or sound   Tramadol puts her to sleep but she still wakes up with headache  Aleve helps for a while and ha comes right back   In terms of menopause- had hyst - does have hot flashes   Caffeine - 1 drink every 3 d or so  Gets a good amt of sleep  Not a lot of stress - every day things   Did not have migraine when younger  No fam hx  Mood has been ok   Patient Active Problem List  Diagnoses  . UNSPECIFIED VITAMIN D DEFICIENCY  . HYPOKALEMIA  . ESSENTIAL HYPERTENSION  . ALLERGIC RHINITIS  . G E R D  . PEPTIC ULCER DISEASE  . BACK PAIN  . PLANTAR FASCIITIS  . DISTURBANCE OF SKIN SENSATION  . HEADACHE  . PALPITATIONS  . NEPHROLITHIASIS, HX OF  . PERIMENOPAUSAL STATUS  . Foot pain  . Routine general medical examination at a health care facility  . Colon cancer screening  . BPPV (benign paroxysmal positional vertigo)   Past Medical History  Diagnosis Date  . Headache   . GERD (gastroesophageal reflux disease)   . Plantar fasciitis   . History of nephrolithiasis   . Hypertension   . Back pain   . Ulcer 1980's    peptic ulcer disease   Past Surgical History  Procedure Date  . Cholecystectomy 1984  . Abdominal hysterectomy 2002    partail one ovary remains  . Oophorectomy   . Colonoscopy 12/18/2011    normal   History  Substance Use Topics  . Smoking status: Never Smoker   . Smokeless tobacco: Never Used  . Alcohol Use:  No   Family History  Problem Relation Age of Onset  . Stroke Father   . Hypertension Father   . Diabetes Father   . Cancer Brother     stomach tumor   Allergies  Allergen Reactions  . Atenolol Cough   Current Outpatient Prescriptions on File Prior to Visit  Medication Sig Dispense Refill  . amLODipine (NORVASC) 5 MG tablet Take 1 tablet (5 mg total) by mouth daily.  30 tablet  5  . Cholecalciferol (VITAMIN D3) 2000 UNITS capsule Take 2,000 Units by mouth daily.        . traMADol (ULTRAM) 50 MG tablet Take 1 tablet by mouth Every 6 hours as needed. Takes 1-2 tablets every 6-8 hours as needed for pain      . gabapentin (NEURONTIN) 300 MG capsule Take 1 capsule (300 mg total) by mouth 3 (three) times daily.  90 capsule  3       Review of Systems Review of Systems  Constitutional: Negative for fever, appetite change, fatigue and unexpected weight change.  Eyes: Negative for pain and visual disturbance.  Respiratory: Negative for cough and shortness of breath.  Cardiovascular: Negative for cp or palpitations    Gastrointestinal: Negative for nausea, diarrhea and constipation.  Genitourinary: Negative for urgency and frequency. pos for menopausal hot flashes  Skin: Negative for pallor or rash   Neurological: Negative for weakness, light-headedness, numbness and pos for headaches  Hematological: Negative for adenopathy. Does not bruise/bleed easily.  Psychiatric/Behavioral: Negative for dysphoric mood. The patient is not nervous/anxious.  (mood is good)        Objective:   Physical Exam  Constitutional: She appears well-developed and well-nourished. No distress.       overwt and well appearing   HENT:  Head: Normocephalic and atraumatic.  Right Ear: External ear normal.  Left Ear: External ear normal.  Mouth/Throat: Oropharynx is clear and moist.       No sinus or temporal tenderness  Eyes: Conjunctivae and EOM are normal. Pupils are equal, round, and reactive to light.  Right eye exhibits no discharge. Left eye exhibits no discharge. No scleral icterus.       Fundi grossly wnl bilat   Neck: Normal range of motion. Neck supple. No JVD present. No thyromegaly present.  Cardiovascular: Normal rate, regular rhythm and normal heart sounds.   No murmur heard. Pulmonary/Chest: Effort normal and breath sounds normal. No respiratory distress. She has no wheezes.  Musculoskeletal: She exhibits no edema.  Lymphadenopathy:    She has no cervical adenopathy.  Neurological: She is alert. She has normal strength and normal reflexes. She displays no atrophy and no tremor. No cranial nerve deficit or sensory deficit. She exhibits normal muscle tone. Coordination and gait normal.       No focal cerebellar signs Nl gait No facial droop  Speech is normal  Has headache today- mild  Skin: Skin is warm and dry. No rash noted. No erythema. No pallor.  Psychiatric: She has a normal mood and affect.          Assessment & Plan:

## 2012-04-11 NOTE — Assessment & Plan Note (Signed)
I feel her headaches are transforming to chronic daily migraine - with most pain on R and migraine features  Also ? If some analgesic rebound from aleve and ultram She is overall good with lifestyle change Also reassuring exam  Will try prophylaxis Gabapentin 300 mg - start with qd and then inc to bid (later tid if tol)- and will titrate Disc side eff in detail  Disc red flags for both side eff and neurol changes/ worse ha F/u 4-6 wk Given handout on recurrent migraine

## 2012-04-11 NOTE — Patient Instructions (Addendum)
Start gabapentin 300 mg one pill at night  Then after about 2 weeks increase to 1 am and 1 pm  Follow up with me in 4-6 weeks - if this is starting to help - we will add a 3rd dose  You may feel sleepy while you get used to it - update me if that does not improve or if you have other side effects

## 2012-06-17 ENCOUNTER — Telehealth: Payer: Self-pay | Admitting: Family Medicine

## 2012-06-17 NOTE — Telephone Encounter (Signed)
Caller: Tarrin/Patient; PCP: Roxy Manns A.; CB#: [phone]; ; ; Call regarding Medication Question;  States MD stopped potassium "a while ago" and is having similar symptoms that had when started taking medicine. Has been feeling more tired for 1 week. Afebrile. Wants to know if MD will prescribe medicine now. Advised needs appointment prior to refilling. Appointment scheduled for 7-15 at 1015.

## 2012-06-17 NOTE — Telephone Encounter (Signed)
Will see her on 7/15 and check a level- if K is low we will certainly start back on it  She can eat high potassium foods - banana/ OJ/ cantelope as well

## 2012-06-20 ENCOUNTER — Encounter: Payer: Self-pay | Admitting: Family Medicine

## 2012-06-20 ENCOUNTER — Ambulatory Visit (INDEPENDENT_AMBULATORY_CARE_PROVIDER_SITE_OTHER): Payer: BC Managed Care – PPO | Admitting: Family Medicine

## 2012-06-20 VITALS — BP 118/65 | HR 76 | Temp 98.2°F | Ht 63.0 in | Wt 168.5 lb

## 2012-06-20 DIAGNOSIS — R5383 Other fatigue: Secondary | ICD-10-CM

## 2012-06-20 DIAGNOSIS — R5381 Other malaise: Secondary | ICD-10-CM

## 2012-06-20 LAB — COMPREHENSIVE METABOLIC PANEL
ALT: 22 U/L (ref 0–35)
Albumin: 3.6 g/dL (ref 3.5–5.2)
Alkaline Phosphatase: 95 U/L (ref 39–117)
CO2: 25 mEq/L (ref 19–32)
Glucose, Bld: 98 mg/dL (ref 70–99)
Potassium: 3.6 mEq/L (ref 3.5–5.1)
Sodium: 140 mEq/L (ref 135–145)
Total Bilirubin: 0.5 mg/dL (ref 0.3–1.2)
Total Protein: 7.1 g/dL (ref 6.0–8.3)

## 2012-06-20 LAB — CBC WITH DIFFERENTIAL/PLATELET
Basophils Absolute: 0 10*3/uL (ref 0.0–0.1)
Eosinophils Relative: 2.5 % (ref 0.0–5.0)
Monocytes Absolute: 0.4 10*3/uL (ref 0.1–1.0)
Monocytes Relative: 9.9 % (ref 3.0–12.0)
Neutrophils Relative %: 42.1 % — ABNORMAL LOW (ref 43.0–77.0)
Platelets: 215 10*3/uL (ref 150.0–400.0)
RDW: 14.4 % (ref 11.5–14.6)
WBC: 4.1 10*3/uL — ABNORMAL LOW (ref 4.5–10.5)

## 2012-06-20 LAB — TSH: TSH: 1.59 u[IU]/mL (ref 0.35–5.50)

## 2012-06-20 NOTE — Assessment & Plan Note (Signed)
Intermittent/ worse for 2 weeks  Pt does incidentally have snoring that wakes her up occ so apnea is in the differential  Will check labs ,pt has been hypokalemic in past -does try to eat balanced diet Disc exercise No other symptoms

## 2012-06-20 NOTE — Patient Instructions (Addendum)
Doing labs for fatigue today including potassium  Try to eat a balanced diet and exercise every day  We may want to consider evaluation for sleep apnea if all comes back ok

## 2012-06-20 NOTE — Progress Notes (Signed)
Subjective:    Patient ID: Mindy Long, female    DOB: [birth year], 51 y.o.   MRN: [id number]  HPI A little over a week is really tired - on and off In past this happened when K was low No cramping   No other symptoms  Just a little queasy at times  No chance pregnant   Headaches are less frequent thankfully  She does snore In fact, wakes herself up snoring  No witnessed apnea She tends to wake up ok but quickly gets sleepy and fatigued as the day goes on  Patient Active Problem List  Diagnosis  . UNSPECIFIED VITAMIN D DEFICIENCY  . HYPOKALEMIA  . ESSENTIAL HYPERTENSION  . ALLERGIC RHINITIS  . G E R D  . PEPTIC ULCER DISEASE  . BACK PAIN  . PLANTAR FASCIITIS  . DISTURBANCE OF SKIN SENSATION  . Headache, chronic daily  . PALPITATIONS  . NEPHROLITHIASIS, HX OF  . PERIMENOPAUSAL STATUS  . Foot pain  . Routine general medical examination at a health care facility  . Colon cancer screening  . BPPV (benign paroxysmal positional vertigo)   Past Medical History  Diagnosis Date  . Headache   . GERD (gastroesophageal reflux disease)   . Plantar fasciitis   . History of nephrolithiasis   . Hypertension   . Back pain   . Ulcer 1980's    peptic ulcer disease   Past Surgical History  Procedure Date  . Cholecystectomy 1984  . Abdominal hysterectomy 2002    partail one ovary remains  . Oophorectomy   . Colonoscopy 12/18/2011    normal   History  Substance Use Topics  . Smoking status: Never Smoker   . Smokeless tobacco: Never Used  . Alcohol Use: No   Family History  Problem Relation Age of Onset  . Stroke Father   . Hypertension Father   . Diabetes Father   . Cancer Brother     stomach tumor   Allergies  Allergen Reactions  . Atenolol Cough   Current Outpatient Prescriptions on File Prior to Visit  Medication Sig Dispense Refill  . amLODipine (NORVASC) 5 MG tablet Take 1 tablet (5 mg total) by mouth daily.  30 tablet  5  . Cholecalciferol (VITAMIN D3)  2000 UNITS capsule Take 2,000 Units by mouth daily.        Marland Kitchen gabapentin (NEURONTIN) 300 MG capsule Take 1 capsule (300 mg total) by mouth 3 (three) times daily.  90 capsule  3  . traMADol (ULTRAM) 50 MG tablet Take 1 tablet by mouth Every 6 hours as needed. Takes 1-2 tablets every 6-8 hours as needed for pain          Review of Systems Review of Systems  Constitutional: Negative for fever, appetite change,  and unexpected weight change.  Eyes: Negative for pain and visual disturbance.  Respiratory: Negative for cough and shortness of breath.   Cardiovascular: Negative for cp or palpitations    Gastrointestinal: Negative for , diarrhea and constipation. or abd pain  Genitourinary: Negative for urgency and frequency.  Skin: Negative for pallor or rash   Neurological: Negative for weakness, light-headedness, numbness and headaches.  Hematological: Negative for adenopathy. Does not bruise/bleed easily.  Psychiatric/Behavioral: Negative for dysphoric mood. The patient is not nervous/anxious.  -not a lot of stress right now        Objective:   Physical Exam  Constitutional: She appears well-developed and well-nourished. No distress.       overwt and  well appearing   HENT:  Head: Normocephalic and atraumatic.  Mouth/Throat: Oropharynx is clear and moist.  Eyes: Conjunctivae and EOM are normal. Pupils are equal, round, and reactive to light. Right eye exhibits no discharge. Left eye exhibits no discharge.  Neck: Normal range of motion. Neck supple. No JVD present. Carotid bruit is not present. No thyromegaly present.  Cardiovascular: Normal rate, regular rhythm and intact distal pulses.   Pulmonary/Chest: Effort normal and breath sounds normal. No respiratory distress. She has no wheezes.  Abdominal: Soft. Bowel sounds are normal. She exhibits no distension and no abdominal bruit. There is no tenderness.  Musculoskeletal: She exhibits no edema and no tenderness.       No acute joint changes    Lymphadenopathy:    She has no cervical adenopathy.  Neurological: She is alert. She has normal reflexes. No cranial nerve deficit. She exhibits normal muscle tone. Coordination normal.  Skin: Skin is warm and dry. No rash noted. No erythema. No pallor.  Psychiatric: She has a normal mood and affect.          Assessment & Plan:

## 2012-06-21 ENCOUNTER — Telehealth: Payer: Self-pay | Admitting: Family Medicine

## 2012-06-21 DIAGNOSIS — R5383 Other fatigue: Secondary | ICD-10-CM

## 2012-06-21 DIAGNOSIS — R0683 Snoring: Secondary | ICD-10-CM

## 2012-06-21 NOTE — Telephone Encounter (Signed)
Will ref to pulm/ sleep clinic

## 2012-06-21 NOTE — Telephone Encounter (Signed)
Message copied by Judy Pimple on Tue Jun 21, 2012  1:54 PM ------      Message from: Willey Blade      Created: Tue Jun 21, 2012 10:24 AM       Informed patient of results. She is interested in apnea referral.

## 2012-07-15 ENCOUNTER — Telehealth: Payer: Self-pay

## 2012-07-15 NOTE — Telephone Encounter (Signed)
I have tramadol on her med list from the past -- did that work for her in the past ? -let me know  Also is she seeing a specialist for her knee- if not will need f/u to see what is going on

## 2012-07-15 NOTE — Telephone Encounter (Signed)
Rt knee pain on and off 1 week; last night unable to sleep due to knee pain. Pain level now is 8. Hurts worse when in room with colder temps. No known injury. Pt taking celebrex and gabapentin for her feet; but not helping knee pain. [facility] [place].

## 2012-07-18 MED ORDER — TRAMADOL HCL 50 MG PO TABS: 50.0000 mg | ORAL_TABLET | Freq: Three times a day (TID) | ORAL | Status: DC | PRN

## 2012-07-18 NOTE — Telephone Encounter (Signed)
Patient is willing to try the Tramadol.  She doesn't remember if this has worked for her in the past or not.  She is not seeing a specialist for her knee right now, she says it just started hurting for the past week or so.  I told her that if the Tramadol does not help, she should let us know and she will probably be referred to a specialist.  Please send in Tramadol to [facility] [place].

## 2012-07-18 NOTE — Telephone Encounter (Signed)
I will send tramadol to her pharmacy  Please make appt with myself or Dr Patsy Lager to check out her knee

## 2012-07-19 NOTE — Telephone Encounter (Signed)
Left message on patient Vm  To schedule an appoint to have her knee checked out.

## 2012-07-26 ENCOUNTER — Institutional Professional Consult (permissible substitution): Payer: BC Managed Care – PPO | Admitting: Pulmonary Disease

## 2012-07-28 ENCOUNTER — Ambulatory Visit (INDEPENDENT_AMBULATORY_CARE_PROVIDER_SITE_OTHER)
Admission: RE | Admit: 2012-07-28 | Discharge: 2012-07-28 | Disposition: A | Discharge status: Home / Self Care | Payer: BC Managed Care – PPO | Source: Ambulatory Visit | Attending: Family Medicine | Admitting: Family Medicine

## 2012-07-28 ENCOUNTER — Ambulatory Visit (INDEPENDENT_AMBULATORY_CARE_PROVIDER_SITE_OTHER): Payer: BC Managed Care – PPO | Admitting: Family Medicine

## 2012-07-28 ENCOUNTER — Encounter: Payer: Self-pay | Admitting: Family Medicine

## 2012-07-28 VITALS — BP 120/82 | HR 72 | Temp 98.2°F | Wt 166.0 lb

## 2012-07-28 DIAGNOSIS — M549 Dorsalgia, unspecified: Secondary | ICD-10-CM

## 2012-07-28 DIAGNOSIS — M25569 Pain in unspecified knee: Secondary | ICD-10-CM

## 2012-07-28 NOTE — Patient Instructions (Addendum)
Increase gapapentin to 2 tablets at night time Stay with 1 tablet in the morning and lunch

## 2012-07-28 NOTE — Progress Notes (Signed)
Nature conservation officer at [facility] [address] [place] [address] Phone: [phone] Fax: [phone]  Date:  07/28/2012   Name:  Mindy Long   DOB:  01/29/1961   MRN:  [id number] Gender: female  Age: 51 y.o.  PCP:  Roxy Manns, MD    Chief Complaint: Mid to right side low back pain. and Right knee pain x 3 weeks.   History of Present Illness:  Mindy Long is a 51 y.o. pleasant patient who presents with the following:  Patient RIGHT knee has been bothering her rather significantly over the last month or so. She has had some mild effusion. No trauma, injury, her accident. She does feel some pain when going up and downstairs. No symptomatic giving way or locking up of her joint. No prior traumatic fracture or operation in the affected joint. She is limping pretty significantly, and also has some pain on the RIGHT side ankle that has been chronic and ongoing.  Back, has flared up again, was controlling on medication. Patient is a history of some chronic back pain, with failure to improve despite multiple treatments, and her back has flared up worse in the last 2-3 weeks.  Knee, R knee, no injury, no trauma or accident.    Patient Active Problem List  Diagnosis  . UNSPECIFIED VITAMIN D DEFICIENCY  . HYPOKALEMIA  . ESSENTIAL HYPERTENSION  . ALLERGIC RHINITIS  . G E R D  . PEPTIC ULCER DISEASE  . BACK PAIN  . PLANTAR FASCIITIS  . DISTURBANCE OF SKIN SENSATION  . Headache, chronic daily  . PALPITATIONS  . NEPHROLITHIASIS, HX OF  . PERIMENOPAUSAL STATUS  . Foot pain  . Routine general medical examination at a health care facility  . Colon cancer screening  . BPPV (benign paroxysmal positional vertigo)  . Fatigue  . Snoring    Past Medical History  Diagnosis Date  . Headache   . GERD (gastroesophageal reflux disease)   . Plantar fasciitis   . History of nephrolithiasis   . Hypertension   . Back pain   . Ulcer 1980's    peptic ulcer disease    Past  Surgical History  Procedure Date  . Cholecystectomy 1984  . Abdominal hysterectomy 2002    partail one ovary remains  . Oophorectomy   . Colonoscopy 12/18/2011    normal    History  Substance Use Topics  . Smoking status: Never Smoker   . Smokeless tobacco: Never Used  . Alcohol Use: No    Family History  Problem Relation Age of Onset  . Stroke Father   . Hypertension Father   . Diabetes Father   . Cancer Brother     stomach tumor    Allergies  Allergen Reactions  . Atenolol Cough    Medication list has been reviewed and updated.  Current Outpatient Prescriptions on File Prior to Visit  Medication Sig Dispense Refill  . amLODipine (NORVASC) 5 MG tablet Take 1 tablet (5 mg total) by mouth daily.  30 tablet  5  . Cholecalciferol (VITAMIN D3) 2000 UNITS capsule Take 2,000 Units by mouth daily.        Marland Kitchen gabapentin (NEURONTIN) 300 MG capsule Take 1 capsule (300 mg total) by mouth 3 (three) times daily.  90 capsule  3  . traMADol (ULTRAM) 50 MG tablet Take 1 tablet (50 mg total) by mouth every 8 (eight) hours as needed for pain. Takes 1-2 tablets every 6-8 hours as needed for pain  30 tablet  0    Review of Systems:   GEN: No fevers, chills. Nontoxic. Primarily MSK c/o today. MSK: Detailed in the HPI GI: tolerating PO intake without difficulty Neuro: No numbness, parasthesias, or tingling associated. Otherwise the pertinent positives of the ROS are noted above.    Physical Examination: Filed Vitals:   07/28/12 1236  BP: 120/82  Pulse: 72  Temp: 98.2 F (36.8 C)   Filed Vitals:   07/28/12 1236  Weight: 166 lb (75.297 kg)   There is no height on file to calculate BMI. Ideal Body Weight:     GEN: WDWN, NAD, Non-toxic, Alert & Oriented x 3 HEENT: Atraumatic, Normocephalic.  Ears and Nose: No external deformity. NEURO: Normal gait.  PSYCH: Normally interactive. Conversant. Not depressed or anxious appearing.  Calm demeanor.   RIGHT knee: Mild effusion.  Tender to palpate the medial and lateral patellar facets. Positive grind. Notable medial joint line tenderness. Stable to varus and thought to stress. Negative Lachman maneuver.  Negative McMurray's test. Negative balance test. Negative flexion pinched testing. Extension to zero and flexion to 125.  Mild pain in the lumbar spine region and the SI joints. She also has some significant trochanteric bursitis right now as well as some pain in her posterior buttocks.  Hip motion is excellent and preserved. Assessment and Plan:  1. Back pain  DG Knee Bilateral Standing AP, DG Knee 1-2 Views Right  2. Knee pain     Mild appearing tricompartment arthritis.  I suspect that her arthritis flares also flared up her back.  For now we will inject her knee with corticosteroid and then recheck her in about a month. Also titrate up neurontin to 300/300/600  Ultram prn  Knee Injection, R Patient verbally consented to procedure. Risks (including potential rare risk of infection), benefits, and alternatives explained. Sterilely prepped with Chloraprep. Ethyl cholride used for anesthesia. 8 cc Lidocaine 1% mixed with 2 cc of Depo-Medrol 40 mg injected using the anterolateral approach without difficulty. No complications with procedure and tolerated well. Patient had decreased pain post-injection.   Dg Knee 1-2 Views Right  07/28/2012  *RADIOLOGY REPORT*  Clinical Data: Knee pain.  RIGHT KNEE - 1-2 VIEW  Comparison: None.  Findings: Minimal osteophytosis is seen off the lateral patellar facet.  There is no fracture.  No focal bony lesion is identified. Enthesopathic change at the quadriceps tendon insertion is noted.  IMPRESSION: Mild appearing patellofemoral degenerative change.  Otherwise negative.   Original Report Authenticated By: Bernadene Bell. D'ALESSIO, M.D.    Dg Knee Bilateral Standing Ap  07/28/2012  *RADIOLOGY REPORT*  Clinical Data: Right knee pain.  BILATERAL KNEES STANDING - 1 VIEW  Comparison: None.   Findings: Mild medial compartment joint space narrowing appears symmetric from right to left.  No osteophytosis identified.  There is no fracture.  Soft tissues are unremarkable.  IMPRESSION: Mild appearing bilateral medial compartment degenerative change.   Original Report Authenticated By: Bernadene Bell. Maricela Curet, M.D.     Orders Today:  Orders Placed This Encounter  Procedures  . DG Knee Bilateral Standing AP    Standing Status: Future     Number of Occurrences: 1     Standing Expiration Date: 09/27/2013    Order Specific Question:  Preferred imaging location?    Answer:  [facility]    Order Specific Question:  Reason for exam:    Answer:  knee pain  . DG Knee 1-2 Views Right    Standing Status: Future  Number of Occurrences: 1     Standing Expiration Date: 09/27/2013    Order Specific Question:  Preferred imaging location?    Answer:  [facility]    Order Specific Question:  Reason for exam:    Answer:  knee pain    Medications Today: (Includes new updates added during medication reconciliation) Meds ordered this encounter  Medications  . celecoxib (CELEBREX) 200 MG capsule    Sig: Take 200 mg by mouth daily.    Medications Discontinued: There are no discontinued medications.   Hannah Beat, MD

## 2012-08-11 ENCOUNTER — Telehealth: Payer: Self-pay | Admitting: Family Medicine

## 2012-08-11 NOTE — Telephone Encounter (Signed)
Caller: Arnecia/Patient; Patient Name: Mindy Long; PCP: Roxy Manns [facility]); Best Callback Phone Number: [phone] Onset-08/08/12  Afebrile.  Pt c/o of urinary  frequency and odor.  Emergent s/s of Urinary symptoms protocol r/o. Pt to see provider within 24hrs. Appointment scheduled for tomorrow at 11:45am with Dr. Milinda Antis.

## 2012-08-12 ENCOUNTER — Ambulatory Visit (INDEPENDENT_AMBULATORY_CARE_PROVIDER_SITE_OTHER): Payer: BC Managed Care – PPO | Admitting: Family Medicine

## 2012-08-12 ENCOUNTER — Encounter: Payer: Self-pay | Admitting: Family Medicine

## 2012-08-12 VITALS — BP 129/84 | HR 90 | Temp 98.6°F | Ht 63.0 in | Wt 167.2 lb

## 2012-08-12 DIAGNOSIS — N390 Urinary tract infection, site not specified: Secondary | ICD-10-CM

## 2012-08-12 DIAGNOSIS — R350 Frequency of micturition: Secondary | ICD-10-CM

## 2012-08-12 LAB — POCT URINALYSIS DIPSTICK
Ketones, UA: NEGATIVE
Protein, UA: NEGATIVE
Spec Grav, UA: 1.01
pH, UA: 7.5

## 2012-08-12 NOTE — Assessment & Plan Note (Signed)
Clear ua dip and micro ? If side eff from artificial sweetner Will stop coke zero and also other bev (all but water) Hydrate well Avoid baths Call if not better in 1-2 wk

## 2012-08-12 NOTE — Progress Notes (Signed)
Subjective:    Patient ID: Mindy Long, female    DOB: 1961-08-10, 51 y.o.   MRN: [id number]  HPI Here for uti Frequency for 7-8 days- every 15-30 min   Strong odor for 4 days   No burning  No change in appearance  No n/v No fever  No flank pain  Chronic low back pain   Has started to drink coke zero  Patient Active Problem List  Diagnosis  . UNSPECIFIED VITAMIN D DEFICIENCY  . HYPOKALEMIA  . ESSENTIAL HYPERTENSION  . ALLERGIC RHINITIS  . G E R D  . PEPTIC ULCER DISEASE  . BACK PAIN  . PLANTAR FASCIITIS  . DISTURBANCE OF SKIN SENSATION  . Headache, chronic daily  . PALPITATIONS  . NEPHROLITHIASIS, HX OF  . PERIMENOPAUSAL STATUS  . Foot pain  . Routine general medical examination at a health care facility  . Colon cancer screening  . BPPV (benign paroxysmal positional vertigo)  . Fatigue  . Snoring   Past Medical History  Diagnosis Date  . Headache   . GERD (gastroesophageal reflux disease)   . Plantar fasciitis   . History of nephrolithiasis   . Hypertension   . Back pain   . Ulcer 1980's    peptic ulcer disease   Past Surgical History  Procedure Date  . Cholecystectomy 1984  . Abdominal hysterectomy 2002    partail one ovary remains  . Oophorectomy   . Colonoscopy 12/18/2011    normal   History  Substance Use Topics  . Smoking status: Never Smoker   . Smokeless tobacco: Never Used  . Alcohol Use: No   Family History  Problem Relation Age of Onset  . Stroke Father   . Hypertension Father   . Diabetes Father   . Cancer Brother     stomach tumor   Allergies  Allergen Reactions  . Atenolol Cough   Current Outpatient Prescriptions on File Prior to Visit  Medication Sig Dispense Refill  . amLODipine (NORVASC) 5 MG tablet Take 1 tablet (5 mg total) by mouth daily.  30 tablet  5  . celecoxib (CELEBREX) 200 MG capsule Take 200 mg by mouth daily.      . Cholecalciferol (VITAMIN D3) 2000 UNITS capsule Take 2,000 Units by mouth daily.        Marland Kitchen  gabapentin (NEURONTIN) 300 MG capsule Take 1 capsule (300 mg total) by mouth 3 (three) times daily.  90 capsule  3  . traMADol (ULTRAM) 50 MG tablet Take 1 tablet (50 mg total) by mouth every 8 (eight) hours as needed for pain. Takes 1-2 tablets every 6-8 hours as needed for pain  30 tablet  0     Review of Systems Review of Systems  Constitutional: Negative for fever, appetite change, fatigue and unexpected weight change.  Eyes: Negative for pain and visual disturbance.  Respiratory: Negative for cough and shortness of breath.   Cardiovascular: Negative for cp or palpitations    Gastrointestinal: Negative for nausea, diarrhea and constipation.  Genitourinary: pos for urgency and frequency. neg for blood in urine or flank pain  Skin: Negative for pallor or rash   Neurological: Negative for weakness, light-headedness, numbness and headaches.  Hematological: Negative for adenopathy. Does not bruise/bleed easily.  Psychiatric/Behavioral: Negative for dysphoric mood. The patient is not nervous/anxious.         Objective:   Physical Exam  Constitutional: She appears well-developed and well-nourished. No distress.  HENT:  Head: Normocephalic and atraumatic.  Mouth/Throat:  Oropharynx is clear and moist.  Eyes: Conjunctivae and EOM are normal. Pupils are equal, round, and reactive to light. No scleral icterus.  Neck: Normal range of motion. Neck supple. No JVD present. No thyromegaly present.  Cardiovascular: Normal rate and regular rhythm.   Pulmonary/Chest: Effort normal and breath sounds normal. No respiratory distress. She has no wheezes.  Abdominal: Soft. Bowel sounds are normal. She exhibits no distension and no mass. There is no tenderness.  Musculoskeletal: She exhibits no edema.       No cva tenderness   Lymphadenopathy:    She has no cervical adenopathy.  Neurological: She is alert. She has normal reflexes.  Skin: Skin is warm and dry. No rash noted. No erythema. No pallor.    Psychiatric: She has a normal mood and affect.          Assessment & Plan:

## 2012-08-12 NOTE — Patient Instructions (Addendum)
For frequent urination - stop the coke zero, in fact stop all beverages but water at this time  If symptoms do not improve in 1-2 weeks, call and let me know  Also if any vaginal symptoms , let me know  Urine looks clear

## 2012-08-14 LAB — POCT UA - MICROSCOPIC ONLY
Bacteria, U Microscopic: 0
Mucus, UA: 0
WBC, Ur, HPF, POC: 0
Yeast, UA: 0

## 2012-08-15 ENCOUNTER — Other Ambulatory Visit: Payer: Self-pay | Admitting: *Deleted

## 2012-08-15 MED ORDER — GABAPENTIN 300 MG PO CAPS: 300.0000 mg | ORAL_CAPSULE | Freq: Three times a day (TID) | ORAL | Status: DC

## 2012-08-15 NOTE — Telephone Encounter (Signed)
Received faxed refill request from pharmacy. Last office visit 08/12/12, acute. Is it okay to refill?

## 2012-08-15 NOTE — Telephone Encounter (Signed)
Please give her 71 with 3 refils , thanks

## 2012-08-17 ENCOUNTER — Encounter: Payer: Self-pay | Admitting: Pulmonary Disease

## 2012-08-17 ENCOUNTER — Ambulatory Visit (INDEPENDENT_AMBULATORY_CARE_PROVIDER_SITE_OTHER): Payer: BC Managed Care – PPO | Admitting: Pulmonary Disease

## 2012-08-17 VITALS — BP 128/80 | HR 108 | Temp 98.5°F | Ht 63.0 in | Wt 167.2 lb

## 2012-08-17 DIAGNOSIS — R0989 Other specified symptoms and signs involving the circulatory and respiratory systems: Secondary | ICD-10-CM

## 2012-08-17 DIAGNOSIS — R0609 Other forms of dyspnea: Secondary | ICD-10-CM

## 2012-08-17 DIAGNOSIS — R0683 Snoring: Secondary | ICD-10-CM

## 2012-08-17 NOTE — Patient Instructions (Addendum)
Will schedule for a sleep study to evaluate for possible sleep apnea, as well as a possible movement disorder of sleep. Will arrange followup once results are available.

## 2012-08-17 NOTE — Progress Notes (Signed)
  Subjective:    Patient ID: Mindy Long, female    DOB: 1961-07-28, 51 y.o.   MRN: [id number]  HPI The patient is a 51 year old female who I've been asked to see for possible sleep apnea.  She has been noting fatigue for the last 1-2 years, and recently had a severe episode that was quite bothersome to her.  She also states there is an element of sleepiness as well.  She has been noted to have loud snoring, but her bed partner has never commented on an abnormal breathing pattern during sleep.  The patient has frequent awakenings at night, and has not rested in the mornings upon arising.  She is unsure if she kicks during the night, but she has symptoms in the evening suggestive of the restless leg syndrome.  She has periods of significant daytime sleepiness with periods of inactivity, and this can also be an issue at night while watching television.  She currently will fall asleep if she tries to watch a movie.  She also describes sleep pressure with driving 20 or more minutes.  The patient states that her weight is up about 20 pounds over the last 2 years, and her Epworth sleepiness score today is abnormal at 13.  Sleep Questionnaire: What time do you typically go to bed?( Between what hours) 10-11 PM How long does it take you to fall asleep? 30-45 MIN How many times during the night do you wake up? What time do you get out of bed to start your day? 0530 Do you drive or operate heavy machinery in your occupation? No How much has your weight changed (up or down) over the past two years? (In pounds) 20 lb (9.072 kg) Have you ever had a sleep study before? No Do you currently use CPAP? No Do you wear oxygen at any time? No    Review of Systems  Constitutional: Negative for fever and unexpected weight change.  HENT: Positive for rhinorrhea. Negative for ear pain, nosebleeds, congestion, sore throat, sneezing, trouble swallowing, dental problem, postnasal drip and sinus pressure.   Eyes: Negative for  redness and itching.  Respiratory: Negative for cough, chest tightness, shortness of breath and wheezing.   Cardiovascular: Negative for palpitations and leg swelling.  Gastrointestinal: Negative for nausea and vomiting.  Genitourinary: Negative for dysuria.  Musculoskeletal: Negative for joint swelling.  Skin: Negative for rash.  Neurological: Negative for headaches.  Hematological: Does not bruise/bleed easily.  Psychiatric/Behavioral: Negative for dysphoric mood. The patient is not nervous/anxious.        Objective:   Physical Exam Constitutional:  Well developed, no acute distress  HENT:  Nares patent without discharge  Oropharynx without exudate, palate and uvula are significantly elongated.   Eyes:  Perrla, eomi, no scleral icterus  Neck:  No JVD, no TMG  Cardiovascular:  Normal rate, regular rhythm, no rubs or gallops.  No murmurs        Intact distal pulses  Pulmonary :  Normal breath sounds, no stridor or respiratory distress   No rales, rhonchi, or wheezing  Abdominal:  Soft, nondistended, bowel sounds present.  No tenderness noted.   Musculoskeletal:  No lower extremity edema noted.  Lymph Nodes:  No cervical lymphadenopathy noted  Skin:  No cyanosis noted  Neurologic:  Alert, appropriate, moves all 4 extremities without obvious deficit.         Assessment & Plan:

## 2012-08-17 NOTE — Assessment & Plan Note (Signed)
The patient is having great difficulty with daytime fatigue as well as sleepiness, and her history is suggestive of possible sleep disordered breathing.  She also has a history that is suggestive of restless leg syndrome, but is unsure if she kicks during the night.  Since her quality of life has been negatively impacted, I think it is worthwhile to do a sleep study to evaluate for possible disruptions of sleep during the night.  The patient is agreeable.  We'll arrange followup to discuss results once available.

## 2012-08-22 ENCOUNTER — Ambulatory Visit (INDEPENDENT_AMBULATORY_CARE_PROVIDER_SITE_OTHER): Payer: BC Managed Care – PPO | Admitting: Family Medicine

## 2012-08-22 ENCOUNTER — Telehealth: Payer: Self-pay | Admitting: Family Medicine

## 2012-08-22 ENCOUNTER — Ambulatory Visit: Payer: BC Managed Care – PPO | Admitting: Family Medicine

## 2012-08-22 ENCOUNTER — Encounter: Payer: Self-pay | Admitting: Family Medicine

## 2012-08-22 VITALS — BP 124/78 | HR 78 | Temp 99.1°F | Ht 63.0 in | Wt 166.8 lb

## 2012-08-22 DIAGNOSIS — J069 Acute upper respiratory infection, unspecified: Secondary | ICD-10-CM | POA: Insufficient documentation

## 2012-08-22 MED ORDER — GUAIFENESIN-CODEINE 100-10 MG/5ML PO SYRP: 5.0000 mL | ORAL_SOLUTION | Freq: Every evening | ORAL | Status: DC | PRN

## 2012-08-22 NOTE — Assessment & Plan Note (Signed)
Disc symptomatic care - see instructions on AVS  Will try robitussin DM for day and AC (px) for night  Fluids/ rest , zyrtec prn  Update if not starting to improve in a week or if worsening   In light of strep throat in house- if she develops sore throat- will call

## 2012-08-22 NOTE — Telephone Encounter (Signed)
Caller: [name]/Patient; Patient Name: Mindy Long; PCP: Roxy Manns [facility]); Best Callback Phone Number: [phone]; Reason for call: Cough/Congestion that began 4-5 days ago. Dry cough and runny nose. Spouse has been sick with same in recent past. Afebrile. No other symptoms. She was advised by Pharmacist to stop taking Claritin D because of her high blood pressure.  Per Cough-Adult Protocol, Recurrent episodes of uncontrolled coughing interfering with ability to carry out usual activities or with normal sleep patterns, see in 24 hrs. Appointment scheduled for today, Monday 9/16 at 3:45 with Dr Milinda Antis. Caller is agreeable.

## 2012-08-22 NOTE — Telephone Encounter (Signed)
She was seen today

## 2012-08-22 NOTE — Patient Instructions (Addendum)
You have a viral uri (upper respiratory infection)- or cold Drink lots of fluids and try to get extra rest  Take robitussin DM during the day as directed for congestion and cough Take robitussin AC at bedtime- this is the px with codeine- it will sedate- use with caution Also zyrtec 10 mg over the counter once daily to help with runny nose and sneezing  Breathe steam and try nasal saline spray for congestion also Update if not starting to improve in a week or if worsening   If you develop a sore throat (since strep is in your house)- call and let me know

## 2012-08-22 NOTE — Progress Notes (Signed)
Subjective:    Patient ID: Mindy Long, female    DOB: 21-Jan-1961, 51 y.o.   MRN: [id number]  HPI Here with uri symptoms  Husband had symptoms first - he had a cold and then strep throat  Her throat does not hurt at all  Runny/ stuffy nose for 5-7 days-- took some claritin D (stopped due to HBP)  Cough - dry hacking cough Run down/ tired  No fever at home , here 99.1   Is having sinus pain and tenderness Mostly clear nasal drainage   Patient Active Problem List  Diagnosis  . UNSPECIFIED VITAMIN D DEFICIENCY  . HYPOKALEMIA  . ESSENTIAL HYPERTENSION  . ALLERGIC RHINITIS  . G E R D  . PEPTIC ULCER DISEASE  . BACK PAIN  . PLANTAR FASCIITIS  . DISTURBANCE OF SKIN SENSATION  . Headache, chronic daily  . PALPITATIONS  . NEPHROLITHIASIS, HX OF  . PERIMENOPAUSAL STATUS  . Foot pain  . Routine general medical examination at a health care facility  . Colon cancer screening  . BPPV (benign paroxysmal positional vertigo)  . Fatigue  . Snoring  . Frequent urination   Past Medical History  Diagnosis Date  . Headache   . GERD (gastroesophageal reflux disease)   . Plantar fasciitis   . History of nephrolithiasis   . Hypertension   . Back pain   . Ulcer 1980's    peptic ulcer disease   Past Surgical History  Procedure Date  . Cholecystectomy 1984  . Abdominal hysterectomy 2002    partail one ovary remains  . Oophorectomy   . Colonoscopy 12/18/2011    normal   History  Substance Use Topics  . Smoking status: Never Smoker   . Smokeless tobacco: Never Used  . Alcohol Use: No   Family History  Problem Relation Age of Onset  . Stroke Father   . Hypertension Father   . Diabetes Father   . Cancer Brother     stomach tumor   Allergies  Allergen Reactions  . Atenolol Cough   Current Outpatient Prescriptions on File Prior to Visit  Medication Sig Dispense Refill  . amLODipine (NORVASC) 5 MG tablet Take 1 tablet (5 mg total) by mouth daily.  30 tablet  5  .  celecoxib (CELEBREX) 200 MG capsule Take 200 mg by mouth daily.      . Cholecalciferol (VITAMIN D3) 2000 UNITS capsule Take 2,000 Units by mouth daily.        Marland Kitchen gabapentin (NEURONTIN) 300 MG capsule Take 1 capsule (300 mg total) by mouth 3 (three) times daily.  90 capsule  3  . traMADol (ULTRAM) 50 MG tablet Take 1 tablet (50 mg total) by mouth every 8 (eight) hours as needed for pain. Takes 1-2 tablets every 6-8 hours as needed for pain  30 tablet  0        Review of Systems Review of Systems  Constitutional: Negative for  Weight change, pos for fatigue and fever  Eyes: Negative for pain and visual disturbance. neg for redness/ discharge ENT pos for rhinorrhea and congestion, neg for ST or facial pain  Respiratory: Negative for sob or wheeze   Cardiovascular: Negative for cp or palpitations    Gastrointestinal: Negative for nausea, diarrhea and constipation.  Genitourinary: Negative for urgency and frequency.  Skin: Negative for pallor or rash  neg for insect bites Neurological: Negative for weakness, light-headedness, numbness and headaches.  Hematological: Negative for adenopathy. Does not bruise/bleed easily.  Psychiatric/Behavioral: Negative  for dysphoric mood. The patient is not nervous/anxious.         Objective:   Physical Exam  Constitutional: She appears well-developed and well-nourished. No distress.  HENT:  Head: Normocephalic and atraumatic.  Right Ear: External ear normal.  Left Ear: External ear normal.  Mouth/Throat: Oropharynx is clear and moist.       Nares are injected and congested  No facial tenderness  Clear post nasal drip   Eyes: Conjunctivae normal and EOM are normal. Pupils are equal, round, and reactive to light. Right eye exhibits no discharge. Left eye exhibits no discharge.  Neck: Normal range of motion. Neck supple.  Cardiovascular: Normal rate and regular rhythm.   Pulmonary/Chest: Effort normal and breath sounds normal. No respiratory distress.  She has no wheezes. She has no rales.  Lymphadenopathy:    She has no cervical adenopathy.  Neurological: She is alert.  Skin: Skin is warm and dry. No rash noted.  Psychiatric: She has a normal mood and affect.          Assessment & Plan:

## 2012-08-24 ENCOUNTER — Ambulatory Visit: Payer: BC Managed Care – PPO | Admitting: Family Medicine

## 2012-08-30 ENCOUNTER — Ambulatory Visit (HOSPITAL_BASED_OUTPATIENT_CLINIC_OR_DEPARTMENT_OTHER): Payer: BC Managed Care – PPO | Attending: Pulmonary Disease

## 2012-08-30 VITALS — Ht 63.0 in | Wt 168.0 lb

## 2012-08-30 DIAGNOSIS — R0683 Snoring: Secondary | ICD-10-CM

## 2012-08-30 DIAGNOSIS — G4733 Obstructive sleep apnea (adult) (pediatric): Secondary | ICD-10-CM

## 2012-09-06 ENCOUNTER — Other Ambulatory Visit: Payer: Self-pay | Admitting: *Deleted

## 2012-09-06 MED ORDER — AMLODIPINE BESYLATE 5 MG PO TABS: 5.0000 mg | ORAL_TABLET | Freq: Every day | ORAL | Status: DC

## 2012-09-10 DIAGNOSIS — G4710 Hypersomnia, unspecified: Secondary | ICD-10-CM

## 2012-09-10 DIAGNOSIS — G4730 Sleep apnea, unspecified: Secondary | ICD-10-CM

## 2012-09-10 NOTE — Procedures (Signed)
NAME:  Novakovich, Mindy Long               ACCOUNT NO.:  [id number]  MEDICAL RECORD NO.:  [id number]          PATIENT TYPE:  OUT  LOCATION:  SLEEP CENTER                 FACILITY:  [facility]  PHYSICIAN:  Barbaraann Share, MD,FCCPDATE OF BIRTH:  Jan 15, 1961  DATE OF STUDY:  08/30/2012                           NOCTURNAL POLYSOMNOGRAM  REFERRING PHYSICIAN:  Barbaraann Share, MD,FCCP  INDICATION FOR STUDY:  Hypersomnia with sleep apnea.  EPWORTH SLEEPINESS SCORE:  12.  SLEEP ARCHITECTURE:  The patient had total sleep time of 307 minutes with no slow-wave sleep and only 35 minutes of REM.  Sleep onset latency was prolonged at 48 minutes and REM onset was normal at 105 minutes. Sleep efficiency was mildly reduced at 84%.  RESPIRATORY DATA:  The patient was found to have 12 apneas and 40 obstructive hypopneas, giving her an apnea/hypopnea index of 10 events per hour.  The events occurred in all body positions, but were more common during REM.  Moderate snoring was noted throughout.  OXYGEN DATA:  There was O2 desaturation as low as 77% with the patient's obstructive events.  CARDIAC DATA:  No clinically significant arrhythmias were noted.  MOVEMENT/PARASOMNIA:  The patient had no significant leg jerks or other abnormal behavior seen.  IMPRESSION/RECOMMENDATION:  Mild obstructive sleep apnea/hypopnea syndrome, with an AHI of 10 events per hour and O2 desaturation as low as 77%.  Treatment for this degree of sleep apnea can include a trial of weight loss alone, upper airway surgery, dental appliance, and also CPAP.  Clinical correlation is suggested.     Barbaraann Share, MD,FCCP Diplomate, American Board of Sleep Medicine    [facility]/MEDQ  D:  09/10/2012 13:36:55  T:  09/10/2012 19:38:16  Job:  [id number]

## 2012-09-13 ENCOUNTER — Telehealth: Payer: Self-pay | Admitting: Pulmonary Disease

## 2012-09-13 NOTE — Telephone Encounter (Signed)
Pt needs ov to review recent sleep study results.  Try to get her in reasonably quickly .  Thanks.

## 2012-09-15 NOTE — Telephone Encounter (Signed)
Pt has been scheduled for follow-up on Tues., 09/27/12.

## 2012-09-27 ENCOUNTER — Encounter: Payer: Self-pay | Admitting: Pulmonary Disease

## 2012-09-27 ENCOUNTER — Ambulatory Visit (INDEPENDENT_AMBULATORY_CARE_PROVIDER_SITE_OTHER): Payer: BC Managed Care – PPO | Admitting: Pulmonary Disease

## 2012-09-27 VITALS — BP 110/82 | HR 81 | Temp 99.3°F | Ht 63.0 in | Wt 170.6 lb

## 2012-09-27 DIAGNOSIS — G4733 Obstructive sleep apnea (adult) (pediatric): Secondary | ICD-10-CM

## 2012-09-27 NOTE — Assessment & Plan Note (Signed)
The patient has mild obstructive sleep apnea on her recent sleep study.  I have told her this does not represent a significant health risk for her, and the decision to treat this aggressively should be based on its impact to her quality of life.  I have reviewed various treatment options, including a trial of weight loss alone, upper airway surgery, dental appliance, and also CPAP.  The patient at this point would like to take the next 6 months to work aggressively on weight loss and see how she does.  I will refer her to nutrition at the hospital to help her with this.  If she changes her mind, or does not see significant weight loss, she will consider some of the other more aggressive options.  She will contact me at that time.

## 2012-09-27 NOTE — Progress Notes (Signed)
  Subjective:    Patient ID: Mindy Long, female    DOB: 1961/04/18, 51 y.o.   MRN: [id number]  HPI The patient comes in today for followup of her recent sleep study.  She was found to have mild obstructive sleep apnea, with an AHI of 10 events per hour.  I have reviewed this study with her in detail, and answered all of her questions.   Review of Systems  Constitutional: Negative for fever and unexpected weight change.  HENT: Negative for ear pain, nosebleeds, congestion, sore throat, rhinorrhea, sneezing, trouble swallowing, dental problem, postnasal drip and sinus pressure.   Eyes: Negative for redness and itching.  Respiratory: Negative for cough, chest tightness, shortness of breath and wheezing.   Cardiovascular: Negative for palpitations and leg swelling.  Gastrointestinal: Negative for nausea and vomiting.  Genitourinary: Negative for dysuria.  Musculoskeletal: Negative for joint swelling.  Skin: Negative for rash.  Neurological: Negative for headaches.  Hematological: Bruises/bleeds easily.  Psychiatric/Behavioral: Negative for dysphoric mood. The patient is not nervous/anxious.        Objective:   Physical Exam Overweight female in no acute distress Nose without purulence or discharge noted No skin breakdown or pressure necrosis from the CPAP mask Lower extremities without edema, no cyanosis Alert and oriented, moves all 4 extremities.       Assessment & Plan:

## 2012-09-27 NOTE — Patient Instructions (Addendum)
Work on weight loss for the next 6mos to treat your sleep apnea.  Will refer you to a nutritionist at [facility] to help with this. If you find that you are not successful, or if you decide to treat your sleep apnea more aggressively, let me know.  Would need to consider a dental appliance or cpap.

## 2012-09-28 ENCOUNTER — Other Ambulatory Visit: Payer: Self-pay | Admitting: Family Medicine

## 2012-09-28 DIAGNOSIS — Z1231 Encounter for screening mammogram for malignant neoplasm of breast: Secondary | ICD-10-CM

## 2012-10-11 ENCOUNTER — Other Ambulatory Visit: Payer: Self-pay | Admitting: *Deleted

## 2012-10-11 NOTE — Telephone Encounter (Signed)
Ok to refill 

## 2012-10-11 NOTE — Telephone Encounter (Signed)
Please refil for 3 months, thanks 

## 2012-10-12 MED ORDER — TRAMADOL HCL 50 MG PO TABS: 50.0000 mg | ORAL_TABLET | Freq: Three times a day (TID) | ORAL | Status: DC | PRN

## 2012-10-25 ENCOUNTER — Encounter: Payer: BC Managed Care – PPO | Attending: Pulmonary Disease | Admitting: *Deleted

## 2012-10-25 ENCOUNTER — Encounter: Payer: Self-pay | Admitting: *Deleted

## 2012-10-25 DIAGNOSIS — Z713 Dietary counseling and surveillance: Secondary | ICD-10-CM | POA: Insufficient documentation

## 2012-10-25 DIAGNOSIS — E669 Obesity, unspecified: Secondary | ICD-10-CM | POA: Insufficient documentation

## 2012-10-25 NOTE — Patient Instructions (Addendum)
Goals:  Put away the scale.  Refrain from weighing self between nutrition appointments Reject diet mentality- there are no good or bad foods Listen to internal hunger cues and honor those cues; don't wait until you're ravenous to eat Choose the food(s) you want Enjoy those foods.  Try to make meal last 20 minutes Stop eating when full.  Honor fullness cues too Do these things without any guilt or regret  

## 2012-10-25 NOTE — Progress Notes (Signed)
Medical Nutrition Therapy:  Appt start time: 1030 end time:  1130.   Assessment:  Primary concerns today: patient suffers from OSA.  She's been advised to loose weight to lessen her symptoms.   MEDICATIONS: see list   DIETARY INTAKE:  Usual eating pattern includes 2 meals and 0-2 snacks per day.  24-hr recall:  B ( AM): eggs, grits, bacon.  Oatmeal or cereal with coffee and 2 packets cream and 5 packets sugar  Snk ( AM): not usually  L ( PM): banana.  May eat out sometimes Snk ( PM): not usually- maybe grapes or raisins D ( PM): rice, meatloaf, vegetables.  Goes out sometimes.  May order pizza on Saturdays Snk ( PM): something sweet Beverages: diet soda, juice,water  Usual physical activity: none currently  Estimated energy needs: 1500-1600 calories 170-180 g carbohydrates 112-120 g protein 42-44 g fat  Progress Towards Goal(s):  In progress.   Nutritional Diagnosis:  Bronson-3.3 Overweight/obesity As related to decreased metabolic rate resulting from limited adherance to internal hunger and satiety cues.  As evidenced by BMI of 30.    Intervention:  Nutrition counseling provided.  Cherrel has gained some weight since she stopped working.  She is at home during the day and not physically active.  She also follows a restricted meal pattern: she may eat only 2 meals a day.  She has lost touch with her internal hunger cues and with her internal fullness cues.  She will eat whatever her husband is eating, whenever he's eating, regardless if it's something she wants to eat or even if she's hungry.  I suspect a decreased metabolism from inadequate eating occasions and limited physical activity.  Suggested water exercises or chair exercises as to not exacerbate her back or foot pain.  Encouraged patient to reject traditional diet mentality of "good" vs "bad" foods.  There are no good and bad foods, but rather food is fuel that we needs for our bodies.  When we don't get enough fuel, our bodies  suffer the metabolic consequences.  Encouraged patient to eat whatever foods will satisfy them, regardless of their nutritional value.  We will discuss nutritional values of foods at a subsequent appointment.  Encouraged patient to honor their body's internal hunger and fullness cues.  Throughout the day, check in mentally and rate hunger.  Try not to eat when ravenous, but instead when slightly hungry.  Then choose food(s) that will be satisfying regardless of nutritional content.  Sit down to enjoy those foods.  Minimize distractions: turn off tv, put away books, work, Programmer, applications.  Make the meal last at least 20 minutes in order to give time to experience and register satiety.  Stop eating when full regardless of how much food is left on the plate.  Get more if still hungry.  The key is to honor fullness so throughout the meal, rate fullness factor and stop when comfortably full, but not stuffed.  Reminded patient that they can have any food they want, whenever they want, and however much they want.  Eventually the novelty will wear out and each food will be equal in terms of its emotional appeal.  This will be a learning process and some days more food will be eaten, some days less.  The key is to honor hunger and fullness without any feelings of guilt.  Pay attention to what the internal cues are, rather than any external factors.  Monitoring/Evaluation:  Dietary intake, exercise, intuitive eating, and body weight in 1  month(s).

## 2012-10-31 ENCOUNTER — Ambulatory Visit
Admission: RE | Admit: 2012-10-31 | Discharge: 2012-10-31 | Disposition: A | Discharge status: Home / Self Care | Payer: BC Managed Care – PPO | Source: Ambulatory Visit | Attending: Family Medicine | Admitting: Family Medicine

## 2012-10-31 DIAGNOSIS — Z1231 Encounter for screening mammogram for malignant neoplasm of breast: Secondary | ICD-10-CM

## 2012-11-02 ENCOUNTER — Encounter: Payer: Self-pay | Admitting: *Deleted

## 2012-11-22 ENCOUNTER — Other Ambulatory Visit: Payer: Self-pay | Admitting: Family Medicine

## 2012-11-22 NOTE — Telephone Encounter (Signed)
Pt advise we can't refill med so I advise her to try Mucinex DM OTC, and to follow up with [place] if no improvement, pt agrees with recommendation

## 2012-11-22 NOTE — Telephone Encounter (Signed)
Pt said she did get better but 2 days ago she got a cold. Due to pt's high BP she wanted a cough med that was prescribed to her, if you will not refill med please advise pt of what med to get OTC because pt is afraid to take something OTC and it make her BP go up

## 2012-11-22 NOTE — Telephone Encounter (Signed)
Cannot refil without visit-if still coughing that much needs re eval

## 2012-11-22 NOTE — Telephone Encounter (Signed)
Ok to refill 

## 2012-11-22 NOTE — Telephone Encounter (Signed)
Have her try mucinex DM otc and follow up if worse or no improvement

## 2012-11-23 ENCOUNTER — Ambulatory Visit: Payer: BC Managed Care – PPO | Admitting: *Deleted

## 2012-12-28 ENCOUNTER — Telehealth: Payer: Self-pay | Admitting: Family Medicine

## 2012-12-28 NOTE — Telephone Encounter (Signed)
Agree with that advisement- will watch epic to see that she goes

## 2012-12-28 NOTE — Telephone Encounter (Signed)
Patient Information:  Caller Name: Manali  Phone: [phone]  Patient: Mindy Long, Mindy Long  Gender: Female  DOB: [birth year]  Age: 52 Years  PCP: [place], [employer] [facility])  Pregnant: No  Office Follow Up:  Does the office need to follow up with this patient?: No  Instructions For The Office: N/A  RN Note:  Reports constant "unbearable" pain; Pain rated 7 out of 10 now; but at times it is a 9/10. Notes burning sensation in upper abdomen; pain is worse after eating. Advised to go to [facility] ED now.  Symptoms  Reason For Call & Symptoms: Requesting medication for GERD; symptoms flared up 12/24/12 and occuring daily.  Reviewed Health History In EMR: Yes  Reviewed Medications In EMR: Yes  Reviewed Allergies In EMR: Yes  Reviewed Surgeries / Procedures: Yes  Date of Onset of Symptoms: 12/24/2012  Treatments Tried: Mylanta, Tums  Treatments Tried Worked: Yes OB / GYN:  LMP: Unknown  Guideline(s) Used:  Chest Pain  Abdominal Pain - Upper  Disposition Per Guideline:   Go to ED Now  Reason For Disposition Reached:   Pain lasting > 10 minutes and over 61 years old  Advice Given:  N/A

## 2013-01-10 ENCOUNTER — Telehealth: Payer: Self-pay

## 2013-01-10 NOTE — Telephone Encounter (Signed)
Pt brought by immunization form for school to be completed and signed by Dr Milinda Antis. On Shapele's desk.

## 2013-01-11 ENCOUNTER — Encounter: Payer: Self-pay | Admitting: Family Medicine

## 2013-01-11 ENCOUNTER — Ambulatory Visit (INDEPENDENT_AMBULATORY_CARE_PROVIDER_SITE_OTHER): Payer: 59 | Admitting: Family Medicine

## 2013-01-11 ENCOUNTER — Telehealth: Payer: Self-pay

## 2013-01-11 ENCOUNTER — Ambulatory Visit: Payer: Self-pay | Admitting: Family Medicine

## 2013-01-11 VITALS — BP 120/82 | HR 76 | Temp 98.5°F | Ht 63.0 in

## 2013-01-11 DIAGNOSIS — M79609 Pain in unspecified limb: Secondary | ICD-10-CM

## 2013-01-11 DIAGNOSIS — L03119 Cellulitis of unspecified part of limb: Secondary | ICD-10-CM

## 2013-01-11 DIAGNOSIS — M79606 Pain in leg, unspecified: Secondary | ICD-10-CM

## 2013-01-11 DIAGNOSIS — L02419 Cutaneous abscess of limb, unspecified: Secondary | ICD-10-CM

## 2013-01-11 MED ORDER — CEPHALEXIN 500 MG PO CAPS: 1000.0000 mg | ORAL_CAPSULE | Freq: Two times a day (BID) | ORAL | Status: DC

## 2013-01-11 NOTE — Progress Notes (Signed)
Nature conservation officer at [facility] [address] [place] [address] Phone: [phone] Fax: [phone]  Date:  01/11/2013   Name:  Mindy Long   DOB:  Feb 02, 1961   MRN:  [id number] Gender: female Age: 52 y.o.  Primary Physician:  Roxy Manns, MD  Evaluating MD: Hannah Beat, MD   Chief Complaint: Right ankle pain   History of Present Illness:  Mindy Long is a 52 y.o. pleasant patient who presents with the following:  Ankle, woke up and was in excruciating pain in the R lower medial aspect of her distal leg but not near the joint or any of her major ligaments. She has had no trauma and no injury. There is some associated redness and pain. Pain with resisted inversion. It was fairly bad, and then it awakened her from pain in the morning. She is able to walk, but she is having a limp.  Right medial  Patient Active Problem List  Diagnosis  . UNSPECIFIED VITAMIN D DEFICIENCY  . HYPOKALEMIA  . ESSENTIAL HYPERTENSION  . ALLERGIC RHINITIS  . G E R D  . PEPTIC ULCER DISEASE  . BACK PAIN  . PLANTAR FASCIITIS  . DISTURBANCE OF SKIN SENSATION  . Headache, chronic daily  . PALPITATIONS  . NEPHROLITHIASIS, HX OF  . PERIMENOPAUSAL STATUS  . Foot pain  . Routine general medical examination at a health care facility  . Colon cancer screening  . BPPV (benign paroxysmal positional vertigo)  . Fatigue  . Snoring  . Frequent urination  . Viral URI with cough  . OSA (obstructive sleep apnea)    Past Medical History  Diagnosis Date  . Headache   . GERD (gastroesophageal reflux disease)   . Plantar fasciitis   . History of nephrolithiasis   . Hypertension   . Back pain   . Ulcer 1980's    peptic ulcer disease  . Sleep apnea     Past Surgical History  Procedure Date  . Cholecystectomy 1984  . Abdominal hysterectomy 2002    partail one ovary remains  . Oophorectomy   . Colonoscopy 12/18/2011    normal    History  Substance Use Topics  . Smoking  status: Never Smoker   . Smokeless tobacco: Never Used  . Alcohol Use: No    Family History  Problem Relation Age of Onset  . Stroke Father   . Hypertension Father   . Diabetes Father   . Cancer Brother     stomach tumor    Allergies  Allergen Reactions  . Atenolol Cough    Medication list has been reviewed and updated.  Outpatient Prescriptions Prior to Visit  Medication Sig Dispense Refill  . amLODipine (NORVASC) 5 MG tablet Take 1 tablet (5 mg total) by mouth daily.  30 tablet  5  . celecoxib (CELEBREX) 200 MG capsule Take 200 mg by mouth daily.      . Cholecalciferol (VITAMIN D3) 2000 UNITS capsule Take 2,000 Units by mouth daily.        Marland Kitchen gabapentin (NEURONTIN) 300 MG capsule Take 1 capsule (300 mg total) by mouth 3 (three) times daily.  90 capsule  3  . traMADol (ULTRAM) 50 MG tablet Take 1 tablet (50 mg total) by mouth every 8 (eight) hours as needed for pain. Takes 1-2 tablets every 6-8 hours as needed for pain  30 tablet  2  . [DISCONTINUED] guaiFENesin-codeine (ROBITUSSIN AC) 100-10 MG/5ML syrup Take 5-10 mLs by mouth at bedtime as needed  for cough (caution- it will sedate).  120 mL  0   Last reviewed on 01/11/2013 12:37 PM by Eliezer Bottom, CMA  Review of Systems:   GEN: No acute illnesses, no fevers, chills. GI: No n/v/d, eating normally Pulm: No SOB Interactive and getting along well at home.  Otherwise, ROS is as per the HPI.   Physical Examination: BP 120/82  Pulse 76  Temp 98.5 F (36.9 C)  Ht 5\' 3"  (1.6 m)  Ideal Body Weight: Weight in (lb) to have BMI = 25: 140.8    GEN: WDWN, NAD, Non-toxic, Alert & Oriented x 3 HEENT: Atraumatic, Normocephalic.  Ears and Nose: No external deformity. EXTR: No clubbing/cyanosis/edema NEURO: Normal gait.  PSYCH: Normally interactive. Conversant. Not depressed or anxious appearing.  Calm demeanor.  MSK: NT throughout forefoot, midfoot, hindfoot, and ankle. All ligaments intact and nontender. Resisted eversion  causes some pain. Achilles intact. NT at peroneals and PT> SKIN: medial distal LE with some redness and mild warmth. homan's neg  Assessment and Plan:  1. Leg pain  US Venous Img Lower Unilateral Right  2. Cellulitis and abscess of leg     I think most c/w cellulitis. Clearly not orthopedic. Not around joint, so I don't see how gout would fit here  With redness and pain, clot is a possibility, so I will get a doppler u/s to rule out LE dvt. If negative, will treat for presumptive cellulitis.  Orders Today:  Orders Placed This Encounter  Procedures  . US Venous Img Lower Unilateral Right    Standing Status: Future     Number of Occurrences:      Standing Expiration Date: 03/11/2014    Order Specific Question:  Reason for exam:    Answer:  r/o Lower extr DVT    Order Specific Question:  Preferred imaging location?    Answer:  External    Updated Medication List: (Includes new medications, updates to list, dose adjustments) Meds ordered this encounter  Medications  . cephALEXin (KEFLEX) 500 MG capsule    Sig: Take 2 capsules (1,000 mg total) by mouth 2 (two) times daily.    Dispense:  40 capsule    Refill:  0    Medications Discontinued: Medications Discontinued During This Encounter  Medication Reason  . guaiFENesin-codeine (ROBITUSSIN AC) 100-10 MG/5ML syrup Error     Signed, Hershell Brandl T. Quincey Nored, MD 01/11/2013 12:51 PM

## 2013-01-11 NOTE — Telephone Encounter (Signed)
All I can find in chart is old flu vaccine and Td-- ? If more in her centricity or paper chart or a prev physician's office? I put this back in IN box

## 2013-01-11 NOTE — Patient Instructions (Addendum)
REFERRAL: GO THE THE FRONT ROOM AT THE ENTRANCE OF OUR CLINIC, NEAR CHECK IN. ASK FOR MARION. SHE WILL HELP YOU SET UP YOUR REFERRAL. DATE: TIME:  

## 2013-01-11 NOTE — Telephone Encounter (Signed)
Form placed in your inbox

## 2013-01-11 NOTE — Telephone Encounter (Signed)
Marie with [facility] [place] called report DVT study right leg; negative. Heather advised to tell Hilda Lias to notify pt negative and pt can go home. Hilda Lias voiced understanding.

## 2013-01-12 ENCOUNTER — Encounter: Payer: Self-pay | Admitting: Family Medicine

## 2013-01-16 ENCOUNTER — Emergency Department (HOSPITAL_COMMUNITY): Payer: 59

## 2013-01-16 ENCOUNTER — Encounter (HOSPITAL_COMMUNITY): Payer: Self-pay | Admitting: *Deleted

## 2013-01-16 ENCOUNTER — Emergency Department (HOSPITAL_COMMUNITY)
Admission: EM | Admit: 2013-01-16 | Discharge: 2013-01-16 | Disposition: A | Discharge status: Home / Self Care | Payer: 59 | Attending: Emergency Medicine | Admitting: Emergency Medicine

## 2013-01-16 DIAGNOSIS — R109 Unspecified abdominal pain: Secondary | ICD-10-CM

## 2013-01-16 DIAGNOSIS — R1084 Generalized abdominal pain: Secondary | ICD-10-CM | POA: Insufficient documentation

## 2013-01-16 DIAGNOSIS — Z8679 Personal history of other diseases of the circulatory system: Secondary | ICD-10-CM | POA: Insufficient documentation

## 2013-01-16 DIAGNOSIS — Z79899 Other long term (current) drug therapy: Secondary | ICD-10-CM | POA: Insufficient documentation

## 2013-01-16 DIAGNOSIS — Z8719 Personal history of other diseases of the digestive system: Secondary | ICD-10-CM | POA: Insufficient documentation

## 2013-01-16 DIAGNOSIS — K2970 Gastritis, unspecified, without bleeding: Secondary | ICD-10-CM

## 2013-01-16 DIAGNOSIS — K2990 Gastroduodenitis, unspecified, without bleeding: Secondary | ICD-10-CM | POA: Insufficient documentation

## 2013-01-16 DIAGNOSIS — Z8709 Personal history of other diseases of the respiratory system: Secondary | ICD-10-CM | POA: Insufficient documentation

## 2013-01-16 DIAGNOSIS — Z8739 Personal history of other diseases of the musculoskeletal system and connective tissue: Secondary | ICD-10-CM | POA: Insufficient documentation

## 2013-01-16 DIAGNOSIS — Z8711 Personal history of peptic ulcer disease: Secondary | ICD-10-CM | POA: Insufficient documentation

## 2013-01-16 LAB — COMPREHENSIVE METABOLIC PANEL
ALT: 16 U/L (ref 0–35)
AST: 23 U/L (ref 0–37)
Albumin: 3.7 g/dL (ref 3.5–5.2)
Alkaline Phosphatase: 115 U/L (ref 39–117)
BUN: 14 mg/dL (ref 6–23)
CO2: 27 mEq/L (ref 19–32)
Calcium: 9.7 mg/dL (ref 8.4–10.5)
Chloride: 104 mEq/L (ref 96–112)
Creatinine, Ser: 0.97 mg/dL (ref 0.50–1.10)
GFR calc Af Amer: 76 mL/min — ABNORMAL LOW (ref >90)
GFR calc non Af Amer: 66 mL/min — ABNORMAL LOW (ref >90)
Glucose, Bld: 90 mg/dL (ref 70–99)
Potassium: 3.8 mEq/L (ref 3.5–5.1)
Sodium: 142 mEq/L (ref 135–145)
Total Bilirubin: 0.2 mg/dL — ABNORMAL LOW (ref 0.3–1.2)
Total Protein: 7.8 g/dL (ref 6.0–8.3)

## 2013-01-16 LAB — CBC WITH DIFFERENTIAL/PLATELET
Basophils Absolute: 0 10*3/uL (ref 0.0–0.1)
Basophils Relative: 0 % (ref 0–1)
Eosinophils Absolute: 0.1 10*3/uL (ref 0.0–0.7)
Eosinophils Relative: 2 % (ref 0–5)
HCT: 42 % (ref 36.0–46.0)
Hemoglobin: 13.2 g/dL (ref 12.0–15.0)
Lymphocytes Relative: 42 % (ref 12–46)
Lymphs Abs: 2 10*3/uL (ref 0.7–4.0)
MCH: 24.9 pg — ABNORMAL LOW (ref 26.0–34.0)
MCHC: 31.4 g/dL (ref 30.0–36.0)
MCV: 79.2 fL (ref 78.0–100.0)
Monocytes Absolute: 0.4 10*3/uL (ref 0.1–1.0)
Monocytes Relative: 8 % (ref 3–12)
Neutro Abs: 2.3 10*3/uL (ref 1.7–7.7)
Neutrophils Relative %: 48 % (ref 43–77)
Platelets: 212 10*3/uL (ref 150–400)
RBC: 5.3 MIL/uL — ABNORMAL HIGH (ref 3.87–5.11)
RDW: 13.7 % (ref 11.5–15.5)
WBC: 4.8 10*3/uL (ref 4.0–10.5)

## 2013-01-16 LAB — URINALYSIS, ROUTINE W REFLEX MICROSCOPIC
Bilirubin Urine: NEGATIVE
Glucose, UA: NEGATIVE mg/dL
Hgb urine dipstick: NEGATIVE
Ketones, ur: NEGATIVE mg/dL
Nitrite: NEGATIVE
Protein, ur: NEGATIVE mg/dL
Specific Gravity, Urine: 1.009 (ref 1.005–1.030)
Urobilinogen, UA: 0.2 mg/dL (ref 0.0–1.0)
pH: 8 (ref 5.0–8.0)

## 2013-01-16 LAB — URINE MICROSCOPIC-ADD ON

## 2013-01-16 LAB — LIPASE, BLOOD: Lipase: 32 U/L (ref 11–59)

## 2013-01-16 MED ORDER — IOHEXOL 300 MG/ML  SOLN
100.0000 mL | Freq: Once | INTRAMUSCULAR | Status: AC | PRN
  Administered 2013-01-16: 80 mL via INTRAVENOUS

## 2013-01-16 MED ORDER — ESOMEPRAZOLE MAGNESIUM 20 MG PO CPDR: 20.0000 mg | DELAYED_RELEASE_CAPSULE | Freq: Every day | ORAL | Status: DC

## 2013-01-16 MED ORDER — SODIUM CHLORIDE 0.9 % IV BOLUS (SEPSIS)
1000.0000 mL | Freq: Once | INTRAVENOUS | Status: AC
  Administered 2013-01-16: 1000 mL via INTRAVENOUS

## 2013-01-16 MED ORDER — IOHEXOL 300 MG/ML  SOLN
25.0000 mL | INTRAMUSCULAR | Status: AC
  Administered 2013-01-16 (×2): 25 mL via ORAL

## 2013-01-16 MED ORDER — SUCRALFATE 1 G PO TABS: 1.0000 g | ORAL_TABLET | Freq: Four times a day (QID) | ORAL | Status: DC

## 2013-01-16 MED ORDER — HYDROCODONE-ACETAMINOPHEN 5-325 MG PO TABS: 1.0000 | ORAL_TABLET | Freq: Four times a day (QID) | ORAL | Status: DC | PRN

## 2013-01-16 MED ORDER — MORPHINE SULFATE 4 MG/ML IJ SOLN
4.0000 mg | Freq: Once | INTRAMUSCULAR | Status: AC
  Administered 2013-01-16: 4 mg via INTRAVENOUS
  Filled 2013-01-16: qty 1

## 2013-01-16 MED ORDER — GI COCKTAIL ~~LOC~~
30.0000 mL | Freq: Once | ORAL | Status: AC
  Administered 2013-01-16: 30 mL via ORAL
  Filled 2013-01-16: qty 30

## 2013-01-16 MED ORDER — PANTOPRAZOLE SODIUM 40 MG PO TBEC
40.0000 mg | DELAYED_RELEASE_TABLET | Freq: Every day | ORAL | Status: DC
  Administered 2013-01-16: 40 mg via ORAL
  Filled 2013-01-16: qty 1

## 2013-01-16 NOTE — Telephone Encounter (Signed)
In IN box, thanks

## 2013-01-16 NOTE — Telephone Encounter (Signed)
That's all pt needed on the form is the Td booster, please sign form and pt said she will pick it up tomorrow

## 2013-01-16 NOTE — ED Provider Notes (Signed)
History     CSN: [id number]  Arrival date & time 01/16/13  8295   First MD Initiated Contact with Patient 01/16/13 1012      Chief Complaint  Patient presents with  . Abdominal Pain    (Consider location/radiation/quality/duration/timing/severity/associated sxs/prior treatment) HPI Patient presents emergency department with epigastric pain that is like a burning gnawing type pain.  Patient states that this began 2 weeks ago and since been persistent over that time.  Patient denies chest pain, shortness breath, headache, weakness, vomiting, diarrhea, nausea, blurred vision, fever, or back pain.  Patient, states that she did not try anything for her symptoms.  Patient, states that she had been treated for an ulcer in the past.  Patient, states that eating makes her symptoms worse    Past Medical History  Diagnosis Date  . Headache   . GERD (gastroesophageal reflux disease)   . Plantar fasciitis   . History of nephrolithiasis   . Hypertension   . Back pain   . Ulcer 1980's    peptic ulcer disease  . Sleep apnea     Past Surgical History  Procedure Laterality Date  . Cholecystectomy  1984  . Abdominal hysterectomy  2002    partail one ovary remains  . Oophorectomy    . Colonoscopy  12/18/2011    normal    Family History  Problem Relation Age of Onset  . Stroke Father   . Hypertension Father   . Diabetes Father   . Cancer Brother     stomach tumor    History  Substance Use Topics  . Smoking status: Never Smoker   . Smokeless tobacco: Never Used  . Alcohol Use: No    OB History   Grav Para Term Preterm Abortions TAB SAB Ect Mult Living                  Review of Systems All other systems negative except as documented in the HPI. All pertinent positives and negatives as reviewed in the HPI.  Allergies  Atenolol  Home Medications   Current Outpatient Rx  Name  Route  Sig  Dispense  Refill  . aluminum & magnesium hydroxide-simethicone (MYLANTA)  500-450-40 MG/5ML suspension   Oral   Take 30 mLs by mouth every 6 (six) hours as needed for indigestion.         Marland Kitchen amLODipine (NORVASC) 5 MG tablet   Oral   Take 1 tablet (5 mg total) by mouth daily.   30 tablet   5   . calcium carbonate (TUMS EX) 750 MG chewable tablet   Oral   Chew 1 tablet by mouth daily as needed for heartburn.         . celecoxib (CELEBREX) 200 MG capsule   Oral   Take 200 mg by mouth daily as needed for pain.          . cephALEXin (KEFLEX) 500 MG capsule   Oral   Take 1,000 mg by mouth 2 (two) times daily. Started on 01-11-14; for 10 days         . Cholecalciferol (VITAMIN D3) 2000 UNITS capsule   Oral   Take 2,000 Units by mouth daily.           Marland Kitchen gabapentin (NEURONTIN) 300 MG capsule   Oral   Take 1 capsule (300 mg total) by mouth 3 (three) times daily.   90 capsule   3   . traMADol (ULTRAM) 50 MG tablet  Oral   Take 1 tablet (50 mg total) by mouth every 8 (eight) hours as needed for pain. Takes 1-2 tablets every 6-8 hours as needed for pain   30 tablet   2   . esomeprazole (NEXIUM) 20 MG capsule   Oral   Take 1 capsule (20 mg total) by mouth daily.   30 capsule   0   . HYDROcodone-acetaminophen (NORCO/VICODIN) 5-325 MG per tablet   Oral   Take 1 tablet by mouth every 6 (six) hours as needed for pain.   15 tablet   0   . sucralfate (CARAFATE) 1 G tablet   Oral   Take 1 tablet (1 g total) by mouth 4 (four) times daily.   28 tablet   0     BP 132/77  Pulse 72  Temp(Src) 97.9 F (36.6 C) (Oral)  Resp 20  SpO2 100%  Physical Exam  Constitutional: She is oriented to person, place, and time. She appears well-developed and well-nourished.  HENT:  Head: Normocephalic and atraumatic.  Eyes: Pupils are equal, round, and reactive to light.  Neck: Normal range of motion. Neck supple.  Cardiovascular: Normal rate, regular rhythm and normal heart sounds.   Pulmonary/Chest: Effort normal and breath sounds normal.  Abdominal:  Soft. Normal appearance and bowel sounds are normal. She exhibits no distension. There is generalized tenderness. There is no rebound and no guarding.    Neurological: She is alert and oriented to person, place, and time.  Skin: Skin is warm and dry.    ED Course  Procedures (including critical care time)  Labs Reviewed  CBC WITH DIFFERENTIAL - Abnormal; Notable for the following:    RBC 5.30 (*)    MCH 24.9 (*)    All other components within normal limits  COMPREHENSIVE METABOLIC PANEL - Abnormal; Notable for the following:    Total Bilirubin 0.2 (*)    GFR calc non Af Amer 66 (*)    GFR calc Af Amer 76 (*)    All other components within normal limits  URINALYSIS, ROUTINE W REFLEX MICROSCOPIC - Abnormal; Notable for the following:    APPearance HAZY (*)    Leukocytes, UA SMALL (*)    All other components within normal limits  LIPASE, BLOOD  URINE MICROSCOPIC-ADD ON   Ct Abdomen Pelvis W Contrast  01/16/2013  *RADIOLOGY REPORT*  Clinical Data: Intermittent abdominal pain.  Hypertension.  History of reflux and ulcer.  Prior cholecystectomy, hysterectomy and oophorectomy.  CT ABDOMEN AND PELVIS WITH CONTRAST  Technique:  Multidetector CT imaging of the abdomen and pelvis was performed following the standard protocol during bolus administration of intravenous contrast.  Contrast: 80mL OMNIPAQUE IOHEXOL 300 MG/ML  SOLN  Comparison: None.  Findings: Lung bases clear.  No extraluminal bowel inflammatory process, free fluid or free air. Specifically no inflammation surrounds the appendix.  Portions of the stomach, descending colon and sigmoid colon are not distended and evaluation limited.  Suggestion of small hiatal hernia.  Scattered small low density structures within the liver.  Larger lesions appear to be cysts, some smaller lesions too small to adequately characterize.  Intrahepatic biliary ducts appear prominent.  This may be related to the patient's cholecystectomy state as no calcified  common bile duct stone or pancreatic head mass is identified.  Correlation with liver function studies recommended.  No focal splenic, pancreatic, adrenal or renal lesion.  No abdominal aortic aneurysm.  Retroaortic left renal vein incidentally noted.  No adenopathy.  Noncontrast filled partially  decompressed urinary bladder without gross abnormality.  No bony destructive lesion.  Mild diastasis of the rectus muscles without bowel containing hernia.  IMPRESSION: No extraluminal bowel inflammatory process, free fluid or free air. Specifically no inflammation surrounds the appendix.  Portions of the stomach, descending colon and sigmoid colon are not distended and evaluation limited.  Suggestion of small hiatal hernia.  Intrahepatic biliary ducts appear prominent.  This may be related to the patient's cholecystectomy state as no calcified common bile duct stone or pancreatic head mass is identified.  Correlation with liver function studies recommended.  Please see above.   Original Report Authenticated By: Lacy Duverney, M.D.      1. Gastritis   2. Abdominal pain    Patient had generalized pain on exam that is why we obtained a CT scan.  The patient will be referred to GI and her primary care Dr. for further evaluation.  Also, placed her on medication for peptic ulcer.  Told to return here as needed.  For any worsening in her condition.  The patient was feeling better after GI cocktail and Protonix.   MDM  MDM Reviewed: nursing note and vitals Interpretation: labs and CT scan            Carlyle Dolly, PA-C 01/17/13 1607

## 2013-01-16 NOTE — Telephone Encounter (Signed)
Form in your inbox 

## 2013-01-16 NOTE — ED Notes (Signed)
Patient states intermittent abdominal pain, denies n/v/d, patient states she used to be on acid reflux meds but was taken off by pcp and now has increasing pain in epigastric area

## 2013-01-16 NOTE — Telephone Encounter (Signed)
Check in Cent[facility] and her paper chart and we don't have any record of her immunizations, called pt to advise her of that and no answer so left voicemail requesting pt to call office

## 2013-01-17 NOTE — Telephone Encounter (Signed)
Left voicemail letting pt know form ready for pick up 

## 2013-01-20 NOTE — ED Provider Notes (Signed)
Medical screening examination/treatment/procedure(s) were performed by non-physician practitioner and as supervising physician I was immediately available for consultation/collaboration.  Courtenay Hirth K Linker, MD 01/20/13 1502 

## 2013-02-12 ENCOUNTER — Telehealth: Payer: Self-pay | Admitting: Family Medicine

## 2013-02-12 DIAGNOSIS — E559 Vitamin D deficiency, unspecified: Secondary | ICD-10-CM

## 2013-02-12 DIAGNOSIS — Z0000 Encounter for general adult medical examination without abnormal findings: Secondary | ICD-10-CM

## 2013-02-12 NOTE — Telephone Encounter (Signed)
Message copied by Judy Pimple on Sun Feb 12, 2013  3:29 PM ------      Message from: Alvina Chou      Created: Tue Feb 07, 2013 11:48 AM      Regarding: Lab orders for Monday 3.10.14       Patient is scheduled for CPX labs, please order future labs, Thanks , Terri       ------

## 2013-02-13 ENCOUNTER — Other Ambulatory Visit: Payer: BC Managed Care – PPO

## 2013-02-13 ENCOUNTER — Other Ambulatory Visit: Payer: Self-pay

## 2013-02-13 ENCOUNTER — Other Ambulatory Visit (INDEPENDENT_AMBULATORY_CARE_PROVIDER_SITE_OTHER): Payer: 59

## 2013-02-13 DIAGNOSIS — Z0000 Encounter for general adult medical examination without abnormal findings: Secondary | ICD-10-CM

## 2013-02-13 DIAGNOSIS — E559 Vitamin D deficiency, unspecified: Secondary | ICD-10-CM

## 2013-02-13 LAB — COMPREHENSIVE METABOLIC PANEL
Alkaline Phosphatase: 82 U/L (ref 39–117)
BUN: 14 mg/dL (ref 6–23)
Creatinine, Ser: 0.8 mg/dL (ref 0.4–1.2)
Glucose, Bld: 93 mg/dL (ref 70–99)
Sodium: 141 mEq/L (ref 135–145)
Total Bilirubin: 0.4 mg/dL (ref 0.3–1.2)

## 2013-02-13 LAB — LIPID PANEL
Cholesterol: 191 mg/dL (ref 0–200)
HDL: 72.2 mg/dL (ref >39.00)
LDL Cholesterol: 106 mg/dL — ABNORMAL HIGH (ref 0–99)
Triglycerides: 64 mg/dL (ref 0.0–149.0)
VLDL: 12.8 mg/dL (ref 0.0–40.0)

## 2013-02-13 LAB — CBC WITH DIFFERENTIAL/PLATELET
Eosinophils Relative: 1.5 % (ref 0.0–5.0)
Hemoglobin: 13.1 g/dL (ref 12.0–15.0)
Lymphs Abs: 2.1 10*3/uL (ref 0.7–4.0)
MCHC: 32.8 g/dL (ref 30.0–36.0)
Monocytes Absolute: 0.4 10*3/uL (ref 0.1–1.0)
Neutro Abs: 2.8 10*3/uL (ref 1.4–7.7)
RBC: 5.16 Mil/uL — ABNORMAL HIGH (ref 3.87–5.11)
RDW: 13.5 % (ref 11.5–14.6)

## 2013-02-20 ENCOUNTER — Encounter: Payer: Self-pay | Admitting: Family Medicine

## 2013-02-20 ENCOUNTER — Ambulatory Visit (INDEPENDENT_AMBULATORY_CARE_PROVIDER_SITE_OTHER): Payer: 59 | Admitting: Family Medicine

## 2013-02-20 VITALS — BP 122/82 | HR 97 | Temp 98.4°F | Ht 61.5 in | Wt 166.5 lb

## 2013-02-20 DIAGNOSIS — I10 Essential (primary) hypertension: Secondary | ICD-10-CM

## 2013-02-20 DIAGNOSIS — E559 Vitamin D deficiency, unspecified: Secondary | ICD-10-CM

## 2013-02-20 DIAGNOSIS — Z0000 Encounter for general adult medical examination without abnormal findings: Secondary | ICD-10-CM

## 2013-02-20 DIAGNOSIS — K219 Gastro-esophageal reflux disease without esophagitis: Secondary | ICD-10-CM

## 2013-02-20 MED ORDER — ESOMEPRAZOLE MAGNESIUM 20 MG PO CPDR: 20.0000 mg | DELAYED_RELEASE_CAPSULE | Freq: Every day | ORAL | Status: DC

## 2013-02-20 NOTE — Progress Notes (Signed)
Subjective:    Patient ID: Mindy Long, female    DOB: 1961/07/28, 52 y.o.   MRN: [id number]  HPI Here for health maintenance exam and to review chronic medical problems    Has been fair in general  Had a bad flare in her abdominal pain - and went to the ER/ hospital  Is on nexium  Has hx of peptic ulcer - not anemic currently  Doing pretty well on nexium- and no further episodes  She only took carafate the next week  She is still taking celebrex - because her plantar fasciitis flared up again  Takes it about twice per week Has been on it about a year or so She sees Dr Al Corpus for that   Since she started back on nexium- headaches did not get worse   Wt is down 4 lb with bmi of 30 Trying to change her eating habits- she is in a nutrition class (drinking more water, cut out carbonation, and only caff once per day)   1/13 colonoscopy  mammo 11/13 Self exam- no lumps or changes   Pap 6/09 Has not seen gyn in several years  Hysterectomy- was for fibroids  She wants to go back for one more visit   Flu vaccine- did not get one  utd Td  Lab Results  Component Value Date   CHOL 191 02/13/2013   CHOL 214* 10/23/2011   CHOL 230* 06/20/2009   Lab Results  Component Value Date   HDL 72.20 02/13/2013   HDL 14.78 10/23/2011   HDL 84.20 06/20/2009   Lab Results  Component Value Date   LDLCALC 106* 02/13/2013   Lab Results  Component Value Date   TRIG 64.0 02/13/2013   TRIG 64.0 10/23/2011   TRIG 43.0 06/20/2009   Lab Results  Component Value Date   CHOLHDL 3 02/13/2013   CHOLHDL 3 10/23/2011   CHOLHDL 3 06/20/2009   Lab Results  Component Value Date   LDLDIRECT 126.7 10/23/2011   LDLDIRECT 132.9 06/20/2009   LDLDIRECT 109.4 01/25/2008   looks pretty good   Hx of D def level is 37  Patient Active Problem List  Diagnosis  . UNSPECIFIED VITAMIN D DEFICIENCY  . HYPOKALEMIA  . ESSENTIAL HYPERTENSION  . ALLERGIC RHINITIS  . G E R D  . PEPTIC ULCER DISEASE  . BACK  PAIN  . PLANTAR FASCIITIS  . DISTURBANCE OF SKIN SENSATION  . Headache, chronic daily  . PALPITATIONS  . NEPHROLITHIASIS, HX OF  . PERIMENOPAUSAL STATUS  . Foot pain  . Routine general medical examination at a health care facility  . Colon cancer screening  . BPPV (benign paroxysmal positional vertigo)  . Fatigue  . Snoring  . Frequent urination  . OSA (obstructive sleep apnea)   Past Medical History  Diagnosis Date  . Headache   . GERD (gastroesophageal reflux disease)   . Plantar fasciitis   . History of nephrolithiasis   . Hypertension   . Back pain   . Ulcer 1980's    peptic ulcer disease  . Sleep apnea    Past Surgical History  Procedure Laterality Date  . Cholecystectomy  1984  . Abdominal hysterectomy  2002    partail one ovary remains  . Oophorectomy    . Colonoscopy  12/18/2011    normal   History  Substance Use Topics  . Smoking status: Never Smoker   . Smokeless tobacco: Never Used  . Alcohol Use: No   Family History  Problem  Relation Age of Onset  . Stroke Father   . Hypertension Father   . Diabetes Father   . Cancer Brother     stomach tumor   Allergies  Allergen Reactions  . Atenolol Cough   Current Outpatient Prescriptions on File Prior to Visit  Medication Sig Dispense Refill  . aluminum & magnesium hydroxide-simethicone (MYLANTA) 500-450-40 MG/5ML suspension Take 30 mLs by mouth every 6 (six) hours as needed for indigestion.      Marland Kitchen amLODipine (NORVASC) 5 MG tablet Take 1 tablet (5 mg total) by mouth daily.  30 tablet  5  . calcium carbonate (TUMS EX) 750 MG chewable tablet Chew 1 tablet by mouth daily as needed for heartburn.      . celecoxib (CELEBREX) 200 MG capsule Take 200 mg by mouth daily as needed for pain.       . Cholecalciferol (VITAMIN D3) 2000 UNITS capsule Take 2,000 Units by mouth daily.        Marland Kitchen gabapentin (NEURONTIN) 300 MG capsule Take 1 capsule (300 mg total) by mouth 3 (three) times daily.  90 capsule  3  .  HYDROcodone-acetaminophen (NORCO/VICODIN) 5-325 MG per tablet Take 1 tablet by mouth every 6 (six) hours as needed for pain.  15 tablet  0  . traMADol (ULTRAM) 50 MG tablet Take 1 tablet (50 mg total) by mouth every 8 (eight) hours as needed for pain. Takes 1-2 tablets every 6-8 hours as needed for pain  30 tablet  2  . sucralfate (CARAFATE) 1 G tablet Take 1 tablet (1 g total) by mouth 4 (four) times daily.  28 tablet  0   No current facility-administered medications on file prior to visit.    Review of Systems Review of Systems  Constitutional: Negative for fever, appetite change, fatigue and unexpected weight change.  Eyes: Negative for pain and visual disturbance.  Respiratory: Negative for cough and shortness of breath.   Cardiovascular: Negative for cp or palpitations    Gastrointestinal: Negative for nausea, diarrhea and constipation. neg for heartburn or abdominal pain  Genitourinary: Negative for urgency and frequency.  Skin: Negative for pallor or rash   Neurological: Negative for weakness, light-headedness, numbness and headaches.  Hematological: Negative for adenopathy. Does not bruise/bleed easily.  Psychiatric/Behavioral: Negative for dysphoric mood. The patient is not nervous/anxious.         Objective:   Physical Exam  Constitutional: She appears well-developed and well-nourished. No distress.  overwt and well appearing   HENT:  Head: Normocephalic and atraumatic.  Right Ear: External ear normal.  Left Ear: External ear normal.  Nose: Nose normal.  Mouth/Throat: Oropharynx is clear and moist.  Eyes: Conjunctivae and EOM are normal. Pupils are equal, round, and reactive to light. Right eye exhibits no discharge. Left eye exhibits no discharge. No scleral icterus.  Neck: Normal range of motion. Neck supple. No JVD present. Carotid bruit is not present. No thyromegaly present.  Cardiovascular: Normal rate, regular rhythm, normal heart sounds and intact distal pulses.   Exam reveals no gallop.   Pulmonary/Chest: Effort normal and breath sounds normal. No respiratory distress. She has no wheezes. She has no rales.  Abdominal: Soft. Bowel sounds are normal. She exhibits no distension, no abdominal bruit and no mass. There is no tenderness.  Genitourinary: No breast swelling, tenderness, discharge or bleeding.  Breast exam: No mass, nodules, thickening, tenderness, bulging, retraction, inflamation, nipple discharge or skin changes noted.  No axillary or clavicular LA.  Chaperoned exam.  Musculoskeletal: She exhibits no edema and no tenderness.  Lymphadenopathy:    She has no cervical adenopathy.  Neurological: She is alert. She has normal reflexes. No cranial nerve deficit. She exhibits normal muscle tone. Coordination normal.  Skin: Skin is warm and dry. No rash noted. No erythema. No pallor.  Psychiatric: She has a normal mood and affect.          Assessment & Plan:

## 2013-02-20 NOTE — Assessment & Plan Note (Signed)
Level is 37 with supplementation Enc to continue that

## 2013-02-20 NOTE — Assessment & Plan Note (Signed)
This was worse- now back to normal with nexium and since it does not cause ha -will continue it  Disc diet -will stop caffeine  Also doing better with lifestyle change  Update if further problems Will minimize celebrex unless necessary

## 2013-02-20 NOTE — Patient Instructions (Signed)
Get away from caffeine entirely - it worsens reflux/ stomach problems and causes palpitations I'm glad your stomach is better - if it gets worse again , let me know and stay on the nexium  Keep up the good diet and exercise changes See your gyn for a visit and see what they recommend for future screening  Cholesterol improved Try low impact exercise like bike or water exercise

## 2013-02-20 NOTE — Assessment & Plan Note (Signed)
Reviewed health habits including diet and exercise and skin cancer prevention Also reviewed health mt list, fam hx and immunizations  Rev wellness labs today

## 2013-02-20 NOTE — Assessment & Plan Note (Signed)
bp in fair control at this time  No changes needed  Disc lifstyle change with low sodium diet and exercise   Rev labs 

## 2013-03-22 ENCOUNTER — Other Ambulatory Visit: Payer: Self-pay | Admitting: Family Medicine

## 2013-03-22 MED ORDER — ESOMEPRAZOLE MAGNESIUM 20 MG PO CPDR: 20.0000 mg | DELAYED_RELEASE_CAPSULE | Freq: Every day | ORAL | Status: DC

## 2013-03-23 ENCOUNTER — Encounter: Payer: Self-pay | Admitting: Family Medicine

## 2013-03-23 ENCOUNTER — Ambulatory Visit (INDEPENDENT_AMBULATORY_CARE_PROVIDER_SITE_OTHER): Payer: 59 | Admitting: Family Medicine

## 2013-03-23 VITALS — BP 120/80 | HR 89 | Temp 98.3°F | Wt 171.0 lb

## 2013-03-23 DIAGNOSIS — G8929 Other chronic pain: Secondary | ICD-10-CM

## 2013-03-23 DIAGNOSIS — M549 Dorsalgia, unspecified: Secondary | ICD-10-CM

## 2013-03-23 MED ORDER — HYDROCODONE-ACETAMINOPHEN 5-325 MG PO TABS: 1.0000 | ORAL_TABLET | Freq: Four times a day (QID) | ORAL | Status: DC | PRN

## 2013-03-23 NOTE — Patient Instructions (Addendum)
REFERRAL: GO THE THE FRONT ROOM AT THE ENTRANCE OF OUR CLINIC, NEAR CHECK IN. ASK FOR Mindy Long. SHE WILL HELP YOU SET UP YOUR REFERRAL. DATE: TIME:  

## 2013-03-23 NOTE — Progress Notes (Signed)
Nature conservation officer at [facility] [address] [place] [address] Phone: [phone] Fax: [phone]  Date:  03/23/2013   Name:  Mindy Long   DOB:  [birth year]   MRN:  [id number] Gender: female Age: 51 y.o.  Primary Physician:  Roxy Manns, MD  Evaluating MD: Hannah Beat, MD   Chief Complaint: Back Pain   History of Present Illness:  Mindy Long is a 52 y.o. pleasant patient who presents with the following:  Seems like back pain has been getting worse. Some slight pain up and down on the L.  This is a patient with a multiyear history of chronic back pain intermittently. She has been undergoing standard conservative management protocols for multiple years. He has been on anti-inflammatories for prolonged amounts of time, and required some occasional intermittent narcotics. She also has been on multiple muscle relaxants, and has been through several rounds of prednisone. She also has been taking neuropathic pain medications including gabapentin for some time.  The patient has also done formal physical therapy, and she is also done chiropractic manipulation in the past. At this point, the patient's pain over the last 6 months has been gradually worsening to the point where it is virtually incapacitating for her. This is the 1st time I've seen her in some time. In the past she has had epidural steroid injections with some good success.  She denies bowel or bladder incontinence. No saddle anesthesia. She does have some radicular pain down the LEFT leg. No weakness.  01/2013 CT.  07/28/2012 OV Back, has flared up again, was controlling on medication. Patient is a history of some chronic back pain, with failure to improve despite multiple treatments, and her back has flared up worse in the last 2-3 weeks.  09/17/2011 OV: 52 year old patient with f/u acute on chronic LBP with LBP going back at least several years. Has been through a round of 5 ESI in [place] several years  ago. Most recently, her back started to feel better and overall it is better now than before, but she has been having repetitive flares.   Currently on Prednisone 10 mg from a dose pack from Dr. Al Corpus. She did not take her burst and taper properly that I previously rx (half of pills remain) Has taken some Flexeril, Zanaflex, Vicodin, Tramadol, Gabapentin without improvement. Unclear with history - but they all made her sleepy.  It got better after prednisone burst, then it flared up again. Spoke to Dr. Milinda Antis --- Dr. Al Corpus put on prednisone. In the middle of back.   No numbness, tingling, bowel or bladder incont.  Prior OV Was in the closet, then felt some acute pain in her low back. Yesterday. Mostly in the low back.   All on the right - moving to the upper part. No lower radiculopathy  Heating pad - helped a little Ibuprofen.   Never was fully asymptomatic. Has had some LBP for a number of years -- not like this. Yesterday. 9/10 pain.   Not working.   Takes care of grandchildren: 61 and 45 yo.  Radiology: MRI LUMBAR SPINE WITHOUT CONTRAST   Technique:  Multiplanar and multiecho pulse sequences of the lumbar spine were obtained without intravenous contrast.   Comparison: None.   Findings: Vertebral body height, signal and alignment are maintained.  The conus medullaris is normal in signal and position. Imaged intra-abdominal contents are unremarkable.   The T11-12 level is imaged in the sagittal plane only and negative.  T12-L1:  Negative.   L1-2:  Negative.   L2-3:  Mild facet degenerative disease. Otherwise negative.   L3-4:  Mild facet degenerative disease.  Otherwise negative.   L4-5:  Slight disc bulge with mild to moderate facet degenerative change, worse on the left.  Central spinal canal and neural foramen remain open.   L5-S1:  Minimal disc bulge.  Central canal and foramina widely patent.   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  There is facet arthropathy appearing most notable at L4-5 on the left.   Original Report Authenticated By: Bernadene Bell. D'ALESSIO, M.D.   *RADIOLOGY REPORT*   Clinical Data: Intermittent abdominal pain.  Hypertension.  History of reflux and ulcer.  Prior cholecystectomy, hysterectomy and oophorectomy.   CT ABDOMEN AND PELVIS WITH CONTRAST   Technique:  Multidetector CT imaging of the abdomen and pelvis was performed following the standard protocol during bolus administration of intravenous contrast.   Contrast: 80mL OMNIPAQUE IOHEXOL 300 MG/ML  SOLN   Comparison: None.   Findings: Lung bases clear.   No extraluminal bowel inflammatory process, free fluid or free air. Specifically no inflammation surrounds the appendix.  Portions of the stomach, descending colon and sigmoid colon are not distended and evaluation limited.   Suggestion of small hiatal hernia.   Scattered small low density structures within the liver.  Larger lesions appear to be cysts, some smaller lesions too small to adequately characterize.   Intrahepatic biliary ducts appear prominent.  This may be related to the patient's cholecystectomy state as no calcified common bile duct stone or pancreatic head mass is identified.  Correlation with liver function studies recommended.   No focal splenic, pancreatic, adrenal or renal lesion.   No abdominal aortic aneurysm.  Retroaortic left renal vein incidentally noted.   No adenopathy.   Noncontrast filled partially decompressed urinary bladder without gross abnormality.   No bony destructive lesion.   Mild diastasis of the rectus muscles without bowel containing hernia.   IMPRESSION: No extraluminal bowel inflammatory process, free fluid or free air. Specifically no inflammation surrounds the appendix.  Portions of the stomach, descending colon and sigmoid colon are not distended and evaluation limited.    Suggestion of small hiatal hernia.   Intrahepatic biliary ducts appear prominent.  This may be related to the patient's cholecystectomy state as no calcified common bile duct stone or pancreatic head mass is identified.  Correlation with liver function studies recommended.   Please see above.     Original Report Authenticated By: Lacy Duverney, M.D.  Independent interpretation: the patient's CT of the abdomen and pelvis from 01/2013 was independently reviewed by myself at the time of her office visit in the examination room with the patient. Bone windows were reviewed. There is no evidence for occult fracture. There is some mild evidence of facet arthropathy from L3-S1. There pillars to be minimal but no degenerative disc disease in the portion of the lumbar spine. Soft tissue pathology as detailed above.  Patient Active Problem List  Diagnosis  . UNSPECIFIED VITAMIN D DEFICIENCY  . HYPOKALEMIA  . ESSENTIAL HYPERTENSION  . ALLERGIC RHINITIS  . G E R D  . PEPTIC ULCER DISEASE  . BACK PAIN  . PLANTAR FASCIITIS  . DISTURBANCE OF SKIN SENSATION  . Headache, chronic daily  . PALPITATIONS  . NEPHROLITHIASIS, HX OF  . PERIMENOPAUSAL STATUS  . Foot pain  . Routine general medical examination at a health care facility  .  Colon cancer screening  . BPPV (benign paroxysmal positional vertigo)  . Fatigue  . Snoring  . Frequent urination  . OSA (obstructive sleep apnea)    Past Medical History  Diagnosis Date  . Headache   . GERD (gastroesophageal reflux disease)   . Plantar fasciitis   . History of nephrolithiasis   . Hypertension   . Back pain   . Ulcer 1980's    peptic ulcer disease  . Sleep apnea     Past Surgical History  Procedure Laterality Date  . Cholecystectomy  1984  . Abdominal hysterectomy  2002    partail one ovary remains  . Oophorectomy    . Colonoscopy  12/18/2011    normal    History   Social History  . Marital Status: Married    Spouse Name: N/A      Number of Children: N/A  . Years of Education: N/A   Occupational History  . ar specialist    Social History Main Topics  . Smoking status: Never Smoker   . Smokeless tobacco: Never Used  . Alcohol Use: No  . Drug Use: No  . Sexually Active: Not on file   Other Topics Concern  . Not on file   Social History Narrative   Walks for exercise    Family History  Problem Relation Age of Onset  . Stroke Father   . Hypertension Father   . Diabetes Father   . Cancer Brother     stomach tumor    Allergies  Allergen Reactions  . Atenolol Cough    Medication list has been reviewed and updated.  Outpatient Prescriptions Prior to Visit  Medication Sig Dispense Refill  . amLODipine (NORVASC) 5 MG tablet Take 1 tablet (5 mg total) by mouth daily.  30 tablet  5  . celecoxib (CELEBREX) 200 MG capsule Take 200 mg by mouth daily as needed for pain.       . Cholecalciferol (VITAMIN D3) 2000 UNITS capsule Take 2,000 Units by mouth daily.        Marland Kitchen esomeprazole (NEXIUM) 20 MG capsule Take 1 capsule (20 mg total) by mouth daily.  90 capsule  3  . gabapentin (NEURONTIN) 300 MG capsule Take 1 capsule (300 mg total) by mouth 3 (three) times daily.  90 capsule  3  . sucralfate (CARAFATE) 1 G tablet Take 1 tablet (1 g total) by mouth 4 (four) times daily.  28 tablet  0  . traMADol (ULTRAM) 50 MG tablet Take 1 tablet (50 mg total) by mouth every 8 (eight) hours as needed for pain. Takes 1-2 tablets every 6-8 hours as needed for pain  30 tablet  2  . aluminum & magnesium hydroxide-simethicone (MYLANTA) 500-450-40 MG/5ML suspension Take 30 mLs by mouth every 6 (six) hours as needed for indigestion.      . calcium carbonate (TUMS EX) 750 MG chewable tablet Chew 1 tablet by mouth daily as needed for heartburn.      Marland Kitchen HYDROcodone-acetaminophen (NORCO/VICODIN) 5-325 MG per tablet Take 1 tablet by mouth every 6 (six) hours as needed for pain.  15 tablet  0   No facility-administered medications prior  to visit.    Review of Systems:   GEN: No fevers, chills. Nontoxic. Primarily MSK c/o today. MSK: Detailed in the HPI GI: tolerating PO intake without difficulty Neuro: detailed above Otherwise the pertinent positives of the ROS are noted above.    Physical Examination: BP 120/80  Pulse 89  Temp(Src) 98.3  F (36.8 C) (Oral)  Wt 171 lb (77.565 kg)  BMI 31.79 kg/m2  SpO2 99%  Ideal Body Weight:     GEN: Well-developed,well-nourished,in no acute distress; alert,appropriate and cooperative throughout examination HEENT: Normocephalic and atraumatic without obvious abnormalities. Ears, externally no deformities PULM: Breathing comfortably in no respiratory distress EXT: No clubbing, cyanosis, or edema PSYCH: Normally interactive. Cooperative during the interview. Pleasant. Friendly and conversant. Not anxious or depressed appearing. Normal, full affect.  Range of motion at  the waist: Flexion, extension, lateral bending and rotation: the patient has effectively no forward flexion at the waist. At best she was able to bend 5. Limited bending laterally or with extension or rotation due to pain.  No echymosis or edema Rises to examination table with mild difficulty Gait: minimally antalgic  Inspection/Deformity: N Paraspinus Tenderness: diffuse from L2-L5.  B Ankle Dorsiflexion (L5,4): 5/5 B Great Toe Dorsiflexion (L5,4): 5/5 Heel Walk (L5): WNL Toe Walk (S1): WNL Rise/Squat (L4): WNL, mild pain  SENSORY B Medial Foot (L4): WNL B Dorsum (L5): WNL B Lateral (S1): WNL Light Touch: WNL Pinprick: WNL  REFLEXES Knee (L4): 2+ Ankle (S1): 2+  B SLR, seated: neg B SLR, supine: neg B FABER: neg B Greater Troch: NT B Log Roll: neg B Stork: NT B Sciatic Notch: mild TTP  Assessment and Plan:  Chronic back pain greater than 3 months duration - Plan: MR Lumbar Spine Wo Contrast  Acute on chronic back pain, worsening over the last 6 months with failure of management with  conservative management for multiple years. She is essentially exhausted all conservative measures. This is detailed in the history of present illness. CT of 2 months ago does not account for pain. Prior MRI without a great deal of extensive pathology that could explain this level of pain. This patient is markedly functionally limited. Recommend MRI of the lumbar spine without contrast to further evaluate for potential nerve encroachment, spinal stenosis, or worsening in consideration if pathology is present for intervention.  Orders Today:  Orders Placed This Encounter  Procedures  . MR Lumbar Spine Wo Contrast    EPIC ORDER/[id number]/NOT CLAUS/INS-[facility]/CLC/MARION    Standing Status: Future     Number of Occurrences:      Standing Expiration Date: 05/23/2014    Order Specific Question:  Is the patient pregnant?    Answer:  No    Order Specific Question:  Does the patient have a pacemaker, internal devices, implants, aneury    Answer:  No    Order Specific Question:  Preferred imaging location?    Answer:  [id number]    Order Specific Question:  Reason for exam:    Answer:  severe, chronic back pain, worsening    Updated Medication List: (Includes new medications, updates to list, dose adjustments) Meds ordered this encounter  Medications  . traMADol (ULTRAM) 50 MG tablet    Sig: Take 50-100 mg by mouth every 6 (six) hours as needed for pain.  Marland Kitchen HYDROcodone-acetaminophen (NORCO/VICODIN) 5-325 MG per tablet    Sig: Take 1 tablet by mouth every 6 (six) hours as needed for pain.    Dispense:  30 tablet    Refill:  0    Order Specific Question:  Supervising Provider    Answer:  Eber Hong D [[id number]]    Medications Discontinued: Medications Discontinued During This Encounter  Medication Reason  . aluminum & magnesium hydroxide-simethicone (MYLANTA) 500-450-40 MG/5ML suspension   . calcium carbonate (TUMS EX) 750 MG chewable tablet   .  HYDROcodone-acetaminophen (NORCO/VICODIN)  5-325 MG per tablet   . traMADol (ULTRAM) 50 MG tablet       Signed, Damere Brandenburg T. Christan Defranco, MD 03/23/2013 12:05 PM

## 2013-03-29 ENCOUNTER — Ambulatory Visit
Admission: RE | Admit: 2013-03-29 | Discharge: 2013-03-29 | Disposition: A | Discharge status: Home / Self Care | Payer: 59 | Source: Ambulatory Visit | Attending: Family Medicine | Admitting: Family Medicine

## 2013-03-29 DIAGNOSIS — G8929 Other chronic pain: Secondary | ICD-10-CM

## 2013-03-29 DIAGNOSIS — M549 Dorsalgia, unspecified: Secondary | ICD-10-CM

## 2013-04-04 ENCOUNTER — Other Ambulatory Visit: Payer: Self-pay | Admitting: Family Medicine

## 2013-04-04 DIAGNOSIS — M549 Dorsalgia, unspecified: Secondary | ICD-10-CM

## 2013-04-14 ENCOUNTER — Other Ambulatory Visit: Payer: Self-pay | Admitting: Family Medicine

## 2013-04-14 NOTE — Telephone Encounter (Signed)
Px written for call in   

## 2013-04-17 NOTE — Telephone Encounter (Signed)
Prescription called to pharmacy as instructed. 

## 2013-04-26 ENCOUNTER — Telehealth: Payer: Self-pay

## 2013-04-26 NOTE — Telephone Encounter (Signed)
Pt is already taking tramadol, she was taking tramadol and hydrocodone apap, she said is there another pain med you can prescribe

## 2013-04-26 NOTE — Telephone Encounter (Signed)
Pt left v/m requesting substitute pain med for hydrocodone apap to [facility];med causing nausea. Pt request call back.

## 2013-04-26 NOTE — Telephone Encounter (Signed)
Is she taking this for her back pain ? - I see tramadol on her list from the past  - that would be my 2nd choice -still habit forming so use caution Let me know if she wants to try that

## 2013-04-27 NOTE — Telephone Encounter (Signed)
I advise her to go forward with the back injections - and please send a copy of this phone note train to DR Ramos, thanks

## 2013-04-27 NOTE — Telephone Encounter (Signed)
Pt notified of Dr. Milinda Antis recommendations, and phone note faxed to Dr. Ethelene Hal

## 2013-04-27 NOTE — Telephone Encounter (Signed)
No- I reviewed her records and was interested to see if she has seen an orthopedic doctor as Dr Patsy Lager had advised? If not- we should go ahead with a referral

## 2013-04-27 NOTE — Telephone Encounter (Signed)
Pt did go to [facility] and is seeing Dr. Ethelene Hal, he has set up an appt for the pt to start getting back injections but she hasn't had one yet, please advise

## 2013-04-28 ENCOUNTER — Ambulatory Visit: Payer: Self-pay | Admitting: Podiatry

## 2013-05-02 ENCOUNTER — Ambulatory Visit: Payer: Self-pay | Admitting: Podiatry

## 2013-05-24 ENCOUNTER — Other Ambulatory Visit: Payer: Self-pay | Admitting: Family Medicine

## 2013-06-07 ENCOUNTER — Encounter: Payer: 59 | Admitting: Neurology

## 2013-06-08 ENCOUNTER — Ambulatory Visit (INDEPENDENT_AMBULATORY_CARE_PROVIDER_SITE_OTHER): Payer: 59 | Admitting: Neurology

## 2013-06-08 ENCOUNTER — Ambulatory Visit (INDEPENDENT_AMBULATORY_CARE_PROVIDER_SITE_OTHER): Payer: 59

## 2013-06-08 DIAGNOSIS — M79609 Pain in unspecified limb: Secondary | ICD-10-CM

## 2013-06-08 DIAGNOSIS — Z0289 Encounter for other administrative examinations: Secondary | ICD-10-CM

## 2013-06-08 DIAGNOSIS — G544 Lumbosacral root disorders, not elsewhere classified: Secondary | ICD-10-CM

## 2013-06-08 NOTE — Procedures (Signed)
  HISTORY:  Mindy Long is a 52 year old patient with a history of low back pain. The patient has had some problems with discomfort in the feet since 2000. The patient has been sent over for an evaluation of a possible neuropathy or a possible tarsal tunnel syndrome or a lumbosacral radiculopathy. The patient has noted gradual worsening of the pain over time.  NERVE CONDUCTION STUDIES:  Nerve conduction studies were performed on both lower extremities. The distal motor latencies and motor amplitudes for the peroneal and posterior tibial nerves were within normal limits. The nerve conduction velocities for these nerves were also normal. The H reflex latencies were normal. The sensory latencies for the peroneal nerves were within normal limits. The medial and lateral plantar sensory latencies were within normal limits bilaterally.   EMG STUDIES:  EMG study was performed on the left lower extremity:  The tibialis anterior muscle reveals 2 to 4K motor units with full recruitment. No fibrillations or positive waves were seen. The peroneus tertius muscle reveals 2 to 4K motor units with full recruitment. No fibrillations or positive waves were seen. The medial gastrocnemius muscle reveals 1 to 3K motor units with full recruitment. No fibrillations or positive waves were seen. The Abductor hallucis muscle reveals 1 to 3 K units with full recruitment. No fibrillations or positive waves were seen. The vastus lateralis muscle reveals 2 to 4K motor units with full recruitment. No fibrillations or positive waves were seen. The iliopsoas muscle reveals 2 to 4K motor units with full recruitment. No fibrillations or positive waves were seen. The biceps femoris muscle (long head) reveals 2 to 4K motor units with full recruitment. No fibrillations or positive waves were seen. The lumbosacral paraspinal muscles were tested at 3 levels, and revealed no abnormalities of insertional activity at all 3 levels tested.  There was good relaxation.  EMG study was performed on the right lower extremity:  The tibialis anterior muscle reveals 2 to 4K motor units with full recruitment. No fibrillations or positive waves were seen. The peroneus tertius muscle reveals 2 to 4K motor units with full recruitment. No fibrillations or positive waves were seen. The medial gastrocnemius muscle reveals 1 to 3K motor units with full recruitment. No fibrillations or positive waves were seen. The Abductor hallucis muscle reveals 1 to 3 K units with full recruitment. No fibrillations or positive waves were seen. The vastus lateralis muscle reveals 2 to 4K motor units with full recruitment. No fibrillations or positive waves were seen. The iliopsoas muscle reveals 2 to 4K motor units with full recruitment. No fibrillations or positive waves were seen. The biceps femoris muscle (long head) reveals 2 to 4K motor units with full recruitment. No fibrillations or positive waves were seen. The lumbosacral paraspinal muscles were tested at 3 levels, and revealed no abnormalities of insertional activity at all 3 levels tested. There was good relaxation.   IMPRESSION:  Nerve conduction studies done on both lower extremities were unremarkable, without evidence of a peripheral neuropathy. There is no evidence of tarsal tunnel syndrome on either side. EMG evaluation of both lower extremities were unremarkable, without evidence of an overlying lumbosacral radiculopathy.  Marlan Palau MD 06/08/2013 4:40 PM  [facility] [address] [place], [place] [address]  Phone [phone] Fax [phone]

## 2013-06-27 ENCOUNTER — Other Ambulatory Visit: Payer: Self-pay | Admitting: *Deleted

## 2013-06-27 MED ORDER — TRAMADOL HCL 50 MG PO TABS: 50.0000 mg | ORAL_TABLET | Freq: Four times a day (QID) | ORAL | Status: DC | PRN

## 2013-06-27 NOTE — Telephone Encounter (Signed)
done

## 2013-06-27 NOTE — Telephone Encounter (Signed)
Last filled 03/22/2013

## 2013-06-27 NOTE — Telephone Encounter (Signed)
Please refill times 3 

## 2013-07-06 ENCOUNTER — Emergency Department: Payer: Self-pay | Admitting: Emergency Medicine

## 2013-08-04 ENCOUNTER — Other Ambulatory Visit: Payer: Self-pay | Admitting: *Deleted

## 2013-08-04 MED ORDER — AMLODIPINE BESYLATE 5 MG PO TABS: ORAL_TABLET | ORAL | Status: DC

## 2013-08-29 ENCOUNTER — Telehealth: Payer: Self-pay

## 2013-08-29 NOTE — Telephone Encounter (Signed)
Jodi from Dr. Geryl Rankins office called for results of recent EMG. I faxed that over to them at [phone]. I called her back to notify her that docuemnts were faxed and confirmed at this end.

## 2013-10-31 ENCOUNTER — Other Ambulatory Visit: Payer: Self-pay | Admitting: Family Medicine

## 2013-11-01 NOTE — Telephone Encounter (Signed)
Electronic refill request, please advise  

## 2013-11-01 NOTE — Telephone Encounter (Signed)
Called to [facility] 

## 2013-11-01 NOTE — Telephone Encounter (Signed)
Px written for call in   

## 2013-11-09 ENCOUNTER — Other Ambulatory Visit: Payer: Self-pay

## 2013-11-09 DIAGNOSIS — Z1231 Encounter for screening mammogram for malignant neoplasm of breast: Secondary | ICD-10-CM

## 2013-11-27 ENCOUNTER — Encounter: Payer: Self-pay | Admitting: Obstetrics & Gynecology

## 2013-11-27 ENCOUNTER — Ambulatory Visit (INDEPENDENT_AMBULATORY_CARE_PROVIDER_SITE_OTHER): Payer: 59 | Admitting: Obstetrics & Gynecology

## 2013-11-27 VITALS — BP 130/90 | HR 87 | Ht 63.0 in | Wt 163.0 lb

## 2013-11-27 DIAGNOSIS — Z23 Encounter for immunization: Secondary | ICD-10-CM

## 2013-11-27 DIAGNOSIS — Z01419 Encounter for gynecological examination (general) (routine) without abnormal findings: Secondary | ICD-10-CM

## 2013-11-27 DIAGNOSIS — N951 Menopausal and female climacteric states: Secondary | ICD-10-CM

## 2013-11-27 DIAGNOSIS — R6882 Decreased libido: Secondary | ICD-10-CM

## 2013-11-27 MED ORDER — TESTOSTERONE 10 MG/ACT (2%) TD GEL: TRANSDERMAL | Status: DC

## 2013-11-27 MED ORDER — ESTRADIOL 1 MG PO TABS: 1.0000 mg | ORAL_TABLET | Freq: Every day | ORAL | Status: DC

## 2013-11-27 NOTE — Patient Instructions (Signed)
Preventive Care for Adults, Female A healthy lifestyle and preventive care can promote health and wellness. Preventive health guidelines for women include the following key practices.  A routine yearly physical is a good way to check with your caregiver about your health and preventive screening. It is a chance to share any concerns and updates on your health, and to receive a thorough exam.  Visit your dentist for a routine exam and preventive care every 6 months. Brush your teeth twice a day and floss once a day. Good oral hygiene prevents tooth decay and gum disease.  The frequency of eye exams is based on your age, health, family medical history, use of contact lenses, and other factors. Follow your caregiver's recommendations for frequency of eye exams.  Eat a healthy diet. Foods like vegetables, fruits, whole grains, low-fat dairy products, and lean protein foods contain the nutrients you need without too many calories. Decrease your intake of foods high in solid fats, added sugars, and salt. Eat the right amount of calories for you.Get information about a proper diet from your caregiver, if necessary.  Regular physical exercise is one of the most important things you can do for your health. Most adults should get at least 150 minutes of moderate-intensity exercise (any activity that increases your heart rate and causes you to sweat) each week. In addition, most adults need muscle-strengthening exercises on 2 or more days a week.  Maintain a healthy weight. The body mass index (BMI) is a screening tool to identify possible weight problems. It provides an estimate of body fat based on height and weight. Your caregiver can help determine your BMI, and can help you achieve or maintain a healthy weight.For adults 20 years and older:  A BMI below 18.5 is considered underweight.  A BMI of 18.5 to 24.9 is normal.  A BMI of 25 to 29.9 is considered overweight.  A BMI of 30 and above is  considered obese.  Maintain normal blood lipids and cholesterol levels by exercising and minimizing your intake of saturated fat. Eat a balanced diet with plenty of fruit and vegetables. Blood tests for lipids and cholesterol should begin at age 54 and be repeated every 5 years. If your lipid or cholesterol levels are high, you are over 50, or you are at high risk for heart disease, you may need your cholesterol levels checked more frequently.Ongoing high lipid and cholesterol levels should be treated with medicines if diet and exercise are not effective.  If you smoke, find out from your caregiver how to quit. If you do not use tobacco, do not start.  Lung cancer screening is recommended for adults aged 28 80 years who are at high risk for developing lung cancer because of a history of smoking. Yearly low-dose computed tomography (CT) is recommended for people who have at least a 30-pack-year history of smoking and are a current smoker or have quit within the past 15 years. A pack year of smoking is smoking an average of 1 pack of cigarettes a day for 1 year (for example: 1 pack a day for 30 years or 2 packs a day for 15 years). Yearly screening should continue until the smoker has stopped smoking for at least 15 years. Yearly screening should also be stopped for people who develop a health problem that would prevent them from having lung cancer treatment.  If you are pregnant, do not drink alcohol. If you are breastfeeding, be very cautious about drinking alcohol. If you are  not pregnant and choose to drink alcohol, do not exceed 1 drink per day. One drink is considered to be 12 ounces (355 mL) of beer, 5 ounces (148 mL) of wine, or 1.5 ounces (44 mL) of liquor.  Avoid use of street drugs. Do not share needles with anyone. Ask for help if you need support or instructions about stopping the use of drugs.  High blood pressure causes heart disease and increases the risk of stroke. Your blood pressure  should be checked at least every 1 to 2 years. Ongoing high blood pressure should be treated with medicines if weight loss and exercise are not effective.  If you are 55 to 54 years old, ask your caregiver if you should take aspirin to prevent strokes.  Diabetes screening involves taking a blood sample to check your fasting blood sugar level. This should be done once every 3 years, after age 45, if you are within normal weight and without risk factors for diabetes. Testing should be considered at a younger age or be carried out more frequently if you are overweight and have at least 1 risk factor for diabetes.  Breast cancer screening is essential preventive care for women. You should practice "breast self-awareness." This means understanding the normal appearance and feel of your breasts and may include breast self-examination. Any changes detected, no matter how small, should be reported to a caregiver. Women in their 20s and 30s should have a clinical breast exam (CBE) by a caregiver as part of a regular health exam every 1 to 3 years. After age 40, women should have a CBE every year. Starting at age 40, women should consider having a mammography (breast X-ray test) every year. Women who have a family history of breast cancer should talk to their caregiver about genetic screening. Women at a high risk of breast cancer should talk to their caregivers about having magnetic resonance imaging (MRI) and a mammography every year.  Breast cancer gene (BRCA)-related cancer risk assessment is recommended for women who have family members with BRCA-related cancers. BRCA-related cancers include breast, ovarian, tubal, and peritoneal cancers. Having family members with these cancers may be associated with an increased risk for harmful changes (mutations) in the breast cancer genes BRCA1 and BRCA2. Results of the assessment will determine the need for genetic counseling and BRCA1 and BRCA2 testing.  The Pap test is  a screening test for cervical cancer. A Pap test can show cell changes on the cervix that might become cervical cancer if left untreated. A Pap test is a procedure in which cells are obtained and examined from the lower end of the uterus (cervix).  Women should have a Pap test starting at age 21.  Between ages 21 and 29, Pap tests should be repeated every 2 years.  Beginning at age 30, you should have a Pap test every 3 years as long as the past 3 Pap tests have been normal.  Some women have medical problems that increase the chance of getting cervical cancer. Talk to your caregiver about these problems. It is especially important to talk to your caregiver if a new problem develops soon after your last Pap test. In these cases, your caregiver may recommend more frequent screening and Pap tests.  The above recommendations are the same for women who have or have not gotten the vaccine for human papillomavirus (HPV).  If you had a hysterectomy for a problem that was not cancer or a condition that could lead to cancer, then   you no longer need Pap tests. Even if you no longer need a Pap test, a regular exam is a good idea to make sure no other problems are starting.  If you are between ages 79 and 19, and you have had normal Pap tests going back 10 years, you no longer need Pap tests. Even if you no longer need a Pap test, a regular exam is a good idea to make sure no other problems are starting.  If you have had past treatment for cervical cancer or a condition that could lead to cancer, you need Pap tests and screening for cancer for at least 20 years after your treatment.  If Pap tests have been discontinued, risk factors (such as a new sexual partner) need to be reassessed to determine if screening should be resumed.  The HPV test is an additional test that may be used for cervical cancer screening. The HPV test looks for the virus that can cause the cell changes on the cervix. The cells collected  during the Pap test can be tested for HPV. The HPV test could be used to screen women aged 90 years and older, and should be used in women of any age who have unclear Pap test results. After the age of 9, women should have HPV testing at the same frequency as a Pap test.  Colorectal cancer can be detected and often prevented. Most routine colorectal cancer screening begins at the age of 63 and continues through age 61. However, your caregiver may recommend screening at an earlier age if you have risk factors for colon cancer. On a yearly basis, your caregiver may provide home test kits to check for hidden blood in the stool. Use of a small camera at the end of a tube, to directly examine the colon (sigmoidoscopy or colonoscopy), can detect the earliest forms of colorectal cancer. Talk to your caregiver about this at age 32, when routine screening begins. Direct examination of the colon should be repeated every 5 to 10 years through age 7, unless early forms of pre-cancerous polyps or small growths are found.  Hepatitis C blood testing is recommended for all people born from [birth year] through 1965 and any individual with known risks for hepatitis C.  Practice safe sex. Use condoms and avoid high-risk sexual practices to reduce the spread of sexually transmitted infections (STIs). STIs include gonorrhea, chlamydia, syphilis, trichomonas, herpes, HPV, and human immunodeficiency virus (HIV). Herpes, HIV, and HPV are viral illnesses that have no cure. They can result in disability, cancer, and death. Sexually active women aged 7 and younger should be checked for chlamydia. Older women with new or multiple partners should also be tested for chlamydia. Testing for other STIs is recommended if you are sexually active and at increased risk.  Osteoporosis is a disease in which the bones lose minerals and strength with aging. This can result in serious bone fractures. The risk of osteoporosis can be identified using a  bone density scan. Women ages 30 and over and women at risk for fractures or osteoporosis should discuss screening with their caregivers. Ask your caregiver whether you should take a calcium supplement or vitamin D to reduce the rate of osteoporosis.  Menopause can be associated with physical symptoms and risks. Hormone replacement therapy is available to decrease symptoms and risks. You should talk to your caregiver about whether hormone replacement therapy is right for you.  Use sunscreen. Apply sunscreen liberally and repeatedly throughout the day. You should seek shade  when your shadow is shorter than you. Protect yourself by wearing long sleeves, pants, a wide-brimmed hat, and sunglasses year round, whenever you are outdoors.  Once a month, do a whole body skin exam, using a mirror to look at the skin on your back. Notify your caregiver of new moles, moles that have irregular borders, moles that are larger than a pencil eraser, or moles that have changed in shape or color.  Stay current with required immunizations.  Influenza vaccine. All adults should be immunized every year.  Tetanus, diphtheria, and acellular pertussis (Td, Tdap) vaccine. Pregnant women should receive 1 dose of Tdap vaccine during each pregnancy. The dose should be obtained regardless of the length of time since the last dose. Immunization is preferred during the 27th to 36th week of gestation. An adult who has not previously received Tdap or who does not know her vaccine status should receive 1 dose of Tdap. This initial dose should be followed by tetanus and diphtheria toxoids (Td) booster doses every 10 years. Adults with an unknown or incomplete history of completing a 3-dose immunization series with Td-containing vaccines should begin or complete a primary immunization series including a Tdap dose. Adults should receive a Td booster every 10 years.  Varicella vaccine. An adult without evidence of immunity to varicella  should receive 2 doses or a second dose if she has previously received 1 dose. Pregnant females who do not have evidence of immunity should receive the first dose after pregnancy. This first dose should be obtained before leaving the health care facility. The second dose should be obtained 4 8 weeks after the first dose.  Human papillomavirus (HPV) vaccine. Females aged 13 26 years who have not received the vaccine previously should obtain the 3-dose series. The vaccine is not recommended for use in pregnant females. However, pregnancy testing is not needed before receiving a dose. If a female is found to be pregnant after receiving a dose, no treatment is needed. In that case, the remaining doses should be delayed until after the pregnancy. Immunization is recommended for any person with an immunocompromised condition through the age of 26 years if she did not get any or all doses earlier. During the 3-dose series, the second dose should be obtained 4 8 weeks after the first dose. The third dose should be obtained 24 weeks after the first dose and 16 weeks after the second dose.  Zoster vaccine. One dose is recommended for adults aged 60 years or older unless certain conditions are present.  Measles, mumps, and rubella (MMR) vaccine. Adults born before 1957 generally are considered immune to measles and mumps. Adults born in 1957 or later should have 1 or more doses of MMR vaccine unless there is a contraindication to the vaccine or there is laboratory evidence of immunity to each of the three diseases. A routine second dose of MMR vaccine should be obtained at least 28 days after the first dose for students attending postsecondary schools, health care workers, or international travelers. People who received inactivated measles vaccine or an unknown type of measles vaccine during 1963 1967 should receive 2 doses of MMR vaccine. People who received inactivated mumps vaccine or an unknown type of mumps vaccine  before 1979 and are at high risk for mumps infection should consider immunization with 2 doses of MMR vaccine. For females of childbearing age, rubella immunity should be determined. If there is no evidence of immunity, females who are not pregnant should be vaccinated. If there   is no evidence of immunity, females who are pregnant should delay immunization until after pregnancy. Unvaccinated health care workers born before 1957 who lack laboratory evidence of measles, mumps, or rubella immunity or laboratory confirmation of disease should consider measles and mumps immunization with 2 doses of MMR vaccine or rubella immunization with 1 dose of MMR vaccine.  Pneumococcal 13-valent conjugate (PCV13) vaccine. When indicated, a person who is uncertain of her immunization history and has no record of immunization should receive the PCV13 vaccine. An adult aged 19 years or older who has certain medical conditions and has not been previously immunized should receive 1 dose of PCV13 vaccine. This PCV13 should be followed with a dose of pneumococcal polysaccharide (PPSV23) vaccine. The PPSV23 vaccine dose should be obtained at least 8 weeks after the dose of PCV13 vaccine. An adult aged 19 years or older who has certain medical conditions and previously received 1 or more doses of PPSV23 vaccine should receive 1 dose of PCV13. The PCV13 vaccine dose should be obtained 1 or more years after the last PPSV23 vaccine dose.  Pneumococcal polysaccharide (PPSV23) vaccine. When PCV13 is also indicated, PCV13 should be obtained first. All adults aged 65 years and older should be immunized. An adult younger than age 65 years who has certain medical conditions should be immunized. Any person who resides in a nursing home or long-term care facility should be immunized. An adult smoker should be immunized. People with an immunocompromised condition and certain other conditions should receive both PCV13 and PPSV23 vaccines. People  with human immunodeficiency virus (HIV) infection should be immunized as soon as possible after diagnosis. Immunization during chemotherapy or radiation therapy should be avoided. Routine use of PPSV23 vaccine is not recommended for American Indians, [place] Natives, or people younger than 65 years unless there are medical conditions that require PPSV23 vaccine. When indicated, people who have unknown immunization and have no record of immunization should receive PPSV23 vaccine. One-time revaccination 5 years after the first dose of PPSV23 is recommended for people aged 19 64 years who have chronic kidney failure, nephrotic syndrome, asplenia, or immunocompromised conditions. People who received 1 2 doses of PPSV23 before age 65 years should receive another dose of PPSV23 vaccine at age 65 years or later if at least 5 years have passed since the previous dose. Doses of PPSV23 are not needed for people immunized with PPSV23 at or after age 65 years.  Meningococcal vaccine. Adults with asplenia or persistent complement component deficiencies should receive 2 doses of quadrivalent meningococcal conjugate (MenACWY-D) vaccine. The doses should be obtained at least 2 months apart. Microbiologists working with certain meningococcal bacteria, military recruits, people at risk during an outbreak, and people who travel to or live in countries with a high rate of meningitis should be immunized. A first-year college student up through age 21 years who is living in a residence hall should receive a dose if she did not receive a dose on or after her 16th birthday. Adults who have certain high-risk conditions should receive one or more doses of vaccine.  Hepatitis A vaccine. Adults who wish to be protected from this disease, have certain high-risk conditions, work with hepatitis A-infected animals, work in hepatitis A research labs, or travel to or work in countries with a high rate of hepatitis A should be immunized. Adults  who were previously unvaccinated and who anticipate close contact with an international adoptee during the first 60 days after arrival in the [place] from a country   with a high rate of hepatitis A should be immunized.  Hepatitis B vaccine. Adults who wish to be protected from this disease, have certain high-risk conditions, may be exposed to blood or other infectious body fluids, are household contacts or sex partners of hepatitis B positive people, are clients or workers in certain care facilities, or travel to or work in countries with a high rate of hepatitis B should be immunized.  Haemophilus influenzae type b (Hib) vaccine. A previously unvaccinated person with asplenia or sickle cell disease or having a scheduled splenectomy should receive 1 dose of Hib vaccine. Regardless of previous immunization, a recipient of a hematopoietic stem cell transplant should receive a 3-dose series 6 12 months after her successful transplant. Hib vaccine is not recommended for adults with HIV infection. Preventive Services / Frequency Ages 19 to 39  Blood pressure check.** / Every 1 to 2 years.  Lipid and cholesterol check.** / Every 5 years beginning at age 20.  Clinical breast exam.** / Every 3 years for women in their 20s and 30s.  BRCA-related cancer risk assessment.** / For women who have family members with a BRCA-related cancer (breast, ovarian, tubal, or peritoneal cancers).  Pap test.** / Every 2 years from ages 21 through 29. Every 3 years starting at age 30 through age 65 or 70 with a history of 3 consecutive normal Pap tests.  HPV screening.** / Every 3 years from ages 30 through ages 65 to 70 with a history of 3 consecutive normal Pap tests.  Hepatitis C blood test.** / For any individual with known risks for hepatitis C.  Skin self-exam. / Monthly.  Influenza vaccine. / Every year.  Tetanus, diphtheria, and acellular pertussis (Tdap, Td) vaccine.** / Consult your caregiver. Pregnant  women should receive 1 dose of Tdap vaccine during each pregnancy. 1 dose of Td every 10 years.  Varicella vaccine.** / Consult your caregiver. Pregnant females who do not have evidence of immunity should receive the first dose after pregnancy.  HPV vaccine. / 3 doses over 6 months, if 26 and younger. The vaccine is not recommended for use in pregnant females. However, pregnancy testing is not needed before receiving a dose.  Measles, mumps, rubella (MMR) vaccine.** / You need at least 1 dose of MMR if you were born in 1957 or later. You may also need a 2nd dose. For females of childbearing age, rubella immunity should be determined. If there is no evidence of immunity, females who are not pregnant should be vaccinated. If there is no evidence of immunity, females who are pregnant should delay immunization until after pregnancy.  Pneumococcal 13-valent conjugate (PCV13) vaccine.** / Consult your caregiver.  Pneumococcal polysaccharide (PPSV23) vaccine.** / 1 to 2 doses if you smoke cigarettes or if you have certain conditions.  Meningococcal vaccine.** / 1 dose if you are age 19 to 21 years and a first-year college student living in a residence hall, or have one of several medical conditions, you need to get vaccinated against meningococcal disease. You may also need additional booster doses.  Hepatitis A vaccine.** / Consult your caregiver.  Hepatitis B vaccine.** / Consult your caregiver.  Haemophilus influenzae type b (Hib) vaccine.** / Consult your caregiver. Ages 40 to 64  Blood pressure check.** / Every 1 to 2 years.  Lipid and cholesterol check.** / Every 5 years beginning at age 20.  Lung cancer screening. / Every year if you are aged 55 80 years and have a 30-pack-year history of smoking and   currently smoke or have quit within the past 15 years. Yearly screening is stopped once you have quit smoking for at least 15 years or develop a health problem that would prevent you from having  lung cancer treatment.  Clinical breast exam.** / Every year after age 40.  BRCA-related cancer risk assessment.** / For women who have family members with a BRCA-related cancer (breast, ovarian, tubal, or peritoneal cancers).  Mammogram.** / Every year beginning at age 40 and continuing for as long as you are in good health. Consult with your caregiver.  Pap test.** / Every 3 years starting at age 30 through age 65 or 70 with a history of 3 consecutive normal Pap tests.  HPV screening.** / Every 3 years from ages 30 through ages 65 to 70 with a history of 3 consecutive normal Pap tests.  Fecal occult blood test (FOBT) of stool. / Every year beginning at age 50 and continuing until age 75. You may not need to do this test if you get a colonoscopy every 10 years.  Flexible sigmoidoscopy or colonoscopy.** / Every 5 years for a flexible sigmoidoscopy or every 10 years for a colonoscopy beginning at age 50 and continuing until age 75.  Hepatitis C blood test.** / For all people born from 1945 through 1965 and any individual with known risks for hepatitis C.  Skin self-exam. / Monthly.  Influenza vaccine. / Every year.  Tetanus, diphtheria, and acellular pertussis (Tdap/Td) vaccine.** / Consult your caregiver. Pregnant women should receive 1 dose of Tdap vaccine during each pregnancy. 1 dose of Td every 10 years.  Varicella vaccine.** / Consult your caregiver. Pregnant females who do not have evidence of immunity should receive the first dose after pregnancy.  Zoster vaccine.** / 1 dose for adults aged 60 years or older.  Measles, mumps, rubella (MMR) vaccine.** / You need at least 1 dose of MMR if you were born in 1957 or later. You may also need a 2nd dose. For females of childbearing age, rubella immunity should be determined. If there is no evidence of immunity, females who are not pregnant should be vaccinated. If there is no evidence of immunity, females who are pregnant should delay  immunization until after pregnancy.  Pneumococcal 13-valent conjugate (PCV13) vaccine.** / Consult your caregiver.  Pneumococcal polysaccharide (PPSV23) vaccine.** / 1 to 2 doses if you smoke cigarettes or if you have certain conditions.  Meningococcal vaccine.** / Consult your caregiver.  Hepatitis A vaccine.** / Consult your caregiver.  Hepatitis B vaccine.** / Consult your caregiver.  Haemophilus influenzae type b (Hib) vaccine.** / Consult your caregiver. Ages 65 and over  Blood pressure check.** / Every 1 to 2 years.  Lipid and cholesterol check.** / Every 5 years beginning at age 20.  Lung cancer screening. / Every year if you are aged 55 80 years and have a 30-pack-year history of smoking and currently smoke or have quit within the past 15 years. Yearly screening is stopped once you have quit smoking for at least 15 years or develop a health problem that would prevent you from having lung cancer treatment.  Clinical breast exam.** / Every year after age 40.  BRCA-related cancer risk assessment.** / For women who have family members with a BRCA-related cancer (breast, ovarian, tubal, or peritoneal cancers).  Mammogram.** / Every year beginning at age 40 and continuing for as long as you are in good health. Consult with your caregiver.  Pap test.** / Every 3 years starting at age   30 through age 48 or 62 with a 3 consecutive normal Pap tests. Testing can be stopped between 65 and 70 with 3 consecutive normal Pap tests and no abnormal Pap or HPV tests in the past 10 years.  HPV screening.** / Every 3 years from ages 84 through ages 64 or 18 with a history of 3 consecutive normal Pap tests. Testing can be stopped between 65 and 70 with 3 consecutive normal Pap tests and no abnormal Pap or HPV tests in the past 10 years.  Fecal occult blood test (FOBT) of stool. / Every year beginning at age 66 and continuing until age 43. You may not need to do this test if you get a colonoscopy  every 10 years.  Flexible sigmoidoscopy or colonoscopy.** / Every 5 years for a flexible sigmoidoscopy or every 10 years for a colonoscopy beginning at age 1 and continuing until age 63.  Hepatitis C blood test.** / For all people born from [birth year] through 1967 and any individual with known risks for hepatitis C.  Osteoporosis screening.** / A one-time screening for women ages 82 and over and women at risk for fractures or osteoporosis.  Skin self-exam. / Monthly.  Influenza vaccine. / Every year.  Tetanus, diphtheria, and acellular pertussis (Tdap/Td) vaccine.** / 1 dose of Td every 10 years.  Varicella vaccine.** / Consult your caregiver.  Zoster vaccine.** / 1 dose for adults aged 22 years or older.  Pneumococcal 13-valent conjugate (PCV13) vaccine.** / Consult your caregiver.  Pneumococcal polysaccharide (PPSV23) vaccine.** / 1 dose for all adults aged 45 years and older.  Meningococcal vaccine.** / Consult your caregiver.  Hepatitis A vaccine.** / Consult your caregiver.  Hepatitis B vaccine.** / Consult your caregiver.  Haemophilus influenzae type b (Hib) vaccine.** / Consult your caregiver. ** Family history and personal history of risk and conditions may change your caregiver's recommendations. Document Released: 01/19/2002 Document Revised: 03/20/2013 Document Reviewed: 04/20/2011 [facility] Patient Information 2014 [place], [place].  Thank you for enrolling in MyChart. Please follow the instructions below to securely access your online medical record. MyChart allows you to send messages to your doctor, view your test results, manage appointments, and more.   How Do I Sign Up? 1. In your Internet browser, go to [employer] and enter [email or web address]. 2. Click on the Sign Up Now link in the Sign In box. You will see the New Member Sign Up page. 3. Enter your MyChart Access Code exactly as it appears below. You will not need to use this code after you've  completed the sign-up process. If you do not sign up before the expiration date, you must request a new code.  MyChart Access Code: [email or web address] Expires: 01/26/2014  3:21 PM  4. Enter your Social Security Number ([id number]) and Date of Birth (mm/dd/yyyy) as indicated and click Submit. You will be taken to the next sign-up page. 5. Create a MyChart ID. This will be your MyChart login ID and cannot be changed, so think of one that is secure and easy to remember. 6. Create a MyChart password. You can change your password at any time. 7. Enter your Password Reset Question and Answer. This can be used at a later time if you forget your password.  8. Enter your e-mail address. You will receive e-mail notification when new information is available in MyChart. 9. Click Sign Up. You can now view your medical record.   Additional Information Remember, MyChart is NOT to be used for urgent needs. For medical emergencies,  dial 911.

## 2013-11-27 NOTE — Progress Notes (Signed)
    Subjective:    Mindy Long is a 52 y.o. G3P3 PMP female who presents for an annual exam. She is s/p TAH/LSO in 2003 at [facility] for fibroids. The patient is sexually active, but reports low libido. She is interested in testosterone therapy.  GYN screening history: no prior history of gyn screening tests, normal mammograms, scheduled for one 12/2013. No other GYN concerns.  Menstrual History: OB History   Grav Para Term Preterm Abortions TAB SAB Ect Mult Living   3 3 3       3     NSVD x 3 No LMP recorded. Patient has had a hysterectomy.   The following portions of the patient's history were reviewed and updated as appropriate: allergies, current medications, past family history, past medical history, past social history, past surgical history and problem list.  Review of Systems Pertinent items are noted in HPI.    Objective:   BP 130/90  Pulse 87  Ht 5\' 3"  (1.6 m)  Wt 163 lb (73.936 kg)  BMI 28.88 kg/m2 GENERAL: Well-developed, well-nourished female in no acute distress.  HEENT: Normocephalic, atraumatic. Sclerae anicteric.  NECK: Supple. Normal thyroid.  LUNGS: Clear to auscultation bilaterally.  HEART: Regular rate and rhythm. BREASTS: Symmetric in size. No masses, skin changes, nipple drainage, or lymphadenopathy. ABDOMEN: Soft, nontender, nondistended. No organomegaly. PELVIC: Normal external female genitalia. Vagina is pink and rugated.  Normal discharge. Normal vaginal cuff. EXTREMITIES: No cyanosis, clubbing, or edema, 2+ distal pulses.   Assessment:   1. Routine gynecological examination   2. Menopausal symptoms   3. Need for immunization against influenza   4. Decreased libido      Plan:   For low libido, Testosterone 2% cream prescribed, will reevaluate in one month. For vasomotor symptoms, Estradiol 1 mg po daily prescribed as per patient's preference Breast self exam technique reviewed and patient encouraged to perform self-exam monthly. Discussed  healthy lifestyle modifications. Follow up as needed.  Flu shot given, routine preventative health maintenance measures emphasized.   Jaynie Collins, MD, FACOG Attending Obstetrician & Gynecologist Faculty Practice, [facility]

## 2013-12-04 ENCOUNTER — Other Ambulatory Visit: Payer: Self-pay | Admitting: Family Medicine

## 2013-12-05 NOTE — Telephone Encounter (Signed)
Electronic refill request, no recent/future appt. please advise  

## 2013-12-05 NOTE — Telephone Encounter (Signed)
Please schedule PE for April or may and refill until then, thanks

## 2013-12-08 NOTE — Telephone Encounter (Signed)
Rx declined due to pt not needing it but CPE scheduled

## 2013-12-14 ENCOUNTER — Ambulatory Visit: Payer: 59

## 2013-12-29 ENCOUNTER — Ambulatory Visit: Payer: 59

## 2014-01-12 ENCOUNTER — Ambulatory Visit
Admission: RE | Admit: 2014-01-12 | Discharge: 2014-01-12 | Disposition: A | Discharge status: Home / Self Care | Payer: 59 | Source: Ambulatory Visit

## 2014-01-12 DIAGNOSIS — Z1231 Encounter for screening mammogram for malignant neoplasm of breast: Secondary | ICD-10-CM

## 2014-01-16 ENCOUNTER — Encounter: Payer: Self-pay | Admitting: Family Medicine

## 2014-02-09 ENCOUNTER — Other Ambulatory Visit: Payer: 59

## 2014-02-12 ENCOUNTER — Other Ambulatory Visit: Payer: Self-pay | Admitting: Family Medicine

## 2014-02-12 NOTE — Telephone Encounter (Signed)
Px written for call in   

## 2014-02-12 NOTE — Telephone Encounter (Signed)
Rx called in as prescribed 

## 2014-02-12 NOTE — Telephone Encounter (Signed)
Electronic refill request, please advise  

## 2014-02-16 ENCOUNTER — Other Ambulatory Visit: Payer: 59

## 2014-02-19 ENCOUNTER — Telehealth: Payer: Self-pay | Admitting: Family Medicine

## 2014-02-19 DIAGNOSIS — Z0000 Encounter for general adult medical examination without abnormal findings: Secondary | ICD-10-CM

## 2014-02-19 DIAGNOSIS — E559 Vitamin D deficiency, unspecified: Secondary | ICD-10-CM

## 2014-02-19 NOTE — Telephone Encounter (Signed)
Message copied by Abner Greenspan on Mon Feb 19, 2014  9:54 PM ------      Message from: Ellamae Sia      Created: Wed Feb 14, 2014  3:29 PM      Regarding: Lab orders for Tuesday, 3.17.15       Patient is scheduled for CPX labs, please order future labs, Thanks , Terri       ------

## 2014-02-20 ENCOUNTER — Other Ambulatory Visit (INDEPENDENT_AMBULATORY_CARE_PROVIDER_SITE_OTHER): Payer: 59

## 2014-02-20 DIAGNOSIS — Z0000 Encounter for general adult medical examination without abnormal findings: Secondary | ICD-10-CM

## 2014-02-20 DIAGNOSIS — E559 Vitamin D deficiency, unspecified: Secondary | ICD-10-CM

## 2014-02-20 LAB — LIPID PANEL
Cholesterol: 222 mg/dL — ABNORMAL HIGH (ref 0–200)
HDL: 98.8 mg/dL (ref >39.00)
LDL CALC: 114 mg/dL — AB (ref 0–99)
TRIGLYCERIDES: 47 mg/dL (ref 0.0–149.0)
Total CHOL/HDL Ratio: 2
VLDL: 9.4 mg/dL (ref 0.0–40.0)

## 2014-02-20 LAB — COMPREHENSIVE METABOLIC PANEL
ALBUMIN: 3.6 g/dL (ref 3.5–5.2)
ALT: 26 U/L (ref 0–35)
AST: 24 U/L (ref 0–37)
Alkaline Phosphatase: 96 U/L (ref 39–117)
BUN: 10 mg/dL (ref 6–23)
CO2: 24 mEq/L (ref 19–32)
Calcium: 8.8 mg/dL (ref 8.4–10.5)
Chloride: 109 mEq/L (ref 96–112)
Creatinine, Ser: 0.8 mg/dL (ref 0.4–1.2)
GFR: 97.26 mL/min (ref >60.00)
Glucose, Bld: 91 mg/dL (ref 70–99)
POTASSIUM: 3.6 meq/L (ref 3.5–5.1)
Sodium: 140 mEq/L (ref 135–145)
Total Bilirubin: 0.5 mg/dL (ref 0.3–1.2)
Total Protein: 7.1 g/dL (ref 6.0–8.3)

## 2014-02-20 LAB — CBC WITH DIFFERENTIAL/PLATELET
BASOS PCT: 0.7 % (ref 0.0–3.0)
Basophils Absolute: 0 10*3/uL (ref 0.0–0.1)
EOS ABS: 0.1 10*3/uL (ref 0.0–0.7)
Eosinophils Relative: 2.5 % (ref 0.0–5.0)
HCT: 41 % (ref 36.0–46.0)
Hemoglobin: 13.2 g/dL (ref 12.0–15.0)
LYMPHS PCT: 48.7 % — AB (ref 12.0–46.0)
Lymphs Abs: 2.3 10*3/uL (ref 0.7–4.0)
MCHC: 32.1 g/dL (ref 30.0–36.0)
MCV: 78 fl (ref 78.0–100.0)
Monocytes Absolute: 0.4 10*3/uL (ref 0.1–1.0)
Monocytes Relative: 7.4 % (ref 3.0–12.0)
NEUTROS PCT: 40.7 % — AB (ref 43.0–77.0)
Neutro Abs: 1.9 10*3/uL (ref 1.4–7.7)
PLATELETS: 244 10*3/uL (ref 150.0–400.0)
RDW: 14.3 % (ref 11.5–14.6)
WBC: 4.8 10*3/uL (ref 4.5–10.5)

## 2014-02-20 LAB — TSH: TSH: 1.17 u[IU]/mL (ref 0.35–5.50)

## 2014-02-21 LAB — VITAMIN D 25 HYDROXY (VIT D DEFICIENCY, FRACTURES): Vit D, 25-Hydroxy: 57 ng/mL (ref 30–89)

## 2014-02-23 ENCOUNTER — Encounter: Payer: 59 | Admitting: Family Medicine

## 2014-02-26 ENCOUNTER — Encounter: Payer: 59 | Admitting: Family Medicine

## 2014-02-28 ENCOUNTER — Ambulatory Visit (INDEPENDENT_AMBULATORY_CARE_PROVIDER_SITE_OTHER): Payer: 59 | Admitting: Family Medicine

## 2014-02-28 ENCOUNTER — Encounter: Payer: Self-pay | Admitting: Family Medicine

## 2014-02-28 VITALS — BP 130/78 | HR 100 | Temp 98.1°F | Ht 61.75 in | Wt 169.0 lb

## 2014-02-28 DIAGNOSIS — Z0000 Encounter for general adult medical examination without abnormal findings: Secondary | ICD-10-CM

## 2014-02-28 DIAGNOSIS — I10 Essential (primary) hypertension: Secondary | ICD-10-CM

## 2014-02-28 DIAGNOSIS — E559 Vitamin D deficiency, unspecified: Secondary | ICD-10-CM

## 2014-02-28 MED ORDER — ESOMEPRAZOLE MAGNESIUM 20 MG PO CPDR: 20.0000 mg | DELAYED_RELEASE_CAPSULE | Freq: Every day | ORAL | Status: DC

## 2014-02-28 MED ORDER — AMLODIPINE BESYLATE 5 MG PO TABS: ORAL_TABLET | ORAL | Status: DC

## 2014-02-28 NOTE — Patient Instructions (Signed)
Try to take good care of yourself  Keep exercising - aim for 5 days per week  Incorporate fiber into diet more slowly  Drink lots of water  Labs look ok

## 2014-02-28 NOTE — Progress Notes (Signed)
Subjective:    Patient ID: Mindy Long, female    DOB: [birth year], 53 y.o.   MRN: [id number]  HPI Here for health maintenance exam and to review chronic medical problems    Wt is up 6 lb with bmi of 31 She is trying some new diet changes  Some exercise - mainly walking and using exercise bike and some abd exercises   Pap 09 Hysterectomy No gyn problems at all  She saw her gyn recently and all was ok  Mammogram 2/15 normal Self exam- no lumps or changes (her nipples are sensitive)  On estrogen for hot flashes  Was px testosterone for libido lack  She has not started it   Flu shot 12/14 Td 7/10  colonosc 1/13 nl   D level is 57 -with current supplementation   Mood- has been ok / about the same , she stays motivated   Some ringing in her R ear -that is her only complaint   bp is stable today  No cp or palpitations or headaches or edema  No side effects to medicines  BP Readings from Last 3 Encounters:  02/28/14 130/78  11/27/13 130/90  03/23/13 120/80      Results for orders placed in visit on 02/20/14  CBC WITH DIFFERENTIAL      Result Value Ref Range   WBC 4.8  4.5 - 10.5 K/uL   RBC 5.25 Repeated and verified X2. (*) 3.87 - 5.11 Mil/uL   Hemoglobin 13.2  12.0 - 15.0 g/dL   HCT 41.0  36.0 - 46.0 %   MCV 78.0  78.0 - 100.0 fl   MCHC 32.1  30.0 - 36.0 g/dL   RDW 14.3  11.5 - 14.6 %   Platelets 244.0  150.0 - 400.0 K/uL   Neutrophils Relative % 40.7 (*) 43.0 - 77.0 %   Lymphocytes Relative 48.7 (*) 12.0 - 46.0 %   Monocytes Relative 7.4  3.0 - 12.0 %   Eosinophils Relative 2.5  0.0 - 5.0 %   Basophils Relative 0.7  0.0 - 3.0 %   Neutro Abs 1.9  1.4 - 7.7 K/uL   Lymphs Abs 2.3  0.7 - 4.0 K/uL   Monocytes Absolute 0.4  0.1 - 1.0 K/uL   Eosinophils Absolute 0.1  0.0 - 0.7 K/uL   Basophils Absolute 0.0  0.0 - 0.1 K/uL  COMPREHENSIVE METABOLIC PANEL      Result Value Ref Range   Sodium 140  135 - 145 mEq/L   Potassium 3.6  3.5 - 5.1 mEq/L   Chloride 109  96  - 112 mEq/L   CO2 24  19 - 32 mEq/L   Glucose, Bld 91  70 - 99 mg/dL   BUN 10  6 - 23 mg/dL   Creatinine, Ser 0.8  0.4 - 1.2 mg/dL   Total Bilirubin 0.5  0.3 - 1.2 mg/dL   Alkaline Phosphatase 96  39 - 117 U/L   AST 24  0 - 37 U/L   ALT 26  0 - 35 U/L   Total Protein 7.1  6.0 - 8.3 g/dL   Albumin 3.6  3.5 - 5.2 g/dL   Calcium 8.8  8.4 - 10.5 mg/dL   GFR 97.26  >60.00 mL/min  LIPID PANEL      Result Value Ref Range   Cholesterol 222 (*) 0 - 200 mg/dL   Triglycerides 47.0  0.0 - 149.0 mg/dL   HDL 98.80  >39.00 mg/dL   VLDL 9.4  0.0 - 40.0 mg/dL   LDL Cholesterol 114 (*) 0 - 99 mg/dL   Total CHOL/HDL Ratio 2    TSH      Result Value Ref Range   TSH 1.17  0.35 - 5.50 uIU/mL  VITAMIN D 25 HYDROXY      Result Value Ref Range   Vit D, 25-Hydroxy 57  30 - 89 ng/mL    Very good cholesterol  Review of Systems Review of Systems  Constitutional: Negative for fever, appetite change, fatigue and unexpected weight change.  Eyes: Negative for pain and visual disturbance.  Respiratory: Negative for cough and shortness of breath.   Cardiovascular: Negative for cp or palpitations    Gastrointestinal: Negative for nausea, diarrhea and constipation.  Genitourinary: Negative for urgency and frequency.  Skin: Negative for pallor or rash   Neurological: Negative for weakness, light-headedness, numbness and headaches.  Hematological: Negative for adenopathy. Does not bruise/bleed easily.  Psychiatric/Behavioral: Negative for dysphoric mood. The patient is not nervous/anxious.         Objective:   Physical Exam  Constitutional: She appears well-developed and well-nourished. No distress.  HENT:  Head: Normocephalic and atraumatic.  Right Ear: External ear normal.  Left Ear: External ear normal.  Nose: Nose normal.  Mouth/Throat: Oropharynx is clear and moist.  Eyes: Conjunctivae and EOM are normal. Pupils are equal, round, and reactive to light. Right eye exhibits no discharge. Left eye  exhibits no discharge. No scleral icterus.  Neck: Normal range of motion. Neck supple. No JVD present. Carotid bruit is not present. No thyromegaly present.  Cardiovascular: Normal rate, regular rhythm, normal heart sounds and intact distal pulses.  Exam reveals no gallop.   Pulmonary/Chest: Effort normal and breath sounds normal. No respiratory distress. She has no wheezes. She has no rales.  Abdominal: Soft. Bowel sounds are normal. She exhibits no distension and no mass. There is no tenderness.  Musculoskeletal: She exhibits no edema and no tenderness.  Lymphadenopathy:    She has no cervical adenopathy.  Neurological: She is alert. She has normal reflexes. No cranial nerve deficit. She exhibits normal muscle tone. Coordination normal.  Skin: Skin is warm and dry. No rash noted. No erythema. No pallor.  Psychiatric: She has a normal mood and affect.          Assessment & Plan:

## 2014-02-28 NOTE — Progress Notes (Signed)
Pre visit review using our clinic review tool, if applicable. No additional management support is needed unless otherwise documented below in the visit note. 

## 2014-03-01 ENCOUNTER — Telehealth: Payer: Self-pay | Admitting: Family Medicine

## 2014-03-01 NOTE — Assessment & Plan Note (Signed)
Vitamin D level is therapeutic with current supplementation Disc importance of this to bone and overall health  

## 2014-03-01 NOTE — Assessment & Plan Note (Signed)
bp in fair control at this time  BP Readings from Last 1 Encounters:  02/28/14 130/78   No changes needed Disc lifstyle change with low sodium diet and exercise   Labs reviewed

## 2014-03-01 NOTE — Telephone Encounter (Signed)
Relevant patient education assigned to patient using Emmi. ° °

## 2014-03-01 NOTE — Assessment & Plan Note (Signed)
Reviewed health habits including diet and exercise and skin cancer prevention Reviewed appropriate screening tests for age  Also reviewed health mt list, fam hx and immunization status , as well as social and family history    Labs reviewed in detail

## 2014-03-04 IMAGING — CT CT ABD-PELV W/ CM
2 of 6 series · 16 of 46 positions shown, 18 images · IV contrast (APPLIED)
Comparison: None.

CLINICAL DATA: Intermittent abdominal pain.  Hypertension.  History
of reflux and ulcer.  Prior cholecystectomy, hysterectomy and
oophorectomy.

CT ABDOMEN AND PELVIS WITH CONTRAST
TECHNIQUE: Multidetector CT imaging of the abdomen and pelvis was
performed following the standard protocol during bolus
administration of intravenous contrast.
Contrast: 80mL OMNIPAQUE IOHEXOL 300 MG/ML  SOLN

[Series 2: abd/pelv with 5.0 b31f st · axial · 0.76mm/px · z∈[-362,+28]mm · 13 of 88 slices shown, 15 images]
[im 5/88  soft-tissue]
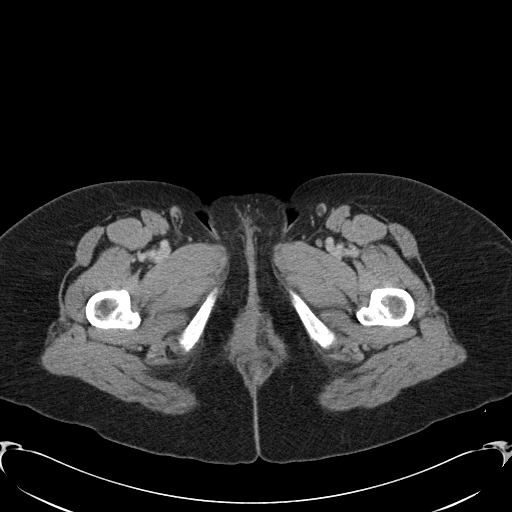
[im 5/88  bone]
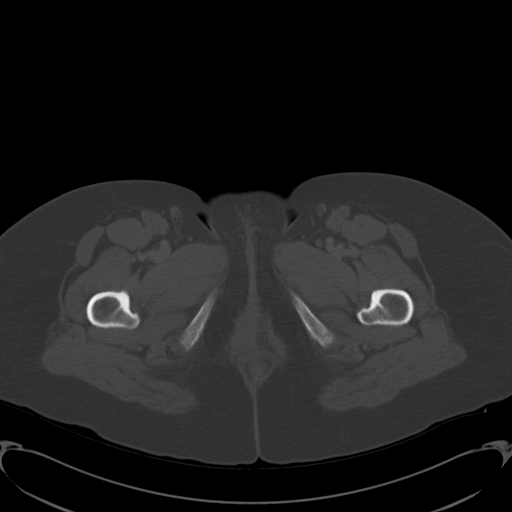
[im 14/88  soft-tissue]
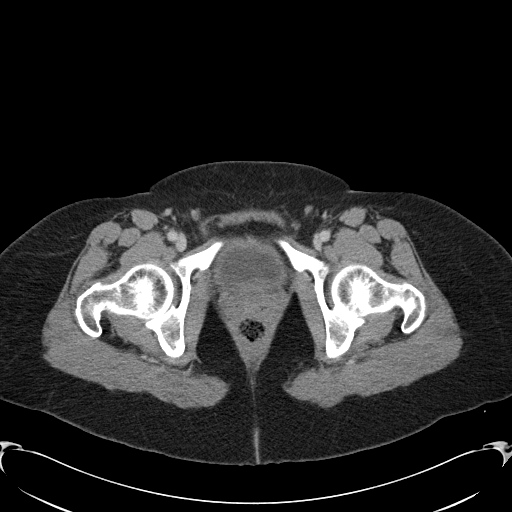
[im 19/88  soft-tissue]
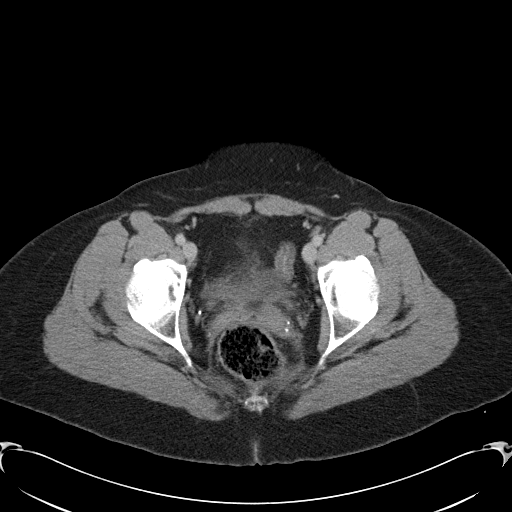
[im 23/88  soft-tissue]
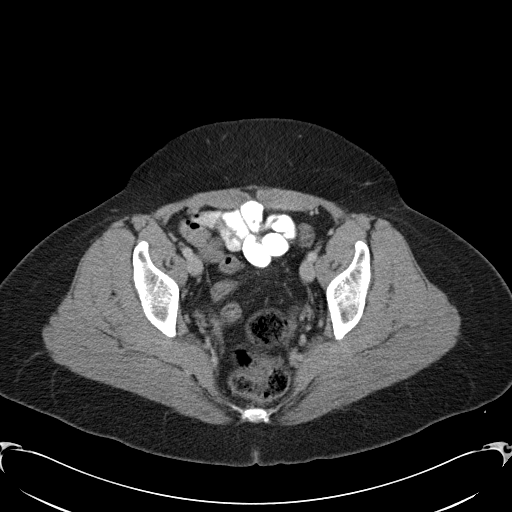
[im 33/88  soft-tissue]
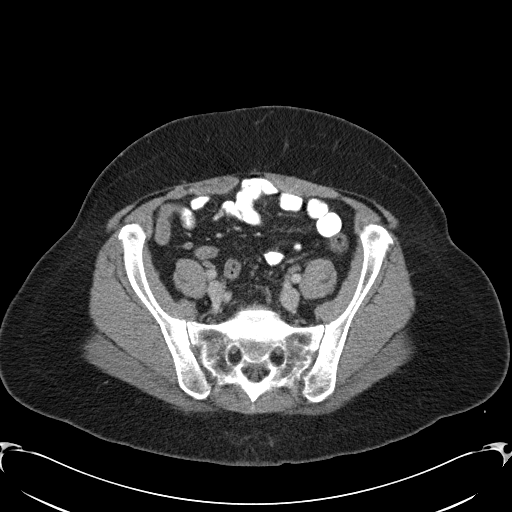
[im 37/88  soft-tissue]
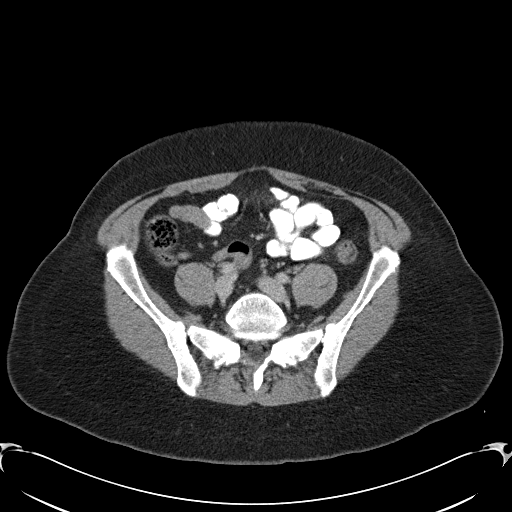
[im 46/88  soft-tissue]
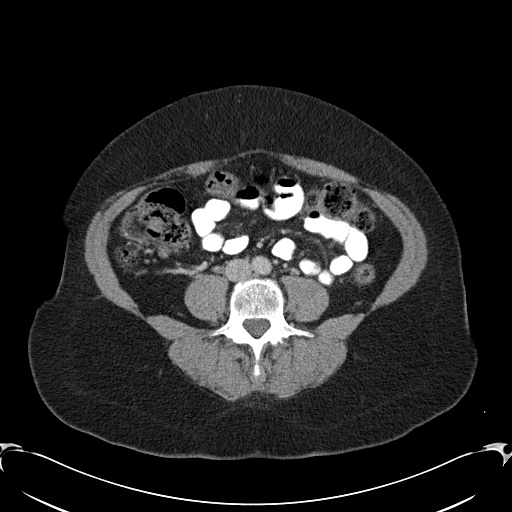
[im 51/88  soft-tissue]
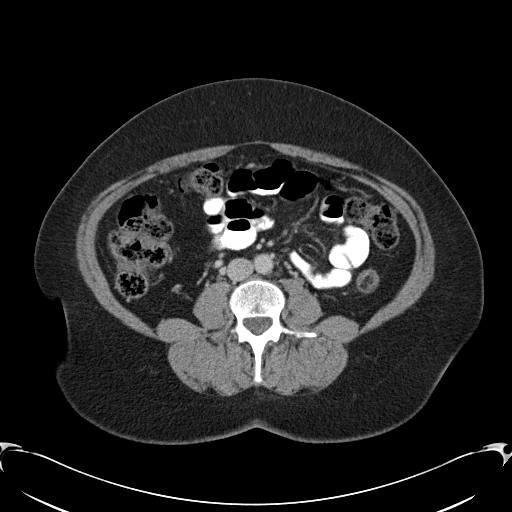
[im 55/88  soft-tissue]
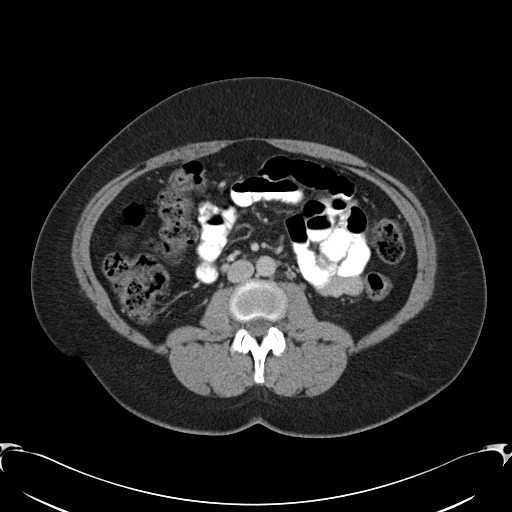
[im 55/88  bone]
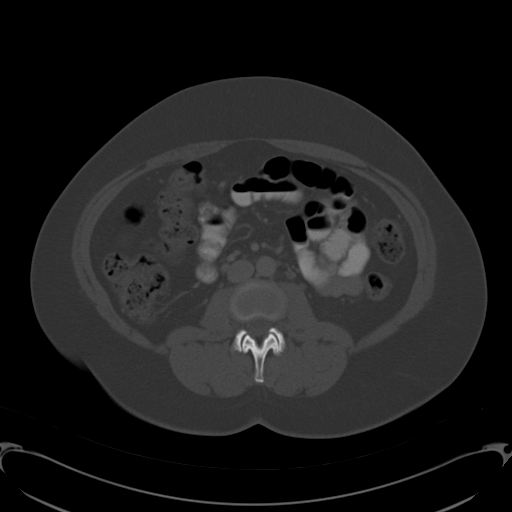
[im 65/88  soft-tissue]
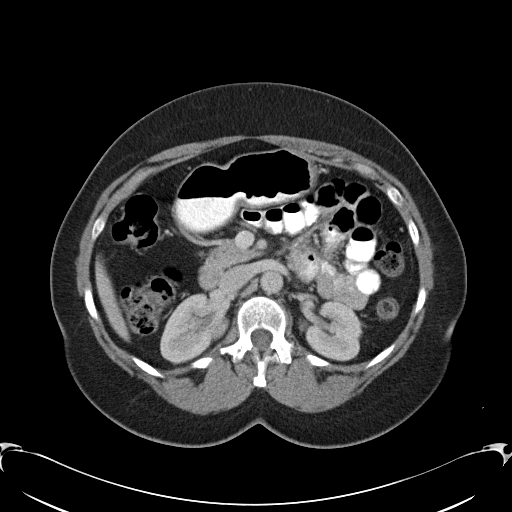
[im 69/88  soft-tissue]
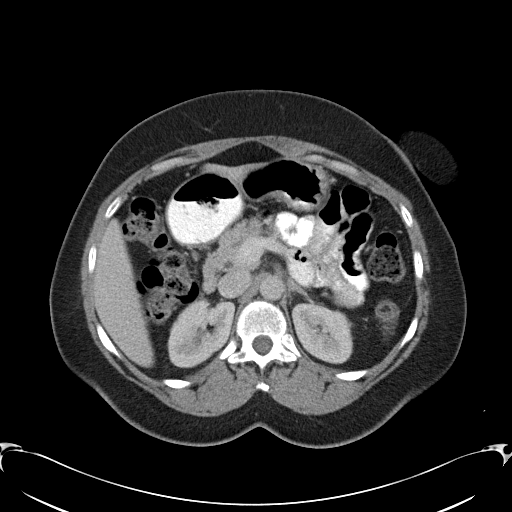
[im 74/88  soft-tissue]
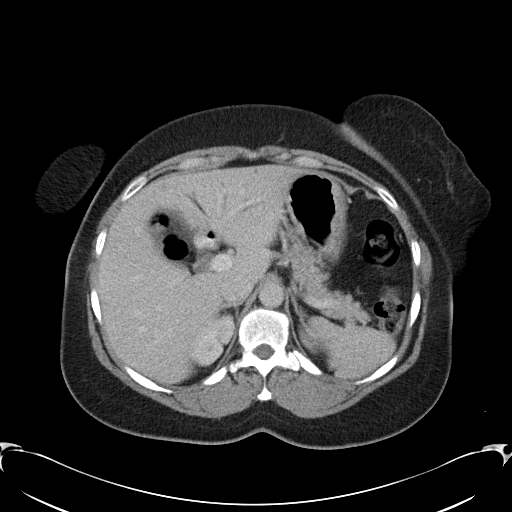
[im 83/88  soft-tissue]
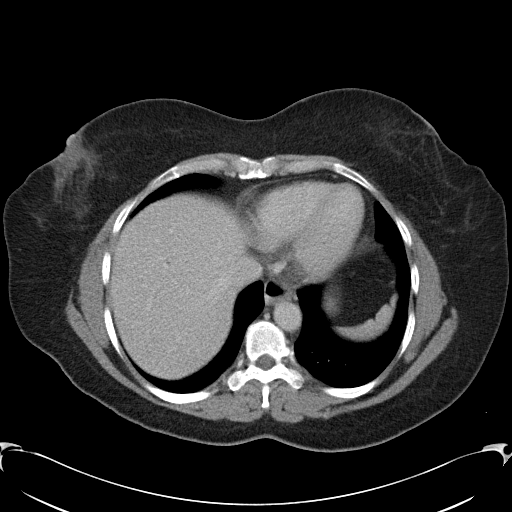

[Series 5: coronals · coronal · 0.90mm/px · 3 of 109 slices shown]
[im 37/109  soft-tissue]
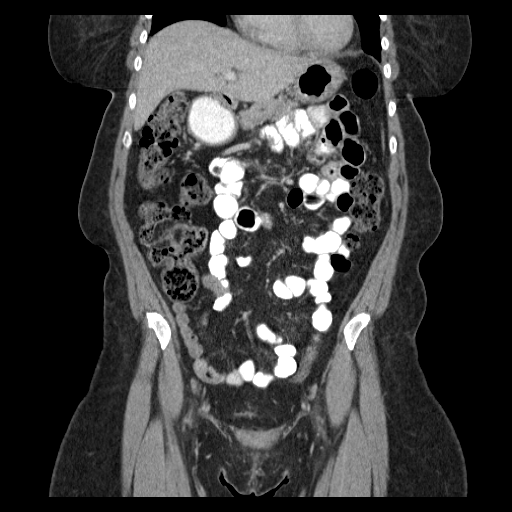
[im 49/109  soft-tissue]
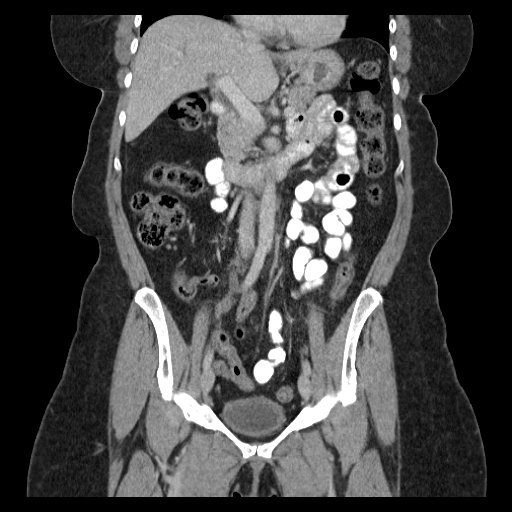
[im 61/109  soft-tissue]
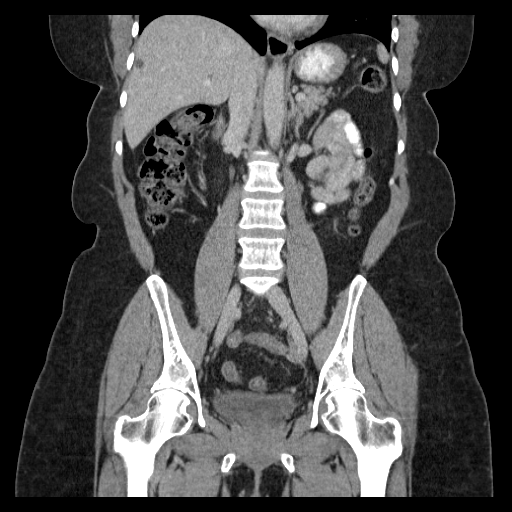

[16 of 46 positions shown; findings below may reference images not displayed]

FINDINGS: Lung bases clear.

No extraluminal bowel inflammatory process, free fluid or free air.
Specifically no inflammation surrounds the appendix.  Portions of
the stomach, descending colon and sigmoid colon are not distended
and evaluation limited.

Suggestion of small hiatal hernia.

Scattered small low density structures within the liver.  Larger
lesions appear to be cysts, some smaller lesions too small to
adequately characterize.

Intrahepatic biliary ducts appear prominent.  This may be related
to the patient's cholecystectomy state as no calcified common bile
duct stone or pancreatic head mass is identified.  Correlation with
liver function studies recommended.

No focal splenic, pancreatic, adrenal or renal lesion.

No abdominal aortic aneurysm.  Retroaortic left renal vein
incidentally noted.

No adenopathy.

Noncontrast filled partially decompressed urinary bladder without
gross abnormality.

No bony destructive lesion.

Mild diastasis of the rectus muscles without bowel containing
hernia.
IMPRESSION: No extraluminal bowel inflammatory process, free fluid or free air.
Specifically no inflammation surrounds the appendix.  Portions of
the stomach, descending colon and sigmoid colon are not distended
and evaluation limited.

Suggestion of small hiatal hernia.

Intrahepatic biliary ducts appear prominent.  This may be related
to the patient's cholecystectomy state as no calcified common bile
duct stone or pancreatic head mass is identified.  Correlation with
liver function studies recommended.

Please see above.

## 2014-04-18 ENCOUNTER — Other Ambulatory Visit: Payer: Self-pay | Admitting: Family Medicine

## 2014-04-19 NOTE — Telephone Encounter (Signed)
Px written for call in   

## 2014-04-19 NOTE — Telephone Encounter (Signed)
Electronic refill request, please advise  

## 2014-04-19 NOTE — Telephone Encounter (Signed)
Phoned in to pharmacy. 

## 2014-04-20 ENCOUNTER — Encounter: Payer: Self-pay | Admitting: Family Medicine

## 2014-04-20 ENCOUNTER — Ambulatory Visit (INDEPENDENT_AMBULATORY_CARE_PROVIDER_SITE_OTHER): Payer: 59 | Admitting: Family Medicine

## 2014-04-20 VITALS — BP 110/88 | HR 99 | Temp 98.2°F | Wt 170.5 lb

## 2014-04-20 DIAGNOSIS — M545 Low back pain, unspecified: Secondary | ICD-10-CM | POA: Insufficient documentation

## 2014-04-20 DIAGNOSIS — M549 Dorsalgia, unspecified: Secondary | ICD-10-CM

## 2014-04-20 LAB — POCT URINALYSIS DIPSTICK
Bilirubin, UA: NEGATIVE
Blood, UA: NEGATIVE
Glucose, UA: NEGATIVE
KETONES UA: NEGATIVE
LEUKOCYTES UA: NEGATIVE
Nitrite, UA: NEGATIVE
PH UA: 8
Spec Grav, UA: 1.01
UROBILINOGEN UA: NEGATIVE

## 2014-04-20 MED ORDER — CYCLOBENZAPRINE HCL 10 MG PO TABS: 5.0000 mg | ORAL_TABLET | Freq: Three times a day (TID) | ORAL | Status: DC | PRN

## 2014-04-20 NOTE — Assessment & Plan Note (Signed)
Likely benign MSK source, not at all likely to be uro-related given the benign u/a.  Flexeril with cautions, stretching and heat.  F/u prn.  She agrees.

## 2014-04-20 NOTE — Patient Instructions (Signed)
Use flexeril as needed- sedation caution.  Gently stretch and keep using your other meds. Use a heating pad and this should get better.   Take care.

## 2014-04-20 NOTE — Progress Notes (Signed)
Pre visit review using our clinic review tool, if applicable. No additional management support is needed unless otherwise documented below in the visit note.  R lower back pain.  Started about 2 days ago, getting worse in the meantime. She didn't know if it was a pulled muscle.  She had some urinary frequency but had increased her fluid intake prev.  Not burning with urination.  She feels like she voids completely.  No FCNAVD.  No trauma.  H/o back pain in the past.  No L sided pain.  No radicular pain. Constant pain.  No rash.    Meds, vitals, and allergies reviewed.   ROS: See HPI.  Otherwise, noncontributory.  nad ncat Mmm rrr ctab abd soft, not ttp, suprapubic area not ttp Back not ttp in midline, no CVA pain. R lateral lower back sore and ttp but no rash Ext w/o edema Gait wnl No weakness in the BLE

## 2014-04-24 ENCOUNTER — Other Ambulatory Visit: Payer: Self-pay | Admitting: *Deleted

## 2014-04-24 MED ORDER — CELECOXIB 200 MG PO CAPS: 200.0000 mg | ORAL_CAPSULE | Freq: Every day | ORAL | Status: DC | PRN

## 2014-04-24 NOTE — Telephone Encounter (Signed)
[facility] sent req for celebrex 200 mg # 90 3 refills. Filled per dr Milinda Pointer

## 2014-04-27 ENCOUNTER — Ambulatory Visit (INDEPENDENT_AMBULATORY_CARE_PROVIDER_SITE_OTHER): Payer: 59 | Admitting: Family Medicine

## 2014-04-27 ENCOUNTER — Encounter: Payer: Self-pay | Admitting: Family Medicine

## 2014-04-27 VITALS — BP 126/82 | HR 87 | Temp 97.9°F | Ht 61.75 in | Wt 170.5 lb

## 2014-04-27 DIAGNOSIS — Z8619 Personal history of other infectious and parasitic diseases: Secondary | ICD-10-CM | POA: Insufficient documentation

## 2014-04-27 DIAGNOSIS — B029 Zoster without complications: Secondary | ICD-10-CM

## 2014-04-27 MED ORDER — VALACYCLOVIR HCL 1 G PO TABS: 1000.0000 mg | ORAL_TABLET | Freq: Three times a day (TID) | ORAL | Status: DC

## 2014-04-27 NOTE — Patient Instructions (Signed)
You have shingles  Keep the rash clean and dry  Take tramadol for pain and continue the gabapentin (which helps also)  Take the valtrex as directed for one week  Update me if symptoms worsen   If you are interested in a shingles/zoster vaccine - call your insurance to check on coverage,( you should not get it within 1 month of other vaccines) , then call us for a prescription  for it to take to a pharmacy that gives the shot , or make a nurse visit to get it here depending on your coverage We would wait until this bout of shingles gets totally better      Shingles Shingles (herpes zoster) is an infection that is caused by the same virus that causes chickenpox (varicella). The infection causes a painful skin rash and fluid-filled blisters, which eventually break open, crust over, and heal. It may occur in any area of the body, but it usually affects only one side of the body or face. The pain of shingles usually lasts about 1 month. However, some people with shingles may develop long-term (chronic) pain in the affected area of the body. Shingles often occurs many years after the person had chickenpox. It is more common:  In people older than 50 years.  In people with weakened immune systems, such as those with HIV, AIDS, or cancer.  In people taking medicines that weaken the immune system, such as transplant medicines.  In people under great stress. CAUSES  Shingles is caused by the varicella zoster virus (VZV), which also causes chickenpox. After a person is infected with the virus, it can remain in the person's body for years in an inactive state (dormant). To cause shingles, the virus reactivates and breaks out as an infection in a nerve root. The virus can be spread from person to person (contagious) through contact with open blisters of the shingles rash. It will only spread to people who have not had chickenpox. When these people are exposed to the virus, they may develop chickenpox.  They will not develop shingles. Once the blisters scab over, the person is no longer contagious and cannot spread the virus to others. SYMPTOMS  Shingles shows up in stages. The initial symptoms may be pain, itching, and tingling in an area of the skin. This pain is usually described as burning, stabbing, or throbbing.In a few days or weeks, a painful red rash will appear in the area where the pain, itching, and tingling were felt. The rash is usually on one side of the body in a band or belt-like pattern. Then, the rash usually turns into fluid-filled blisters. They will scab over and dry up in approximately 2 3 weeks. Flu-like symptoms may also occur with the initial symptoms, the rash, or the blisters. These may include:  Fever.  Chills.  Headache.  Upset stomach. DIAGNOSIS  Your caregiver will perform a skin exam to diagnose shingles. Skin scrapings or fluid samples may also be taken from the blisters. This sample will be examined under a microscope or sent to a lab for further testing. TREATMENT  There is no specific cure for shingles. Your caregiver will likely prescribe medicines to help you manage the pain, recover faster, and avoid long-term problems. This may include antiviral drugs, anti-inflammatory drugs, and pain medicines. HOME CARE INSTRUCTIONS   Take a cool bath or apply cool compresses to the area of the rash or blisters as directed. This may help with the pain and itching.   Only  take over-the-counter or prescription medicines as directed by your caregiver.   Rest as directed by your caregiver.  Keep your rash and blisters clean with mild soap and cool water or as directed by your caregiver.  Do not pick your blisters or scratch your rash. Apply an anti-itch cream or numbing creams to the affected area as directed by your caregiver.  Keep your shingles rash covered with a loose bandage (dressing).  Avoid skin contact with:  Babies.   Pregnant women.    Children with eczema.   Elderly people with transplants.   People with chronic illnesses, such as leukemia or AIDS.   Wear loose-fitting clothing to help ease the pain of material rubbing against the rash.  Keep all follow-up appointments with your caregiver.If the area involved is on your face, you may receive a referral for follow-up to a specialist, such as an eye doctor (ophthalmologist) or an ear, nose, and throat (ENT) doctor. Keeping all follow-up appointments will help you avoid eye complications, chronic pain, or disability.  SEEK IMMEDIATE MEDICAL CARE IF:   You have facial pain, pain around the eye area, or loss of feeling on one side of your face.  You have ear pain or ringing in your ear.  You have loss of taste.  Your pain is not relieved with prescribed medicines.   Your redness or swelling spreads.   You have more pain and swelling.  Your condition is worsening or has changed.   You have a feveror persistent symptoms for more than 2 3 days.  You have a fever and your symptoms suddenly get worse. MAKE SURE YOU:  Understand these instructions.  Will watch your condition.  Will get help right away if you are not doing well or get worse. Document Released: 11/23/2005 Document Revised: 08/17/2012 Document Reviewed: 07/07/2012 [facility] Patient Information 2014 [place].
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
## 2014-04-27 NOTE — Progress Notes (Signed)
Pre visit review using our clinic review tool, if applicable. No additional management support is needed unless otherwise documented below in the visit note. 

## 2014-04-29 NOTE — Assessment & Plan Note (Signed)
On R back at T10-11 dermatome-corresp to pain prior to rash and now  Disc zoster and what it is/ expectations and tx -handout given  Given px for valtrex 1g tid for 7d Has tramadol and gabapentin for pain  Update if not starting to improve in a week or if worsening

## 2014-06-27 ENCOUNTER — Encounter: Payer: Self-pay | Admitting: Family Medicine

## 2014-06-27 ENCOUNTER — Ambulatory Visit (INDEPENDENT_AMBULATORY_CARE_PROVIDER_SITE_OTHER): Payer: 59 | Admitting: Family Medicine

## 2014-06-27 VITALS — BP 130/88 | HR 91 | Temp 98.4°F | Ht 61.75 in | Wt 170.5 lb

## 2014-06-27 DIAGNOSIS — M545 Low back pain, unspecified: Secondary | ICD-10-CM

## 2014-06-27 MED ORDER — CYCLOBENZAPRINE HCL 10 MG PO TABS: 5.0000 mg | ORAL_TABLET | Freq: Three times a day (TID) | ORAL | Status: DC | PRN
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
## 2014-06-27 NOTE — Progress Notes (Signed)
Pre visit review using our clinic review tool, if applicable. No additional management support is needed unless otherwise documented below in the visit note. 

## 2014-09-23 ENCOUNTER — Other Ambulatory Visit: Payer: Self-pay | Admitting: Family Medicine

## 2014-09-24 NOTE — Telephone Encounter (Signed)
Rx called in as prescribed 

## 2014-09-24 NOTE — Telephone Encounter (Signed)
Electronic refill request, please advise  

## 2014-09-24 NOTE — Telephone Encounter (Signed)
Px written for call in   

## 2014-10-08 ENCOUNTER — Encounter: Payer: Self-pay | Admitting: Family Medicine

## 2014-10-22 ENCOUNTER — Other Ambulatory Visit: Payer: Self-pay | Admitting: *Deleted

## 2014-10-22 MED ORDER — ESOMEPRAZOLE MAGNESIUM 20 MG PO CPDR: 20.0000 mg | DELAYED_RELEASE_CAPSULE | Freq: Every day | ORAL | Status: DC

## 2014-10-22 NOTE — Telephone Encounter (Signed)
Received fax requesting Rx be changed from 30 day to 90 day supply, done

## 2014-11-13 ENCOUNTER — Encounter: Payer: Self-pay | Admitting: Radiology

## 2014-11-13 ENCOUNTER — Ambulatory Visit (INDEPENDENT_AMBULATORY_CARE_PROVIDER_SITE_OTHER): Payer: 59 | Admitting: Family Medicine

## 2014-11-13 ENCOUNTER — Encounter: Payer: Self-pay | Admitting: Family Medicine

## 2014-11-13 VITALS — BP 108/80 | HR 87 | Temp 98.6°F | Ht 61.75 in | Wt 162.2 lb

## 2014-11-13 DIAGNOSIS — Z23 Encounter for immunization: Secondary | ICD-10-CM

## 2014-11-13 DIAGNOSIS — H9311 Tinnitus, right ear: Secondary | ICD-10-CM

## 2014-11-13 DIAGNOSIS — H9319 Tinnitus, unspecified ear: Secondary | ICD-10-CM | POA: Insufficient documentation

## 2014-11-13 DIAGNOSIS — K219 Gastro-esophageal reflux disease without esophagitis: Secondary | ICD-10-CM

## 2014-11-13 MED ORDER — ESOMEPRAZOLE MAGNESIUM 40 MG PO PACK: 40.0000 mg | PACK | Freq: Two times a day (BID) | ORAL | Status: DC

## 2014-11-13 NOTE — Progress Notes (Signed)
Subjective:    Patient ID: Mindy Long, female    DOB: Jan 27, 1961, 53 y.o.   MRN: [id number]  HPI Here with worsening acid reflux symptoms and ear problem    Usually nexium takes care of it -not no longer working for ? Last few months  She takes celebrex on and off - not this week  No nsaids otc  No change in diet   Taking nexium otc 20 mg (was on px 40 mg) - for approx 2 days (but flare had started before that) Prior to that she was not taking it every day   Started back with the 40 mg px now   Even worse after thanksgiving  She has cut caffeine   Feels glob in throat-needs to clear it  Not hoarse  Heartburn as well - worse at night / stomach bubbles at night  No n/v  No acid in mouth or st   Tinnitus in L ear is getting worse ? Pulsatile  Is bothersome -esp in middle of the night   Due for toxicology screen for tramadol Last dose was 2 nights ago     Patient Active Problem List   Diagnosis Date Noted  . Shingles 04/27/2014  . Back pain 04/20/2014  . OSA (obstructive sleep apnea) 09/27/2012  . Frequent urination 08/12/2012  . Snoring 06/21/2012  . Fatigue 06/20/2012  . BPPV (benign paroxysmal positional vertigo) 12/14/2011  . Colon cancer screening 11/04/2011  . Routine general medical examination at a health care facility 10/22/2011  . Foot pain 09/11/2011  . BACK PAIN 09/05/2010  . PLANTAR FASCIITIS 09/05/2010  . UNSPECIFIED VITAMIN D DEFICIENCY 07/02/2010  . DISTURBANCE OF SKIN SENSATION 04/07/2010  . PALPITATIONS 04/07/2010  . HYPOKALEMIA 07/10/2009  . PERIMENOPAUSAL STATUS 06/20/2009  . ALLERGIC RHINITIS 04/03/2008  . ESSENTIAL HYPERTENSION 01/25/2008  . G E R D 07/05/2007  . PEPTIC ULCER DISEASE 07/05/2007  . Headache, chronic daily 07/05/2007  . NEPHROLITHIASIS, HX OF 07/05/2007   Past Medical History  Diagnosis Date  . Headache(784.0)   . GERD (gastroesophageal reflux disease)   . Plantar fasciitis   . History of nephrolithiasis   .  Hypertension   . Back pain   . Ulcer 1980's    peptic ulcer disease  . Sleep apnea    Past Surgical History  Procedure Laterality Date  . Cholecystectomy  1984  . Abdominal hysterectomy  2002  . Oophorectomy    . Colonoscopy  12/18/2011    Normal   History  Substance Use Topics  . Smoking status: Never Smoker   . Smokeless tobacco: Never Used  . Alcohol Use: No   Family History  Problem Relation Age of Onset  . Stroke Father   . Hypertension Father   . Diabetes Father   . Cancer Brother     stomach tumor   Allergies  Allergen Reactions  . Atenolol Cough  . Hydrocodone Nausea Only   Current Outpatient Prescriptions on File Prior to Visit  Medication Sig Dispense Refill  . amLODipine (NORVASC) 5 MG tablet TAKE 1 TABLET (5 MG TOTAL) BY MOUTH DAILY. 30 tablet 11  . celecoxib (CELEBREX) 200 MG capsule Take 1 capsule (200 mg total) by mouth daily as needed. 90 capsule 3  . Cholecalciferol (VITAMIN D3) 2000 UNITS capsule Take 2,000 Units by mouth daily.      . cyclobenzaprine (FLEXERIL) 10 MG tablet Take 0.5-1 tablets (5-10 mg total) by mouth 3 (three) times daily as needed for muscle spasms. [place]  tablet 1  . esomeprazole (NEXIUM) 20 MG capsule Take 1 capsule (20 mg total) by mouth daily. 90 capsule 1  . estradiol (ESTRACE) 1 MG tablet Take 1 tablet (1 mg total) by mouth daily. 30 tablet 3  . gabapentin (NEURONTIN) 300 MG capsule Take 300 mg by mouth 3 (three) times daily.    . traMADol (ULTRAM) 50 MG tablet TAKE 1-2 TABLETS BYMOUTH EVERY 6 HOURS AS NEEDED 30 tablet 1   No current facility-administered medications on file prior to visit.    Review of Systems Review of Systems  Constitutional: Negative for fever, appetite change, fatigue and unexpected weight change.  Eyes: Negative for pain and visual disturbance.  ENT pos for L sided tinnitus with dec hearing/ neg for ear pain or fullness or d/c, neg for congestion  Respiratory: Negative for cough and shortness of breath.     Cardiovascular: Negative for cp or palpitations    Gastrointestinal: Negative for nausea, diarrhea and constipation. neg for blood in stool/ dark stool or abd pain  Genitourinary: Negative for urgency and frequency.  Skin: Negative for pallor or rash   Neurological: Negative for weakness, light-headedness, numbness and headaches.  Hematological: Negative for adenopathy. Does not bruise/bleed easily.  Psychiatric/Behavioral: Negative for dysphoric mood. The patient is not nervous/anxious.         Objective:   Physical Exam  Constitutional: She appears well-developed and well-nourished. No distress.  obese and well appearing   HENT:  Head: Normocephalic and atraumatic.  Right Ear: External ear normal.  Left Ear: External ear normal.  Nose: Nose normal.  Mouth/Throat: Oropharynx is clear and moist.  Eyes: Conjunctivae and EOM are normal. Pupils are equal, round, and reactive to light. Right eye exhibits no discharge. Left eye exhibits no discharge. No scleral icterus.  Neck: Normal range of motion. Neck supple. No JVD present. Carotid bruit is not present. No thyromegaly present.  Cardiovascular: Normal rate, regular rhythm, normal heart sounds and intact distal pulses.   Pulmonary/Chest: Effort normal and breath sounds normal. No respiratory distress. She has no wheezes. She has no rales.  Abdominal: Soft. Bowel sounds are normal. She exhibits no distension and no mass. There is no tenderness. There is no rebound and no guarding.  Musculoskeletal: She exhibits no edema.  Lymphadenopathy:    She has no cervical adenopathy.  Neurological: She is alert. She has normal reflexes.  Skin: Skin is warm and dry. No rash noted. No pallor.  Psychiatric: She has a normal mood and affect.          Assessment & Plan:   Problem List Items Addressed This Visit      Digestive   G E R D    Worsened after cutting nexium dose and after the holiday Heartburn and globus sens in throat  Will  inc nexium to 40 mg - bid for the first 1-2 weeks and then qd F/u 2 mo  Update earlier if no imp  Given diet info for gerd/ rev that with her        Relevant Medications      esomeprazole (NEXIUM) 40 MG packet     Other   Tinnitus - Primary    L sided/persistent  No bruits noted/nl exam Ref to ENT    Relevant Orders      Ambulatory referral to ENT    Other Visit Diagnoses    Need for influenza vaccination        Relevant Orders  Flu Vaccine QUAD 36+ mos PF IM (Fluarix Quad PF) (Completed)

## 2014-11-13 NOTE — Progress Notes (Signed)
Pre visit review using our clinic review tool, if applicable. No additional management support is needed unless otherwise documented below in the visit note. 

## 2014-11-13 NOTE — Patient Instructions (Addendum)
Increase your nexium to 40 mg twice daily - I sent this to your pharmacy  Here is a handout on diet  After a month if you are doing well cut to once daily  Follow up with me in about 2 months  If this does not improve let me know  Stop at check out for referral to ENT for the tinnitus in right ear Flu shot todayFood Choices for Gastroesophageal Reflux Disease When you have gastroesophageal reflux disease (GERD), the foods you eat and your eating habits are very important. Choosing the right foods can help ease the discomfort of GERD. WHAT GENERAL GUIDELINES DO I NEED TO FOLLOW?  Choose fruits, vegetables, whole grains, low-fat dairy products, and low-fat meat, fish, and poultry.  Limit fats such as oils, salad dressings, butter, nuts, and avocado.  Keep a food diary to identify foods that cause symptoms.  Avoid foods that cause reflux. These may be different for different people.  Eat frequent small meals instead of three large meals each day.  Eat your meals slowly, in a relaxed setting.  Limit fried foods.  Cook foods using methods other than frying.  Avoid drinking alcohol.  Avoid drinking large amounts of liquids with your meals.  Avoid bending over or lying down until 2-3 hours after eating. WHAT FOODS ARE NOT RECOMMENDED? The following are some foods and drinks that may worsen your symptoms: Vegetables Tomatoes. Tomato juice. Tomato and spaghetti sauce. Chili peppers. Onion and garlic. Horseradish. Fruits Oranges, grapefruit, and lemon (fruit and juice). Meats High-fat meats, fish, and poultry. This includes hot dogs, ribs, ham, sausage, salami, and bacon. Dairy Whole milk and chocolate milk. Sour cream. Cream. Butter. Ice cream. Cream cheese.  Beverages Coffee and tea, with or without caffeine. Carbonated beverages or energy drinks. Condiments Hot sauce. Barbecue sauce.  Sweets/Desserts Chocolate and cocoa. Donuts. Peppermint and spearmint. Fats and  Oils High-fat foods, including [place] fries and potato chips. Other Vinegar. Strong spices, such as black pepper, white pepper, red pepper, cayenne, curry powder, cloves, ginger, and chili powder. The items listed above may not be a complete list of foods and beverages to avoid. Contact your dietitian for more information. Document Released: 11/23/2005 Document Revised: 11/28/2013 Document Reviewed: 09/27/2013 [facility] Patient Information 2015 [place], [place]. This information is not intended to replace advice given to you by your health care provider. Make sure you discuss any questions you have with your health care provider.

## 2014-11-15 NOTE — Assessment & Plan Note (Signed)
Worsened after cutting nexium dose and after the holiday Heartburn and globus sens in throat  Will inc nexium to 40 mg - bid for the first 1-2 weeks and then qd F/u 2 mo  Update earlier if no imp  Given diet info for gerd/ rev that with her

## 2014-11-15 NOTE — Assessment & Plan Note (Signed)
L sided/persistent  No bruits noted/nl exam Ref to ENT

## 2014-11-26 ENCOUNTER — Ambulatory Visit: Payer: Self-pay | Admitting: Unknown Physician Specialty

## 2014-11-27 ENCOUNTER — Encounter: Payer: Self-pay | Admitting: Family Medicine

## 2014-12-31 ENCOUNTER — Other Ambulatory Visit: Payer: Self-pay

## 2014-12-31 DIAGNOSIS — Z1231 Encounter for screening mammogram for malignant neoplasm of breast: Secondary | ICD-10-CM

## 2015-01-16 ENCOUNTER — Ambulatory Visit
Admission: RE | Admit: 2015-01-16 | Discharge: 2015-01-16 | Disposition: A | Discharge status: Home / Self Care | Payer: 59 | Source: Ambulatory Visit

## 2015-01-16 DIAGNOSIS — Z1231 Encounter for screening mammogram for malignant neoplasm of breast: Secondary | ICD-10-CM

## 2015-02-19 ENCOUNTER — Other Ambulatory Visit: Payer: Self-pay | Admitting: Family Medicine

## 2015-03-13 ENCOUNTER — Other Ambulatory Visit: Payer: Self-pay

## 2015-03-13 ENCOUNTER — Telehealth: Payer: Self-pay | Admitting: Family Medicine

## 2015-03-13 DIAGNOSIS — E559 Vitamin D deficiency, unspecified: Secondary | ICD-10-CM

## 2015-03-13 DIAGNOSIS — Z0000 Encounter for general adult medical examination without abnormal findings: Secondary | ICD-10-CM

## 2015-03-13 NOTE — Telephone Encounter (Signed)
-----   Message from Ellamae Sia sent at 03/12/2015  2:43 PM EDT ----- Regarding: Lab orders for Thursday, 4.7.16 Patient is scheduled for CPX labs, please order future labs, Thanks , [name]

## 2015-03-14 ENCOUNTER — Other Ambulatory Visit (INDEPENDENT_AMBULATORY_CARE_PROVIDER_SITE_OTHER): Payer: 59

## 2015-03-14 DIAGNOSIS — E559 Vitamin D deficiency, unspecified: Secondary | ICD-10-CM

## 2015-03-14 DIAGNOSIS — Z0000 Encounter for general adult medical examination without abnormal findings: Secondary | ICD-10-CM

## 2015-03-14 LAB — COMPREHENSIVE METABOLIC PANEL
ALK PHOS: 100 U/L (ref 39–117)
ALT: 18 U/L (ref 0–35)
AST: 23 U/L (ref 0–37)
Albumin: 3.6 g/dL (ref 3.5–5.2)
BUN: 11 mg/dL (ref 6–23)
CALCIUM: 9 mg/dL (ref 8.4–10.5)
CO2: 26 mEq/L (ref 19–32)
Chloride: 107 mEq/L (ref 96–112)
Creatinine, Ser: 0.87 mg/dL (ref 0.40–1.20)
GFR: 86.68 mL/min (ref >60.00)
GLUCOSE: 91 mg/dL (ref 70–99)
Potassium: 3.6 mEq/L (ref 3.5–5.1)
Sodium: 140 mEq/L (ref 135–145)
Total Bilirubin: 0.4 mg/dL (ref 0.2–1.2)
Total Protein: 7 g/dL (ref 6.0–8.3)

## 2015-03-14 LAB — LIPID PANEL
CHOLESTEROL: 191 mg/dL (ref 0–200)
HDL: 79.1 mg/dL (ref >39.00)
LDL Cholesterol: 105 mg/dL — ABNORMAL HIGH (ref 0–99)
NonHDL: 111.9
Total CHOL/HDL Ratio: 2
Triglycerides: 37 mg/dL (ref 0.0–149.0)
VLDL: 7.4 mg/dL (ref 0.0–40.0)

## 2015-03-14 LAB — CBC WITH DIFFERENTIAL/PLATELET
Basophils Absolute: 0 10*3/uL (ref 0.0–0.1)
Basophils Relative: 0.4 % (ref 0.0–3.0)
EOS ABS: 0.1 10*3/uL (ref 0.0–0.7)
Eosinophils Relative: 2.5 % (ref 0.0–5.0)
HCT: 39.7 % (ref 36.0–46.0)
Hemoglobin: 13.2 g/dL (ref 12.0–15.0)
Lymphocytes Relative: 46.6 % — ABNORMAL HIGH (ref 12.0–46.0)
Lymphs Abs: 2.3 10*3/uL (ref 0.7–4.0)
MCHC: 33.2 g/dL (ref 30.0–36.0)
MCV: 76.3 fl — ABNORMAL LOW (ref 78.0–100.0)
MONO ABS: 0.4 10*3/uL (ref 0.1–1.0)
Monocytes Relative: 8.3 % (ref 3.0–12.0)
NEUTROS PCT: 42.2 % — AB (ref 43.0–77.0)
Neutro Abs: 2 10*3/uL (ref 1.4–7.7)
Platelets: 242 10*3/uL (ref 150.0–400.0)
RBC: 5.2 Mil/uL — ABNORMAL HIGH (ref 3.87–5.11)
RDW: 14.3 % (ref 11.5–15.5)
WBC: 4.8 10*3/uL (ref 4.0–10.5)

## 2015-03-14 LAB — TSH: TSH: 2.01 u[IU]/mL (ref 0.35–4.50)

## 2015-03-14 LAB — VITAMIN D 25 HYDROXY (VIT D DEFICIENCY, FRACTURES): VITD: 25.04 ng/mL — AB (ref 30.00–100.00)

## 2015-03-20 ENCOUNTER — Encounter: Payer: Self-pay | Admitting: Family Medicine

## 2015-03-20 ENCOUNTER — Ambulatory Visit (INDEPENDENT_AMBULATORY_CARE_PROVIDER_SITE_OTHER): Payer: 59 | Admitting: Family Medicine

## 2015-03-20 VITALS — BP 118/88 | HR 94 | Temp 98.9°F | Ht 62.0 in | Wt 159.8 lb

## 2015-03-20 DIAGNOSIS — Z0000 Encounter for general adult medical examination without abnormal findings: Secondary | ICD-10-CM | POA: Diagnosis not present

## 2015-03-20 DIAGNOSIS — E559 Vitamin D deficiency, unspecified: Secondary | ICD-10-CM

## 2015-03-20 DIAGNOSIS — I10 Essential (primary) hypertension: Secondary | ICD-10-CM

## 2015-03-20 MED ORDER — ESOMEPRAZOLE MAGNESIUM 40 MG PO PACK: 40.0000 mg | PACK | Freq: Two times a day (BID) | ORAL | Status: DC

## 2015-03-20 NOTE — Patient Instructions (Signed)
Increase your daily vitamin D3 to 4000 iu  Time outdoors helps also  Stay active Eat a healthy diet

## 2015-03-20 NOTE — Progress Notes (Signed)
Pre visit review using our clinic review tool, if applicable. No additional management support is needed unless otherwise documented below in the visit note. 

## 2015-03-20 NOTE — Progress Notes (Signed)
Subjective:    Patient ID: Mindy Long, female    DOB: 12/28/1960, 54 y.o.   MRN: [id number]  HPI Here for health maintenance exam and to review chronic medical problems    Suffering from allergies -calming down some  Still having ear problem  Had carotid doppler and brain imaging  tinnitis - learning how to tune it out (has had 2 hearing tests)   gerd symptoms come and go - really keeping track of diet  Has had to stay away from tomato products and OJ  Taking nexium daily   Wt is down 3 lb with bmi of 29 Is eating a healthy diet  Working on exercise -walking /enjoys that also has a clubhouse gym   Hep C/ HIV screen - does not think she is high risk   Pap 6/09 nl and had hysterectomy No gyn issues or problems   Mammogram 2/16- nl Self exam- nolumps or change   Flu vaccine 12/15  Td 7/10  colonosc 1/13 nl - 10 year recall   Hx of vit D def Level is 25  Taking 2000 iu daily -no side eff   bp is stable today  No cp or palpitations or headaches or edema  No side effects to medicines  BP Readings from Last 3 Encounters:  03/20/15 118/88  11/13/14 108/80  06/27/14 130/88      Cholesterol Lab Results  Component Value Date   CHOL 191 03/14/2015   CHOL 222* 02/20/2014   CHOL 191 02/13/2013   Lab Results  Component Value Date   HDL 79.10 03/14/2015   HDL 98.80 02/20/2014   HDL 72.20 02/13/2013   Lab Results  Component Value Date   LDLCALC 105* 03/14/2015   LDLCALC 114* 02/20/2014   LDLCALC 106* 02/13/2013   Lab Results  Component Value Date   TRIG 37.0 03/14/2015   TRIG 47.0 02/20/2014   TRIG 64.0 02/13/2013   Lab Results  Component Value Date   CHOLHDL 2 03/14/2015   CHOLHDL 2 02/20/2014   CHOLHDL 3 02/13/2013   Lab Results  Component Value Date   LDLDIRECT 126.7 10/23/2011   LDLDIRECT 132.9 06/20/2009   LDLDIRECT 109.4 01/25/2008    Very good cholesterol profile  Results for orders placed or performed in visit on 03/14/15  CBC with  Differential/Platelet  Result Value Ref Range   WBC 4.8 4.0 - 10.5 K/uL   RBC 5.20 (H) 3.87 - 5.11 Mil/uL   Hemoglobin 13.2 12.0 - 15.0 g/dL   HCT 39.7 36.0 - 46.0 %   MCV 76.3 (L) 78.0 - 100.0 fl   MCHC 33.2 30.0 - 36.0 g/dL   RDW 14.3 11.5 - 15.5 %   Platelets 242.0 150.0 - 400.0 K/uL   Neutrophils Relative % 42.2 (L) 43.0 - 77.0 %   Lymphocytes Relative 46.6 (H) 12.0 - 46.0 %   Monocytes Relative 8.3 3.0 - 12.0 %   Eosinophils Relative 2.5 0.0 - 5.0 %   Basophils Relative 0.4 0.0 - 3.0 %   Neutro Abs 2.0 1.4 - 7.7 K/uL   Lymphs Abs 2.3 0.7 - 4.0 K/uL   Monocytes Absolute 0.4 0.1 - 1.0 K/uL   Eosinophils Absolute 0.1 0.0 - 0.7 K/uL   Basophils Absolute 0.0 0.0 - 0.1 K/uL  Comprehensive metabolic panel  Result Value Ref Range   Sodium 140 135 - 145 mEq/L   Potassium 3.6 3.5 - 5.1 mEq/L   Chloride 107 96 - 112 mEq/L   CO2 26 19 -  32 mEq/L   Glucose, Bld 91 70 - 99 mg/dL   BUN 11 6 - 23 mg/dL   Creatinine, Ser 0.87 0.40 - 1.20 mg/dL   Total Bilirubin 0.4 0.2 - 1.2 mg/dL   Alkaline Phosphatase 100 39 - 117 U/L   AST 23 0 - 37 U/L   ALT 18 0 - 35 U/L   Total Protein 7.0 6.0 - 8.3 g/dL   Albumin 3.6 3.5 - 5.2 g/dL   Calcium 9.0 8.4 - 10.5 mg/dL   GFR 86.68 >60.00 mL/min  Lipid panel  Result Value Ref Range   Cholesterol 191 0 - 200 mg/dL   Triglycerides 37.0 0.0 - 149.0 mg/dL   HDL 79.10 >39.00 mg/dL   VLDL 7.4 0.0 - 40.0 mg/dL   LDL Cholesterol 105 (H) 0 - 99 mg/dL   Total CHOL/HDL Ratio 2    NonHDL 111.90   TSH  Result Value Ref Range   TSH 2.01 0.35 - 4.50 uIU/mL  Vit D  25 hydroxy (rtn osteoporosis monitoring)  Result Value Ref Range   VITD 25.04 (L) 30.00 - 100.00 ng/mL      Patient Active Problem List   Diagnosis Date Noted  . Tinnitus 11/13/2014  . Shingles 04/27/2014  . Back pain 04/20/2014  . OSA (obstructive sleep apnea) 09/27/2012  . Frequent urination 08/12/2012  . Snoring 06/21/2012  . Fatigue 06/20/2012  . BPPV (benign paroxysmal positional  vertigo) 12/14/2011  . Colon cancer screening 11/04/2011  . Routine general medical examination at a health care facility 10/22/2011  . Foot pain 09/11/2011  . BACK PAIN 09/05/2010  . PLANTAR FASCIITIS 09/05/2010  . Vitamin D deficiency 07/02/2010  . DISTURBANCE OF SKIN SENSATION 04/07/2010  . PALPITATIONS 04/07/2010  . HYPOKALEMIA 07/10/2009  . PERIMENOPAUSAL STATUS 06/20/2009  . ALLERGIC RHINITIS 04/03/2008  . Essential hypertension 01/25/2008  . G E R D 07/05/2007  . PEPTIC ULCER DISEASE 07/05/2007  . Headache, chronic daily 07/05/2007  . NEPHROLITHIASIS, HX OF 07/05/2007   Past Medical History  Diagnosis Date  . Headache(784.0)   . GERD (gastroesophageal reflux disease)   . Plantar fasciitis   . History of nephrolithiasis   . Hypertension   . Back pain   . Ulcer 1980's    peptic ulcer disease  . Sleep apnea    Past Surgical History  Procedure Laterality Date  . Cholecystectomy  1984  . Abdominal hysterectomy  2002  . Oophorectomy    . Colonoscopy  12/18/2011    Normal   History  Substance Use Topics  . Smoking status: Never Smoker   . Smokeless tobacco: Never Used  . Alcohol Use: No   Family History  Problem Relation Age of Onset  . Stroke Father   . Hypertension Father   . Diabetes Father   . Cancer Brother     stomach tumor   Allergies  Allergen Reactions  . Atenolol Cough  . Hydrocodone Nausea Only   Current Outpatient Prescriptions on File Prior to Visit  Medication Sig Dispense Refill  . amLODipine (NORVASC) 5 MG tablet TAKE 1 TABLET (5 MG TOTAL) BY MOUTH DAILY. 90 tablet 3  . celecoxib (CELEBREX) 200 MG capsule Take 1 capsule (200 mg total) by mouth daily as needed. 90 capsule 3  . Cholecalciferol (VITAMIN D3) 2000 UNITS capsule Take 2,000 Units by mouth daily.      Marland Kitchen esomeprazole (NEXIUM) 40 MG packet Take 40 mg by mouth 2 (two) times daily. 60 each 5  . gabapentin (NEURONTIN)  300 MG capsule Take 300 mg by mouth 3 (three) times daily.    .  traMADol (ULTRAM) 50 MG tablet TAKE 1-2 TABLETS BYMOUTH EVERY 6 HOURS AS NEEDED 30 tablet 1   No current facility-administered medications on file prior to visit.    Review of Systems Review of Systems  Constitutional: Negative for fever, appetite change, fatigue and unexpected weight change.  Eyes: Negative for pain and visual disturbance.  Respiratory: Negative for cough and shortness of breath.   Cardiovascular: Negative for cp or palpitations    Gastrointestinal: Negative for nausea, diarrhea and constipation.  Genitourinary: Negative for urgency and frequency.  Skin: Negative for pallor or rash   Neurological: Negative for weakness, light-headedness, numbness and headaches.  Hematological: Negative for adenopathy. Does not bruise/bleed easily.  Psychiatric/Behavioral: Negative for dysphoric mood. The patient is not nervous/anxious.         Objective:   Physical Exam  Constitutional: She appears well-developed and well-nourished. No distress.  obese and well appearing   HENT:  Head: Normocephalic and atraumatic.  Right Ear: External ear normal.  Left Ear: External ear normal.  Mouth/Throat: Oropharynx is clear and moist.  Eyes: Conjunctivae and EOM are normal. Pupils are equal, round, and reactive to light. No scleral icterus.  Neck: Normal range of motion. Neck supple. No JVD present. Carotid bruit is not present. No thyromegaly present.  Cardiovascular: Normal rate, regular rhythm, normal heart sounds and intact distal pulses.  Exam reveals no gallop.   Pulmonary/Chest: Effort normal and breath sounds normal. No respiratory distress. She has no wheezes. She exhibits no tenderness.  Abdominal: Soft. Bowel sounds are normal. She exhibits no distension, no abdominal bruit and no mass. There is no tenderness.  Genitourinary: No breast swelling, tenderness, discharge or bleeding.  Breast exam: No mass, nodules, thickening, tenderness, bulging, retraction, inflamation, nipple  discharge or skin changes noted.  No axillary or clavicular LA.      Musculoskeletal: Normal range of motion. She exhibits no edema or tenderness.  No kyphosis   Lymphadenopathy:    She has no cervical adenopathy.  Neurological: She is alert. She has normal reflexes. No cranial nerve deficit. She exhibits normal muscle tone. Coordination normal.  Skin: Skin is warm and dry. No rash noted. No erythema. No pallor.  Psychiatric: She has a normal mood and affect.          Assessment & Plan:   Problem List Items Addressed This Visit      Cardiovascular and Mediastinum   Essential hypertension - Primary    bp in fair control at this time  BP Readings from Last 1 Encounters:  03/20/15 118/88   No changes needed Disc lifstyle change with low sodium diet and exercise  Labs rev        Other   Routine general medical examination at a health care facility    Reviewed health habits including diet and exercise and skin cancer prevention Reviewed appropriate screening tests for age  Also reviewed health mt list, fam hx and immunization status , as well as social and family history   See HPI Labs rev increase your daily vitamin D3 to 4000 iu  Time outdoors helps also  Stay active Eat a healthy diet       Vitamin D deficiency    Level low at 25 Disc imp to bone and overall health Enc to inc dose to 4000 iu daily

## 2015-03-21 NOTE — Assessment & Plan Note (Signed)
Level low at 25 Disc imp to bone and overall health Enc to inc dose to 4000 iu daily

## 2015-03-21 NOTE — Assessment & Plan Note (Signed)
Reviewed health habits including diet and exercise and skin cancer prevention Reviewed appropriate screening tests for age  Also reviewed health mt list, fam hx and immunization status , as well as social and family history   See HPI Labs rev increase your daily vitamin D3 to 4000 iu  Time outdoors helps also  Stay active Eat a healthy diet

## 2015-03-21 NOTE — Assessment & Plan Note (Signed)
bp in fair control at this time  BP Readings from Last 1 Encounters:  03/20/15 118/88   No changes needed Disc lifstyle change with low sodium diet and exercise  Labs rev

## 2015-04-12 ENCOUNTER — Other Ambulatory Visit: Payer: Self-pay | Admitting: *Deleted

## 2015-04-12 MED ORDER — ESOMEPRAZOLE MAGNESIUM 40 MG PO PACK: 40.0000 mg | PACK | Freq: Two times a day (BID) | ORAL | Status: DC

## 2015-04-12 NOTE — Telephone Encounter (Signed)
Received fax saying pt would like 90 day supply of Rx instead of 30 day for ins. done

## 2015-04-30 ENCOUNTER — Telehealth: Payer: Self-pay | Admitting: Family Medicine

## 2015-04-30 NOTE — Telephone Encounter (Signed)
Done and in In box

## 2015-04-30 NOTE — Telephone Encounter (Signed)
Form placed in your inbox

## 2015-04-30 NOTE — Telephone Encounter (Signed)
Spouse dropped off [facility] handicap card form. Please call [phone] when ready to pick up. Placing on Shapale's desk, thanks

## 2015-04-30 NOTE — Telephone Encounter (Signed)
Left voicemail letting pt know form ready for pick up 

## 2015-05-17 ENCOUNTER — Ambulatory Visit (INDEPENDENT_AMBULATORY_CARE_PROVIDER_SITE_OTHER): Payer: 59 | Admitting: Internal Medicine

## 2015-05-17 ENCOUNTER — Encounter: Payer: Self-pay | Admitting: Internal Medicine

## 2015-05-17 VITALS — BP 118/78 | HR 74 | Temp 98.1°F | Wt 162.0 lb

## 2015-05-17 DIAGNOSIS — L02219 Cutaneous abscess of trunk, unspecified: Secondary | ICD-10-CM | POA: Diagnosis not present

## 2015-05-17 DIAGNOSIS — L03319 Cellulitis of trunk, unspecified: Secondary | ICD-10-CM

## 2015-05-17 MED ORDER — DOXYCYCLINE HYCLATE 100 MG PO CAPS: 100.0000 mg | ORAL_CAPSULE | Freq: Two times a day (BID) | ORAL | Status: DC

## 2015-05-17 NOTE — Progress Notes (Signed)
Pre visit review using our clinic review tool, if applicable. No additional management support is needed unless otherwise documented below in the visit note. 

## 2015-05-17 NOTE — Progress Notes (Signed)
Subjective:    Patient ID: Mindy Long, female    DOB: 11-23-1961, 54 y.o.   MRN: [id number]  HPI  Pt presents to the clinic today with c/o a lump under her left breast. She noticed this 1 week ago but reports it has become painful over the last 3 days. She has never had anything like this before. She has been putting warm compresses on it without any relief. She has not tried anything OTC. She denies fever, chills or body aches.  Review of Systems      Past Medical History  Diagnosis Date  . Headache(784.0)   . GERD (gastroesophageal reflux disease)   . Plantar fasciitis   . History of nephrolithiasis   . Hypertension   . Back pain   . Ulcer 1980's    peptic ulcer disease  . Sleep apnea     Current Outpatient Prescriptions  Medication Sig Dispense Refill  . amLODipine (NORVASC) 5 MG tablet TAKE 1 TABLET (5 MG TOTAL) BY MOUTH DAILY. 90 tablet 3  . celecoxib (CELEBREX) 200 MG capsule Take 1 capsule (200 mg total) by mouth daily as needed. 90 capsule 3  . Cholecalciferol (VITAMIN D3) 2000 UNITS capsule Take 2,000 Units by mouth daily.      Marland Kitchen esomeprazole (NEXIUM) 40 MG packet Take 40 mg by mouth 2 (two) times daily. 180 each 3  . gabapentin (NEURONTIN) 300 MG capsule Take 300 mg by mouth 3 (three) times daily.    . traMADol (ULTRAM) 50 MG tablet TAKE 1-2 TABLETS BYMOUTH EVERY 6 HOURS AS NEEDED 30 tablet 1  . doxycycline (VIBRAMYCIN) 100 MG capsule Take 1 capsule (100 mg total) by mouth 2 (two) times daily. 20 capsule 0   No current facility-administered medications for this visit.    Allergies  Allergen Reactions  . Atenolol Cough  . Hydrocodone Nausea Only    Family History  Problem Relation Age of Onset  . Stroke Father   . Hypertension Father   . Diabetes Father   . Cancer Brother     stomach tumor    History   Social History  . Marital Status: Married    Spouse Name: N/A  . Number of Children: N/A  . Years of Education: N/A   Occupational History  .  ar specialist    Social History Main Topics  . Smoking status: Never Smoker   . Smokeless tobacco: Never Used  . Alcohol Use: No  . Drug Use: No  . Sexual Activity: Not on file   Other Topics Concern  . Not on file   Social History Narrative   Walks for exercise     Constitutional: Denies fever, malaise, fatigue, headache or abrupt weight changes.  Respiratory: Denies difficulty breathing, shortness of breath, cough or sputum production.   Cardiovascular: Denies chest pain, chest tightness, palpitations or swelling in the hands or feet.  Skin: Pt reports a lump under her left breast. Denies redness, rashes, lesions or ulcercations.   No other specific complaints in a complete review of systems (except as listed in HPI above).   Objective:   Physical Exam   BP 118/78 mmHg  Pulse 74  Temp(Src) 98.1 F (36.7 C) (Oral)  Wt 162 lb (73.483 kg)  SpO2 98% Wt Readings from Last 3 Encounters:  05/17/15 162 lb (73.483 kg)  03/20/15 159 lb 12.8 oz (72.485 kg)  11/13/14 162 lb 4 oz (73.596 kg)    General: Appears her stated age, well developed, well nourished  in NAD. Skin: Pea size abscess noted under left breast with quarter size area of surrounding cellulitis. Cardiovascular: Normal rate and rhythm. S1,S2 noted.  Pulmonary/Chest: Normal effort and positive vesicular breath sounds. No respiratory distress. No wheezes, rales or ronchi noted.    BMET    Component Value Date/Time   NA 140 03/14/2015 0856   K 3.6 03/14/2015 0856   CL 107 03/14/2015 0856   CO2 26 03/14/2015 0856   GLUCOSE 91 03/14/2015 0856   BUN 11 03/14/2015 0856   CREATININE 0.87 03/14/2015 0856   CALCIUM 9.0 03/14/2015 0856   GFRNONAA 66* 01/16/2013 1028   GFRAA 76* 01/16/2013 1028    Lipid Panel     Component Value Date/Time   CHOL 191 03/14/2015 0856   TRIG 37.0 03/14/2015 0856   HDL 79.10 03/14/2015 0856   CHOLHDL 2 03/14/2015 0856   VLDL 7.4 03/14/2015 0856   LDLCALC 105* 03/14/2015 0856      CBC    Component Value Date/Time   WBC 4.8 03/14/2015 0856   RBC 5.20* 03/14/2015 0856   HGB 13.2 03/14/2015 0856   HCT 39.7 03/14/2015 0856   PLT 242.0 03/14/2015 0856   MCV 76.3* 03/14/2015 0856   MCH 24.9* 01/16/2013 1028   MCHC 33.2 03/14/2015 0856   RDW 14.3 03/14/2015 0856   LYMPHSABS 2.3 03/14/2015 0856   MONOABS 0.4 03/14/2015 0856   EOSABS 0.1 03/14/2015 0856   BASOSABS 0.0 03/14/2015 0856    Hgb A1C No results found for: HGBA1C      Assessment & Plan:   Abscess and cellulitis of trunk:  Continue warm compresses TID Ibuprofen as needed for pain eRx for Doxycycline BID x 10 days Watch for increased pain, redness, drainage or fevers  RTC as needed or if symptoms persist or worsen

## 2015-05-17 NOTE — Patient Instructions (Addendum)
Warm compresses three times a day Ibuprofen as needed for pain I have called in an antibiotic into your pharmacy, take as directed  Abscess An abscess is an infected area that contains a collection of pus and debris.It can occur in almost any part of the body. An abscess is also known as a furuncle or boil. CAUSES  An abscess occurs when tissue gets infected. This can occur from blockage of oil or sweat glands, infection of hair follicles, or a minor injury to the skin. As the body tries to fight the infection, pus collects in the area and creates pressure under the skin. This pressure causes pain. People with weakened immune systems have difficulty fighting infections and get certain abscesses more often.  SYMPTOMS Usually an abscess develops on the skin and becomes a painful mass that is red, warm, and tender. If the abscess forms under the skin, you may feel a moveable soft area under the skin. Some abscesses break open (rupture) on their own, but most will continue to get worse without care. The infection can spread deeper into the body and eventually into the bloodstream, causing you to feel ill.  DIAGNOSIS  Your caregiver will take your medical history and perform a physical exam. A sample of fluid may also be taken from the abscess to determine what is causing your infection. TREATMENT  Your caregiver may prescribe antibiotic medicines to fight the infection. However, taking antibiotics alone usually does not cure an abscess. Your caregiver may need to make a small cut (incision) in the abscess to drain the pus. In some cases, gauze is packed into the abscess to reduce pain and to continue draining the area. HOME CARE INSTRUCTIONS   Only take over-the-counter or prescription medicines for pain, discomfort, or fever as directed by your caregiver.  If you were prescribed antibiotics, take them as directed. Finish them even if you start to feel better.  If gauze is used, follow your  caregiver's directions for changing the gauze.  To avoid spreading the infection:  Keep your draining abscess covered with a bandage.  Wash your hands well.  Do not share personal care items, towels, or whirlpools with others.  Avoid skin contact with others.  Keep your skin and clothes clean around the abscess.  Keep all follow-up appointments as directed by your caregiver. SEEK MEDICAL CARE IF:   You have increased pain, swelling, redness, fluid drainage, or bleeding.  You have muscle aches, chills, or a general ill feeling.  You have a fever. MAKE SURE YOU:   Understand these instructions.  Will watch your condition.  Will get help right away if you are not doing well or get worse. Document Released: 09/02/2005 Document Revised: 05/24/2012 Document Reviewed: 02/05/2012 [facility] Patient Information 2015 [place], [place]. This information is not intended to replace advice given to you by your health care provider. Make sure you discuss any questions you have with your health care provider.

## 2015-05-28 ENCOUNTER — Ambulatory Visit: Payer: 59 | Admitting: Obstetrics & Gynecology

## 2015-06-12 ENCOUNTER — Other Ambulatory Visit: Payer: Self-pay | Admitting: Family Medicine

## 2015-06-12 NOTE — Telephone Encounter (Signed)
Px written for call in   

## 2015-06-12 NOTE — Telephone Encounter (Signed)
Rx called in as prescribed 

## 2015-06-12 NOTE — Telephone Encounter (Signed)
Electronic refill request, pt had CPE on 03/20/15, last refilled on 09/24/14 #30 with 1 additional refill, please advise

## 2015-09-06 ENCOUNTER — Ambulatory Visit (INDEPENDENT_AMBULATORY_CARE_PROVIDER_SITE_OTHER): Payer: 59 | Admitting: Family Medicine

## 2015-09-06 ENCOUNTER — Encounter: Payer: Self-pay | Admitting: Family Medicine

## 2015-09-06 VITALS — BP 110/64 | HR 69 | Temp 98.0°F | Ht 62.0 in | Wt 163.5 lb

## 2015-09-06 DIAGNOSIS — M546 Pain in thoracic spine: Secondary | ICD-10-CM | POA: Diagnosis not present

## 2015-09-06 LAB — POCT URINALYSIS DIPSTICK
Bilirubin, UA: NEGATIVE
GLUCOSE UA: NEGATIVE
Ketones, UA: NEGATIVE
Leukocytes, UA: NEGATIVE
Nitrite, UA: NEGATIVE
RBC UA: NEGATIVE
SPEC GRAV UA: 1.025
UROBILINOGEN UA: 0.2
pH, UA: 6.5

## 2015-09-06 MED ORDER — ESOMEPRAZOLE MAGNESIUM 20 MG PO CPDR: 20.0000 mg | DELAYED_RELEASE_CAPSULE | Freq: Two times a day (BID) | ORAL | Status: DC

## 2015-09-06 MED ORDER — TRAMADOL HCL 50 MG PO TABS: 50.0000 mg | ORAL_TABLET | Freq: Four times a day (QID) | ORAL | Status: DC | PRN

## 2015-09-06 NOTE — Progress Notes (Signed)
Subjective:    Patient ID: Mindy Long, female    DOB: 06-05-1961, 54 y.o.   MRN: [id number]  HPI Here with back pain   On Monday - upper back pain / both sides  Now settled into her L side (flank area)   So bad Tuesday she could not get out of bed (worse with movement)  She was worried about shingles but did not get a rash   No pain with urination  Urinated more frequently lately  No blood in urine  No odor   No hx of kidney stones   Using heating pad - taking tramadol and gabapentin-improved   Has seen Dr Nelva Bush for low back pain in the past - not mid back pain  ESI in the past   Patient Active Problem List   Diagnosis Date Noted  . Thoracic back pain 09/06/2015  . Tinnitus 11/13/2014  . Shingles 04/27/2014  . Back pain 04/20/2014  . OSA (obstructive sleep apnea) 09/27/2012  . Frequent urination 08/12/2012  . Snoring 06/21/2012  . Fatigue 06/20/2012  . BPPV (benign paroxysmal positional vertigo) 12/14/2011  . Colon cancer screening 11/04/2011  . Routine general medical examination at a health care facility 10/22/2011  . Foot pain 09/11/2011  . BACK PAIN 09/05/2010  . PLANTAR FASCIITIS 09/05/2010  . Vitamin D deficiency 07/02/2010  . DISTURBANCE OF SKIN SENSATION 04/07/2010  . PALPITATIONS 04/07/2010  . HYPOKALEMIA 07/10/2009  . PERIMENOPAUSAL STATUS 06/20/2009  . ALLERGIC RHINITIS 04/03/2008  . Essential hypertension 01/25/2008  . G E R D 07/05/2007  . PEPTIC ULCER DISEASE 07/05/2007  . Headache, chronic daily 07/05/2007  . NEPHROLITHIASIS, HX OF 07/05/2007   Past Medical History  Diagnosis Date  . Headache(784.0)   . GERD (gastroesophageal reflux disease)   . Plantar fasciitis   . History of nephrolithiasis   . Hypertension   . Back pain   . Ulcer 1980's    peptic ulcer disease  . Sleep apnea    Past Surgical History  Procedure Laterality Date  . Cholecystectomy  1984  . Abdominal hysterectomy  2002  . Oophorectomy    . Colonoscopy   12/18/2011    Normal   Social History  Substance Use Topics  . Smoking status: Never Smoker   . Smokeless tobacco: Never Used  . Alcohol Use: No   Family History  Problem Relation Age of Onset  . Stroke Father   . Hypertension Father   . Diabetes Father   . Cancer Brother     stomach tumor   Allergies  Allergen Reactions  . Atenolol Cough  . Hydrocodone Nausea Only   Current Outpatient Prescriptions on File Prior to Visit  Medication Sig Dispense Refill  . amLODipine (NORVASC) 5 MG tablet TAKE 1 TABLET (5 MG TOTAL) BY MOUTH DAILY. 90 tablet 3  . celecoxib (CELEBREX) 200 MG capsule Take 1 capsule (200 mg total) by mouth daily as needed. 90 capsule 3  . Cholecalciferol (VITAMIN D3) 2000 UNITS capsule Take 2,000 Units by mouth daily.      Marland Kitchen doxycycline (VIBRAMYCIN) 100 MG capsule Take 1 capsule (100 mg total) by mouth 2 (two) times daily. 20 capsule 0  . gabapentin (NEURONTIN) 300 MG capsule Take 300 mg by mouth 3 (three) times daily.     No current facility-administered medications on file prior to visit.      Review of Systems Review of Systems  Constitutional: Negative for fever, appetite change, fatigue and unexpected weight change.  Eyes: Negative  for pain and visual disturbance.  Respiratory: Negative for cough and shortness of breath.   Cardiovascular: Negative for cp or palpitations    Gastrointestinal: Negative for nausea, diarrhea and constipation.  Genitourinary: Negative for urgency and frequency.  Skin: Negative for pallor or rash   MSK neg for acute joint changes/ pos for chronic low back pain  Neurological: Negative for weakness, light-headedness, numbness and headaches.  Hematological: Negative for adenopathy. Does not bruise/bleed easily.  Psychiatric/Behavioral: Negative for dysphoric mood. The patient is not nervous/anxious.         Objective:   Physical Exam  Constitutional: She appears well-developed and well-nourished. No distress.  obese and  well appearing   HENT:  Head: Normocephalic and atraumatic.  Eyes: Conjunctivae and EOM are normal. Pupils are equal, round, and reactive to light. No scleral icterus.  Neck: Normal range of motion. Neck supple.  Cardiovascular: Normal rate and regular rhythm.   Pulmonary/Chest: Effort normal and breath sounds normal.  Abdominal:  No suprapubic tenderness or fullness   No cva tenderness   Musculoskeletal: She exhibits tenderness. She exhibits no edema.  Tender in L thoracic musculature Nl rom of TS Spasm noted No neuro changes  Nl rom neck   Lymphadenopathy:    She has no cervical adenopathy.  Neurological: She is alert. She has normal reflexes. No cranial nerve deficit. She exhibits normal muscle tone. Coordination normal.  Skin: Skin is warm and dry. No rash noted. No erythema. No pallor.  Psychiatric: She has a normal mood and affect.          Assessment & Plan:   Problem List Items Addressed This Visit      Other   Thoracic back pain - Primary    On the L for a week but starting to improve  Gait has been off due to chronic low back pain (sees Dr Nelva Bush)- hx of deg lumbar disease  Given info on back stretches Disc use of heat and walking as well  Continue celebrex Tramadol as needed  Gabapentin without change   Update if not starting to improve in a week or if worsening   Would consider addnl eval and PT if no improvement        Relevant Medications   traMADol (ULTRAM) 50 MG tablet   Other Relevant Orders   POCT urinalysis dipstick (Completed)

## 2015-09-06 NOTE — Patient Instructions (Signed)
Use heat on your back 10 minutes at a time Get back to walking  Stretching is good as tolerated Give a urine specimen on the way out   I think you had a thoracic muscle spasm - perhaps from "walking funny" or "sleeping funny" from the shoulder pain the week before   Continue tramadol as needed  Gabapentin celebrex   Update if not starting to improve in a week or if worsening

## 2015-09-06 NOTE — Progress Notes (Signed)
Pre visit review using our clinic review tool, if applicable. No additional management support is needed unless otherwise documented below in the visit note. 

## 2015-09-08 NOTE — Assessment & Plan Note (Signed)
On the L for a week but starting to improve  Gait has been off due to chronic low back pain (sees Dr Nelva Bush)- hx of deg lumbar disease  Given info on back stretches Disc use of heat and walking as well  Continue celebrex Tramadol as needed  Gabapentin without change   Update if not starting to improve in a week or if worsening   Would consider addnl eval and PT if no improvement

## 2015-12-31 ENCOUNTER — Other Ambulatory Visit: Payer: Self-pay

## 2015-12-31 DIAGNOSIS — Z1231 Encounter for screening mammogram for malignant neoplasm of breast: Secondary | ICD-10-CM

## 2016-01-22 ENCOUNTER — Ambulatory Visit
Admission: RE | Admit: 2016-01-22 | Discharge: 2016-01-22 | Disposition: A | Discharge status: Home / Self Care | Payer: 59 | Source: Ambulatory Visit

## 2016-01-22 DIAGNOSIS — Z1231 Encounter for screening mammogram for malignant neoplasm of breast: Secondary | ICD-10-CM

## 2016-01-22 LAB — HM MAMMOGRAPHY: HM Mammogram: NORMAL

## 2016-01-28 ENCOUNTER — Encounter: Payer: Self-pay | Admitting: *Deleted

## 2016-01-28 ENCOUNTER — Encounter: Payer: Self-pay | Admitting: Family Medicine

## 2016-06-10 ENCOUNTER — Other Ambulatory Visit: Payer: Self-pay

## 2016-06-10 MED ORDER — TRAMADOL HCL 50 MG PO TABS: 50.0000 mg | ORAL_TABLET | Freq: Four times a day (QID) | ORAL | Status: DC | PRN

## 2016-06-10 NOTE — Telephone Encounter (Signed)
Px written for call in   

## 2016-06-10 NOTE — Telephone Encounter (Signed)
Called in to [facility] on [address].

## 2016-06-10 NOTE — Telephone Encounter (Signed)
[facility] left v/m requesting refill tramadol. Pt last seen and rx last refilled # 30 x 1 on 09/06/15. Please advise.

## 2016-07-09 ENCOUNTER — Other Ambulatory Visit: Payer: Self-pay | Admitting: Family Medicine

## 2016-07-19 ENCOUNTER — Telehealth: Payer: Self-pay | Admitting: Family Medicine

## 2016-07-19 DIAGNOSIS — E559 Vitamin D deficiency, unspecified: Secondary | ICD-10-CM

## 2016-07-19 DIAGNOSIS — Z0000 Encounter for general adult medical examination without abnormal findings: Secondary | ICD-10-CM

## 2016-07-19 NOTE — Telephone Encounter (Signed)
-----   Message from Marchia Bond sent at 07/15/2016  3:37 PM EDT ----- Regarding: [facility] labs Tues 8/15, need orders. Thanks! :-) Please order  future cpx labs for pt's upcoming lab appt. Thanks Aniceto Boss

## 2016-07-21 ENCOUNTER — Other Ambulatory Visit (INDEPENDENT_AMBULATORY_CARE_PROVIDER_SITE_OTHER): Payer: 59

## 2016-07-21 DIAGNOSIS — E559 Vitamin D deficiency, unspecified: Secondary | ICD-10-CM | POA: Diagnosis not present

## 2016-07-21 DIAGNOSIS — Z0000 Encounter for general adult medical examination without abnormal findings: Secondary | ICD-10-CM | POA: Diagnosis not present

## 2016-07-21 LAB — CBC WITH DIFFERENTIAL/PLATELET
BASOS ABS: 0 10*3/uL (ref 0.0–0.1)
Basophils Relative: 0.5 % (ref 0.0–3.0)
EOS ABS: 0.2 10*3/uL (ref 0.0–0.7)
Eosinophils Relative: 3.2 % (ref 0.0–5.0)
HCT: 39.8 % (ref 36.0–46.0)
Hemoglobin: 12.9 g/dL (ref 12.0–15.0)
LYMPHS ABS: 2.7 10*3/uL (ref 0.7–4.0)
Lymphocytes Relative: 47.4 % — ABNORMAL HIGH (ref 12.0–46.0)
MCHC: 32.5 g/dL (ref 30.0–36.0)
MCV: 76.9 fl — AB (ref 78.0–100.0)
MONO ABS: 0.4 10*3/uL (ref 0.1–1.0)
MONOS PCT: 6.4 % (ref 3.0–12.0)
NEUTROS ABS: 2.4 10*3/uL (ref 1.4–7.7)
NEUTROS PCT: 42.5 % — AB (ref 43.0–77.0)
PLATELETS: 176 10*3/uL (ref 150.0–400.0)
RBC: 5.18 Mil/uL — AB (ref 3.87–5.11)
RDW: 14.2 % (ref 11.5–15.5)
WBC: 5.8 10*3/uL (ref 4.0–10.5)

## 2016-07-21 LAB — COMPREHENSIVE METABOLIC PANEL
ALT: 15 U/L (ref 0–35)
AST: 19 U/L (ref 0–37)
Albumin: 3.9 g/dL (ref 3.5–5.2)
Alkaline Phosphatase: 81 U/L (ref 39–117)
BUN: 10 mg/dL (ref 6–23)
CHLORIDE: 106 meq/L (ref 96–112)
CO2: 23 meq/L (ref 19–32)
CREATININE: 0.83 mg/dL (ref 0.40–1.20)
Calcium: 9.3 mg/dL (ref 8.4–10.5)
GFR: 91.07 mL/min (ref >60.00)
Glucose, Bld: 88 mg/dL (ref 70–99)
POTASSIUM: 3.5 meq/L (ref 3.5–5.1)
SODIUM: 139 meq/L (ref 135–145)
TOTAL PROTEIN: 7 g/dL (ref 6.0–8.3)
Total Bilirubin: 0.4 mg/dL (ref 0.2–1.2)

## 2016-07-21 LAB — LIPID PANEL
CHOL/HDL RATIO: 2
CHOLESTEROL: 210 mg/dL — AB (ref 0–200)
HDL: 87.6 mg/dL (ref >39.00)
LDL CALC: 115 mg/dL — AB (ref 0–99)
NonHDL: 122.45
TRIGLYCERIDES: 39 mg/dL (ref 0.0–149.0)
VLDL: 7.8 mg/dL (ref 0.0–40.0)

## 2016-07-21 LAB — VITAMIN D 25 HYDROXY (VIT D DEFICIENCY, FRACTURES): VITD: 26.59 ng/mL — AB (ref 30.00–100.00)

## 2016-07-21 LAB — TSH: TSH: 2.68 u[IU]/mL (ref 0.35–4.50)

## 2016-07-28 ENCOUNTER — Ambulatory Visit (INDEPENDENT_AMBULATORY_CARE_PROVIDER_SITE_OTHER): Payer: 59 | Admitting: Family Medicine

## 2016-07-28 ENCOUNTER — Encounter: Payer: Self-pay | Admitting: Family Medicine

## 2016-07-28 VITALS — BP 110/78 | HR 98 | Temp 98.4°F | Ht 62.0 in | Wt 167.2 lb

## 2016-07-28 DIAGNOSIS — H9311 Tinnitus, right ear: Secondary | ICD-10-CM | POA: Diagnosis not present

## 2016-07-28 DIAGNOSIS — Z1159 Encounter for screening for other viral diseases: Secondary | ICD-10-CM

## 2016-07-28 DIAGNOSIS — Z0000 Encounter for general adult medical examination without abnormal findings: Secondary | ICD-10-CM | POA: Diagnosis not present

## 2016-07-28 DIAGNOSIS — E559 Vitamin D deficiency, unspecified: Secondary | ICD-10-CM | POA: Diagnosis not present

## 2016-07-28 DIAGNOSIS — I10 Essential (primary) hypertension: Secondary | ICD-10-CM | POA: Diagnosis not present

## 2016-07-28 DIAGNOSIS — Z114 Encounter for screening for human immunodeficiency virus [HIV]: Secondary | ICD-10-CM | POA: Insufficient documentation

## 2016-07-28 MED ORDER — CELECOXIB 200 MG PO CAPS: 200.0000 mg | ORAL_CAPSULE | Freq: Every day | ORAL | 3 refills | Status: DC | PRN

## 2016-07-28 MED ORDER — AMLODIPINE BESYLATE 5 MG PO TABS: ORAL_TABLET | ORAL | 3 refills | Status: DC

## 2016-07-28 NOTE — Assessment & Plan Note (Signed)
- 

## 2016-07-28 NOTE — Assessment & Plan Note (Signed)
bp in fair control at this time  BP Readings from Last 1 Encounters:  07/28/16 110/78   No changes needed Disc lifstyle change with low sodium diet and exercise  Labs reviewed

## 2016-07-28 NOTE — Assessment & Plan Note (Signed)
Level still low Urged to take 2000 iu every day instead of several times weekly Disc imp to bone and overall health

## 2016-07-28 NOTE — Progress Notes (Signed)
Pre visit review using our clinic review tool, if applicable. No additional management support is needed unless otherwise documented below in the visit note. 

## 2016-07-28 NOTE — Progress Notes (Signed)
Subjective:    Patient ID: Mindy Long, female    DOB: [birth year], 55 y.o.   MRN: [id number]  HPI Here for health maintenance exam and to review chronic medical problems    Summer was ok   Doing ok except for more flare ups for her feet  Needs a refill of her celebrex- does not bother her stomach   Wt Readings from Last 3 Encounters:  07/28/16 167 lb 4 oz (75.9 kg)  09/06/15 163 lb 8 oz (74.2 kg)  05/17/15 162 lb (73.5 kg)  just came off vacation - not her usual diet Plans to get better -healthy diet Walks for exercise = would consider exercise bike  bmi is 30.5  Hep C screen/HIV screen Is interested in that today   Pap test-had a hysterectomy in the past for fibroids  No gyn problems   Flu shots - gets yearly   Still having ear problems  Saw ENT for tinnitus and her ears and R ear pain  Would like to see someone in [place]  No drainage  No allergy symptoms   Mammogram 2/17-normal  Self breast exam -no lumps or changes   Tetanus shot 7/10  Colonoscopy 11/13-normal with 10 year recall   Vit D level 26.5 She is taking vit D otc (when she remembers) - about 4 times out of the week 2000 iu  No hx of broken bones  She does exercise    bp is stable today  No cp or palpitations or headaches or edema  No side effects to medicines  BP Readings from Last 3 Encounters:  07/28/16 110/78  09/06/15 110/64  05/17/15 118/78      Results for orders placed or performed in visit on 07/21/16  CBC with Differential/Platelet  Result Value Ref Range   WBC 5.8 4.0 - 10.5 K/uL   RBC 5.18 (H) 3.87 - 5.11 Mil/uL   Hemoglobin 12.9 12.0 - 15.0 g/dL   HCT 39.8 36.0 - 46.0 %   MCV 76.9 (L) 78.0 - 100.0 fl   MCHC 32.5 30.0 - 36.0 g/dL   RDW 14.2 11.5 - 15.5 %   Platelets 176.0 150.0 - 400.0 K/uL   Neutrophils Relative % 42.5 (L) 43.0 - 77.0 %   Lymphocytes Relative 47.4 (H) 12.0 - 46.0 %   Monocytes Relative 6.4 3.0 - 12.0 %   Eosinophils Relative 3.2 0.0 - 5.0 %   Basophils  Relative 0.5 0.0 - 3.0 %   Neutro Abs 2.4 1.4 - 7.7 K/uL   Lymphs Abs 2.7 0.7 - 4.0 K/uL   Monocytes Absolute 0.4 0.1 - 1.0 K/uL   Eosinophils Absolute 0.2 0.0 - 0.7 K/uL   Basophils Absolute 0.0 0.0 - 0.1 K/uL  Comprehensive metabolic panel  Result Value Ref Range   Sodium 139 135 - 145 mEq/L   Potassium 3.5 3.5 - 5.1 mEq/L   Chloride 106 96 - 112 mEq/L   CO2 23 19 - 32 mEq/L   Glucose, Bld 88 70 - 99 mg/dL   BUN 10 6 - 23 mg/dL   Creatinine, Ser 0.83 0.40 - 1.20 mg/dL   Total Bilirubin 0.4 0.2 - 1.2 mg/dL   Alkaline Phosphatase 81 39 - 117 U/L   AST 19 0 - 37 U/L   ALT 15 0 - 35 U/L   Total Protein 7.0 6.0 - 8.3 g/dL   Albumin 3.9 3.5 - 5.2 g/dL   Calcium 9.3 8.4 - 10.5 mg/dL   GFR 91.07 >60.00 mL/min  Lipid panel  Result Value Ref Range   Cholesterol 210 (H) 0 - 200 mg/dL   Triglycerides 39.0 0.0 - 149.0 mg/dL   HDL 87.60 >39.00 mg/dL   VLDL 7.8 0.0 - 40.0 mg/dL   LDL Cholesterol 115 (H) 0 - 99 mg/dL   Total CHOL/HDL Ratio 2    NonHDL 122.45   TSH  Result Value Ref Range   TSH 2.68 0.35 - 4.50 uIU/mL  VITAMIN D 25 Hydroxy (Vit-D Deficiency, Fractures)  Result Value Ref Range   VITD 26.59 (L) 30.00 - 100.00 ng/mL    Labs look good including cholesterol and blood glucose    Patient Active Problem List   Diagnosis Date Noted  . Need for hepatitis C screening test 07/28/2016  . Encounter for screening for HIV 07/28/2016  . Thoracic back pain 09/06/2015  . Tinnitus 11/13/2014  . History of shingles 04/27/2014  . Back pain 04/20/2014  . OSA (obstructive sleep apnea) 09/27/2012  . Snoring 06/21/2012  . Fatigue 06/20/2012  . BPPV (benign paroxysmal positional vertigo) 12/14/2011  . Colon cancer screening 11/04/2011  . Routine general medical examination at a health care facility 10/22/2011  . Foot pain 09/11/2011  . BACK PAIN 09/05/2010  . PLANTAR FASCIITIS 09/05/2010  . Vitamin D deficiency 07/02/2010  . DISTURBANCE OF SKIN SENSATION 04/07/2010  . PALPITATIONS  04/07/2010  . HYPOKALEMIA 07/10/2009  . PERIMENOPAUSAL STATUS 06/20/2009  . ALLERGIC RHINITIS 04/03/2008  . Essential hypertension 01/25/2008  . G E R D 07/05/2007  . PEPTIC ULCER DISEASE 07/05/2007  . Headache, chronic daily 07/05/2007  . NEPHROLITHIASIS, HX OF 07/05/2007   Past Medical History:  Diagnosis Date  . Back pain   . GERD (gastroesophageal reflux disease)   . Headache(784.0)   . History of nephrolithiasis   . Hypertension   . Plantar fasciitis   . Sleep apnea   . Ulcer 1980's   peptic ulcer disease   Past Surgical History:  Procedure Laterality Date  . ABDOMINAL HYSTERECTOMY  2002  . CHOLECYSTECTOMY  1984  . COLONOSCOPY  12/18/2011   Normal  . OOPHORECTOMY     Social History  Substance Use Topics  . Smoking status: Never Smoker  . Smokeless tobacco: Never Used  . Alcohol use No   Family History  Problem Relation Age of Onset  . Stroke Father   . Hypertension Father   . Diabetes Father   . Cancer Brother     stomach tumor   Allergies  Allergen Reactions  . Atenolol Cough  . Hydrocodone Nausea Only   Current Outpatient Prescriptions on File Prior to Visit  Medication Sig Dispense Refill  . Cholecalciferol (VITAMIN D3) 2000 UNITS capsule Take 2,000 Units by mouth daily.      Marland Kitchen esomeprazole (NEXIUM) 20 MG capsule Take 1 capsule (20 mg total) by mouth 2 (two) times daily before a meal. (Patient taking differently: Take 20 mg by mouth 2 (two) times daily as needed. ) 180 capsule 3  . gabapentin (NEURONTIN) 300 MG capsule Take 300 mg by mouth 3 (three) times daily.    . traMADol (ULTRAM) 50 MG tablet Take 1-2 tablets (50-100 mg total) by mouth every 6 (six) hours as needed. 30 tablet 1   No current facility-administered medications on file prior to visit.      Review of Systems Review of Systems  Constitutional: Negative for fever, appetite change, fatigue and unexpected weight change.  Eyes: Negative for pain and visual disturbance.  Respiratory:  Negative for cough  and shortness of breath.   Cardiovascular: Negative for cp or palpitations    Gastrointestinal: Negative for nausea, diarrhea and constipation.  Genitourinary: Negative for urgency and frequency.  Skin: Negative for pallor or rash   MSK pos for chronic foot and back pain  Neurological: Negative for weakness, light-headedness, numbness and headaches.  Hematological: Negative for adenopathy. Does not bruise/bleed easily.  Psychiatric/Behavioral: Negative for dysphoric mood. The patient is not nervous/anxious.         Objective:   Physical Exam  Constitutional: She appears well-developed and well-nourished. No distress.  overwt and well app  HENT:  Head: Normocephalic and atraumatic.  Right Ear: External ear normal.  Left Ear: External ear normal.  Mouth/Throat: Oropharynx is clear and moist.  Nl app TMs bilat   Eyes: Conjunctivae and EOM are normal. Pupils are equal, round, and reactive to light. No scleral icterus.  Neck: Normal range of motion. Neck supple. No JVD present. Carotid bruit is not present. No thyromegaly present.  Cardiovascular: Normal rate, regular rhythm, normal heart sounds and intact distal pulses.  Exam reveals no gallop.   Pulmonary/Chest: Effort normal and breath sounds normal. No respiratory distress. She has no wheezes. She exhibits no tenderness.  Abdominal: Soft. Bowel sounds are normal. She exhibits no distension, no abdominal bruit and no mass. There is no tenderness.  Genitourinary: No breast swelling, tenderness, discharge or bleeding.  Genitourinary Comments: Breast exam: No mass, nodules, thickening, tenderness, bulging, retraction, inflamation, nipple discharge or skin changes noted.  No axillary or clavicular LA.      Musculoskeletal: Normal range of motion. She exhibits no edema or tenderness.  Lymphadenopathy:    She has no cervical adenopathy.  Neurological: She is alert. She has normal reflexes. No cranial nerve deficit. She  exhibits normal muscle tone. Coordination normal.  Skin: Skin is warm and dry. No rash noted. No erythema. No pallor.  Psychiatric: She has a normal mood and affect.          Assessment & Plan:   Problem List Items Addressed This Visit      Cardiovascular and Mediastinum   Essential hypertension    bp in fair control at this time  BP Readings from Last 1 Encounters:  07/28/16 110/78   No changes needed Disc lifstyle change with low sodium diet and exercise  Labs reviewed       Relevant Medications   amLODipine (NORVASC) 5 MG tablet     Other   Vitamin D deficiency - Primary    Level still low Urged to take 2000 iu every day instead of several times weekly Disc imp to bone and overall health      Tinnitus    R ear -with pain Not improving Ref to ENT       Relevant Orders   Ambulatory referral to ENT   Routine general medical examination at a health care facility    Reviewed health habits including diet and exercise and skin cancer prevention Reviewed appropriate screening tests for age  Also reviewed health mt list, fam hx and immunization status , as well as social and family history   See HPI  Labs reviewed  When you check out -stop up front for ENT referral  For exercise - break up your workouts and consider a bike or exercise bike  Also - learn to swim in the future  Labs on the way out for Hep C and HIV screen  Get a flu shot this fall wherever convenient  Your vitamin D level is low - take your vitamin D3 2000 iu every day - put in your pill box with your other medicines  Keep taking good care of yourself       Need for hepatitis C screening test    Hep C screening today      Relevant Orders   Hepatitis C antibody   Encounter for screening for HIV    HIV screen today      Relevant Orders   HIV antibody (with reflex)    Other Visit Diagnoses   None.

## 2016-07-28 NOTE — Assessment & Plan Note (Signed)
Hep C screening today 

## 2016-07-28 NOTE — Assessment & Plan Note (Signed)
R ear -with pain Not improving Ref to ENT

## 2016-07-28 NOTE — Patient Instructions (Addendum)
When you check out -stop up front for ENT referral  For exercise - break up your workouts and consider a bike or exercise bike  Also - learn to swim in the future  Labs on the way out for Hep C and HIV screen  Get a flu shot this fall wherever convenient  Your vitamin D level is low - take your vitamin D3 2000 iu every day - put in your pill box with your other medicines  Keep taking good care of yourself

## 2016-07-28 NOTE — Assessment & Plan Note (Signed)
Reviewed health habits including diet and exercise and skin cancer prevention Reviewed appropriate screening tests for age  Also reviewed health mt list, fam hx and immunization status , as well as social and family history   See HPI  Labs reviewed  When you check out -stop up front for ENT referral  For exercise - break up your workouts and consider a bike or exercise bike  Also - learn to swim in the future  Labs on the way out for Hep C and HIV screen  Get a flu shot this fall wherever convenient  Your vitamin D level is low - take your vitamin D3 2000 iu every day - put in your pill box with your other medicines  Keep taking good care of yourself

## 2016-07-29 LAB — HIV ANTIBODY (ROUTINE TESTING W REFLEX): HIV 1&2 Ab, 4th Generation: NONREACTIVE

## 2016-07-31 ENCOUNTER — Telehealth: Payer: Self-pay | Admitting: Family Medicine

## 2016-07-31 NOTE — Telephone Encounter (Signed)
Patient returned Shapale's call. °

## 2016-07-31 NOTE — Telephone Encounter (Signed)
Addressed through result notes  

## 2016-08-14 ENCOUNTER — Other Ambulatory Visit: Payer: Self-pay | Admitting: Otolaryngology

## 2016-08-14 DIAGNOSIS — H93A1 Pulsatile tinnitus, right ear: Secondary | ICD-10-CM

## 2016-08-21 ENCOUNTER — Ambulatory Visit
Admission: RE | Admit: 2016-08-21 | Discharge: 2016-08-21 | Disposition: A | Discharge status: Home / Self Care | Payer: 59 | Source: Ambulatory Visit | Attending: Otolaryngology | Admitting: Otolaryngology

## 2016-08-21 DIAGNOSIS — H93A1 Pulsatile tinnitus, right ear: Secondary | ICD-10-CM

## 2016-09-16 ENCOUNTER — Other Ambulatory Visit: Payer: Self-pay | Admitting: Otolaryngology

## 2016-09-16 DIAGNOSIS — H93A9 Pulsatile tinnitus, unspecified ear: Secondary | ICD-10-CM

## 2016-09-17 ENCOUNTER — Other Ambulatory Visit: Payer: Self-pay | Admitting: Otolaryngology

## 2016-09-21 ENCOUNTER — Ambulatory Visit
Admission: RE | Admit: 2016-09-21 | Discharge: 2016-09-21 | Disposition: A | Discharge status: Home / Self Care | Payer: 59 | Source: Ambulatory Visit | Attending: Otolaryngology | Admitting: Otolaryngology

## 2016-09-21 DIAGNOSIS — H93A9 Pulsatile tinnitus, unspecified ear: Secondary | ICD-10-CM

## 2016-09-21 MED ORDER — GADOBENATE DIMEGLUMINE 529 MG/ML IV SOLN
17.0000 mL | Freq: Once | INTRAVENOUS | Status: AC | PRN
  Administered 2016-09-21: 17 mL via INTRAVENOUS

## 2016-09-22 ENCOUNTER — Other Ambulatory Visit: Payer: Self-pay | Admitting: Otolaryngology

## 2016-09-22 DIAGNOSIS — E041 Nontoxic single thyroid nodule: Secondary | ICD-10-CM

## 2016-09-23 ENCOUNTER — Ambulatory Visit (INDEPENDENT_AMBULATORY_CARE_PROVIDER_SITE_OTHER): Payer: 59 | Admitting: Family Medicine

## 2016-09-23 ENCOUNTER — Telehealth: Payer: Self-pay | Admitting: Family Medicine

## 2016-09-23 ENCOUNTER — Encounter: Payer: Self-pay | Admitting: Family Medicine

## 2016-09-23 VITALS — BP 126/72 | HR 111 | Temp 98.8°F | Ht 62.0 in | Wt 168.8 lb

## 2016-09-23 DIAGNOSIS — Z23 Encounter for immunization: Secondary | ICD-10-CM | POA: Diagnosis not present

## 2016-09-23 DIAGNOSIS — R058 Other specified cough: Secondary | ICD-10-CM

## 2016-09-23 DIAGNOSIS — R05 Cough: Secondary | ICD-10-CM

## 2016-09-23 MED ORDER — TRAMADOL HCL 50 MG PO TABS: 50.0000 mg | ORAL_TABLET | Freq: Four times a day (QID) | ORAL | 1 refills | Status: DC | PRN

## 2016-09-23 MED ORDER — BENZONATATE 200 MG PO CAPS: 200.0000 mg | ORAL_CAPSULE | Freq: Three times a day (TID) | ORAL | 1 refills | Status: DC | PRN

## 2016-09-23 NOTE — Telephone Encounter (Signed)
Pt had appt with Dr Glori Bickers today at 3:45.

## 2016-09-23 NOTE — Telephone Encounter (Signed)
Patient Name: Mindy Long DOB: 26-Feb-1961 Initial Comment Caller states has had a cold x 3 weeks, still has a cough Nurse Assessment Nurse: Vallery Sa, RN, Cathy Date/Time (Eastern Time): 09/23/2016 2:31:38 PM Confirm and document reason for call. If symptomatic, describe symptoms. You must click the next button to save text entered. ---Mindy Long states she has had a non-productive cough for the past 3 weeks. No severe breathing difficulty. No wheezing. No fever. Alert and responsive. Has the patient traveled out of the country within the last 30 days? ---No Does the patient have any new or worsening symptoms? ---Yes Will a triage be completed? ---Yes Related visit to physician within the last 2 weeks? ---No Does the PT have any chronic conditions? (i.e. diabetes, asthma, etc.) ---Yes List chronic conditions. ---High Blood Pressure Is this a behavioral health or substance abuse call? ---No Guidelines Guideline Title Affirmed Question Affirmed Notes Cough - Acute Non-Productive Cough present > 10 days Final Disposition User See PCP When Office is Open (within 3 days) Vallery Sa, RN, Tye [place] Comments Scheduled appointment for 3:45pm today with Dr. Glori Bickers. Referrals REFERRED TO PCP OFFICE Disagree/Comply: Comply

## 2016-09-23 NOTE — Progress Notes (Signed)
Subjective:    Patient ID: Mindy Long, female    DOB: October 30, 1961, 55 y.o.   MRN: [id number]  HPI Here with uri symptoms   Started with a bad cold  Never had a fever   Now bad cough remains - cannot stop coughing  Thinks she has pnd  Continuously coughs all day and clears her throat  Spits out clear drainage  No sob or wheezing  Throat is not sore -just irritated   No facial pain  Had a headache a few days ago   She also has GERD symptoms- brought on by lemonade  Still on nexium - once daily   She needs a refill on tramadol- is down to 2 pills   Was taking mucinex liquid DM -did not help very much   Is getting a thyroid ultrasound later this week   Patient Active Problem List   Diagnosis Date Noted  . Post-viral cough syndrome 09/23/2016  . Need for hepatitis C screening test 07/28/2016  . Encounter for screening for HIV 07/28/2016  . Thoracic back pain 09/06/2015  . Tinnitus 11/13/2014  . History of shingles 04/27/2014  . Back pain 04/20/2014  . OSA (obstructive sleep apnea) 09/27/2012  . Snoring 06/21/2012  . Fatigue 06/20/2012  . BPPV (benign paroxysmal positional vertigo) 12/14/2011  . Colon cancer screening 11/04/2011  . Routine general medical examination at a health care facility 10/22/2011  . Foot pain 09/11/2011  . BACK PAIN 09/05/2010  . PLANTAR FASCIITIS 09/05/2010  . Vitamin D deficiency 07/02/2010  . DISTURBANCE OF SKIN SENSATION 04/07/2010  . PALPITATIONS 04/07/2010  . HYPOKALEMIA 07/10/2009  . PERIMENOPAUSAL STATUS 06/20/2009  . ALLERGIC RHINITIS 04/03/2008  . Essential hypertension 01/25/2008  . G E R D 07/05/2007  . PEPTIC ULCER DISEASE 07/05/2007  . Headache, chronic daily 07/05/2007  . NEPHROLITHIASIS, HX OF 07/05/2007   Past Medical History:  Diagnosis Date  . Back pain   . GERD (gastroesophageal reflux disease)   . Headache(784.0)   . History of nephrolithiasis   . Hypertension   . Plantar fasciitis   . Sleep apnea   . Ulcer  ([place]) 1980's   peptic ulcer disease   Past Surgical History:  Procedure Laterality Date  . ABDOMINAL HYSTERECTOMY  2002  . CHOLECYSTECTOMY  1984  . COLONOSCOPY  12/18/2011   Normal  . OOPHORECTOMY     Social History  Substance Use Topics  . Smoking status: Never Smoker  . Smokeless tobacco: Never Used  . Alcohol use No   Family History  Problem Relation Age of Onset  . Stroke Father   . Hypertension Father   . Diabetes Father   . Cancer Brother     stomach tumor   Allergies  Allergen Reactions  . Atenolol Cough  . Hydrocodone Nausea Only   Current Outpatient Prescriptions on File Prior to Visit  Medication Sig Dispense Refill  . amLODipine (NORVASC) 5 MG tablet TAKE 1 TABLET (5 MG TOTAL) BY MOUTH DAILY. 90 tablet 3  . celecoxib (CELEBREX) 200 MG capsule Take 1 capsule (200 mg total) by mouth daily as needed. 90 capsule 3  . Cholecalciferol (VITAMIN D3) 2000 UNITS capsule Take 2,000 Units by mouth daily.      Marland Kitchen esomeprazole (NEXIUM) 20 MG capsule Take 1 capsule (20 mg total) by mouth 2 (two) times daily before a meal. (Patient taking differently: Take 20 mg by mouth 2 (two) times daily as needed. ) 180 capsule 3  . gabapentin (NEURONTIN) 300 MG  capsule Take 300 mg by mouth 3 (three) times daily.     No current facility-administered medications on file prior to visit.     Review of Systems Review of Systems  Constitutional: Negative for fever, appetite change, fatigue and unexpected weight change.  Eyes: Negative for pain and visual disturbance.  ENT pos for pnd and throat clearing/ globus sens Respiratory: Negative for wheeze and shortness of breath.   Cardiovascular: Negative for cp or palpitations    Gastrointestinal: Negative for nausea, diarrhea and constipation. neg for heartburn (improved) and abd pain  Genitourinary: Negative for urgency and frequency.  Skin: Negative for pallor or rash   Neurological: Negative for weakness, light-headedness, numbness and  headaches.  Hematological: Negative for adenopathy. Does not bruise/bleed easily.  Psychiatric/Behavioral: Negative for dysphoric mood. The patient is not nervous/anxious.         Objective:   Physical Exam  Constitutional: She appears well-developed and well-nourished. No distress.  Well appearing   HENT:  Head: Normocephalic and atraumatic.  Right Ear: External ear normal.  Left Ear: External ear normal.  Mouth/Throat: Oropharynx is clear and moist.  Nares are boggy  No sinus tenderness Clear rhinorrhea and post nasal drip -mild     Eyes: Conjunctivae and EOM are normal. Pupils are equal, round, and reactive to light. Right eye exhibits no discharge. Left eye exhibits no discharge.  Neck: Normal range of motion. Neck supple.  Cardiovascular: Normal rate and normal heart sounds.   Pulmonary/Chest: Effort normal and breath sounds normal. No respiratory distress. She has no wheezes. She has no rales. She exhibits no tenderness.  Abdominal: Soft. Bowel sounds are normal. She exhibits no distension. There is no tenderness.  Lymphadenopathy:    She has no cervical adenopathy.  Neurological: She is alert.  Skin: Skin is warm and dry. No rash noted.  Psychiatric: She has a normal mood and affect.          Assessment & Plan:   Problem List Items Addressed This Visit      Other   Post-viral cough syndrome    Multifactorial cough - suspect pt has post viral cyclic cough and also some cough related to GERD Will stay on nexium/watch diet  Trial of claritin for pnd  Tessalon for cough  She also has tramadol that she uses prn for pain-this was refilled  and may also help cough  Update if not starting to improve in a week or if worsening   Reassuring exam       Other Visit Diagnoses    Need for influenza vaccination    -  Primary   Relevant Orders   Flu Vaccine QUAD 36+ mos IM (Completed)

## 2016-09-23 NOTE — Telephone Encounter (Signed)
Pt was seen in the office.

## 2016-09-23 NOTE — Progress Notes (Signed)
Pre visit review using our clinic review tool, if applicable. No additional management support is needed unless otherwise documented below in the visit note. 

## 2016-09-23 NOTE — Patient Instructions (Signed)
Try an oral antihistamine for post nasal drip like claritin 10 mg daily Tessalon for cough- one pill three times daily  Tramadol for more severe cough as needed (watch out for sedation) Continue your nexium-because reflux can add to this also   Update if not starting to improve in a week or if worsening

## 2016-09-24 NOTE — Assessment & Plan Note (Signed)
Multifactorial cough - suspect pt has post viral cyclic cough and also some cough related to GERD Will stay on nexium/watch diet  Trial of claritin for pnd  Tessalon for cough  She also has tramadol that she uses prn for pain-this was refilled  and may also help cough  Update if not starting to improve in a week or if worsening   Reassuring exam

## 2016-10-01 ENCOUNTER — Ambulatory Visit
Admission: RE | Admit: 2016-10-01 | Discharge: 2016-10-01 | Disposition: A | Discharge status: Home / Self Care | Payer: 59 | Source: Ambulatory Visit | Attending: Otolaryngology | Admitting: Otolaryngology

## 2016-10-01 DIAGNOSIS — E041 Nontoxic single thyroid nodule: Secondary | ICD-10-CM

## 2016-11-20 ENCOUNTER — Telehealth: Payer: Self-pay | Admitting: Family Medicine

## 2016-11-20 NOTE — Telephone Encounter (Signed)
Cannot treat w/o eval Please try to get [facility] into [facility] if she cannot be seen today  I recommend regular warm compresses and keep it clean as well  If worse/ fever - go to ED if after hours

## 2016-11-20 NOTE — Telephone Encounter (Signed)
Patient Name: ABBIGAYL Osborn  DOB: Dec 23, 1960    Initial Comment Caller states she has painful rectal cyst    Nurse Assessment  Nurse: Raphael Gibney, RN, Vanita Ingles Date/Time (Eastern Time): 11/20/2016 10:22:05 AM  Confirm and document reason for call. If symptomatic, describe symptoms. ---Caller states she has a painful rectal cyst. Has had cyst before.  Does the patient have any new or worsening symptoms? ---Yes  Will a triage be completed? ---Yes  Related visit to physician within the last 2 weeks? ---No  Does the PT have any chronic conditions? (i.e. diabetes, asthma, etc.) ---Yes  List chronic conditions. ---HTN  Is this a behavioral health or substance abuse call? ---No     Guidelines    Guideline Title Affirmed Question Affirmed Notes  Skin Lump or Localized Swelling [1] Swelling is painful to touch AND [2] no fever    Final Disposition User   See Physician within 24 Hours [place], Therapist, sports, [place]    Comments  No appts available at [facility]. Pt does not want to go to urgent care. Wants to know if medication can be called in. Please call pt back regarding medication.   Referrals  GO TO FACILITY REFUSED   Disagree/Comply: Disagree  Disagree/Comply Reason: Disagree with instructions

## 2016-11-20 NOTE — Telephone Encounter (Signed)
appt scheduled at [facility] and pt aware

## 2016-11-21 ENCOUNTER — Ambulatory Visit (INDEPENDENT_AMBULATORY_CARE_PROVIDER_SITE_OTHER): Payer: 59 | Admitting: Family

## 2016-11-21 VITALS — BP 110/78 | HR 106 | Temp 98.4°F | Resp 18 | Ht 62.0 in | Wt 167.8 lb

## 2016-11-21 DIAGNOSIS — L0291 Cutaneous abscess, unspecified: Secondary | ICD-10-CM

## 2016-11-21 MED ORDER — CEPHALEXIN 250 MG/5ML PO SUSR: 500.0000 mg | Freq: Three times a day (TID) | ORAL | 0 refills | Status: DC

## 2016-11-21 NOTE — Progress Notes (Signed)
Pre-visit discussion using our clinic review tool. No additional management support is needed unless otherwise documented below in the visit note.  

## 2016-11-21 NOTE — Progress Notes (Signed)
Subjective:    Patient ID: Mindy Long, female    DOB: [birth year], 55 y.o.   MRN: [id number]  HPI  Mindy Long is a 55 yr old female who presents today to discuss a cyst on her right buttock. Reports that she first noticed the cyst 3 days ago.  Area is tender.    Review of Systems See HPI  Past Medical History:  Diagnosis Date  . Back pain   . GERD (gastroesophageal reflux disease)   . Headache(784.0)   . History of nephrolithiasis   . Hypertension   . Plantar fasciitis   . Sleep apnea   . Ulcer ([place]) 1980's   peptic ulcer disease     Social History   Social History  . Marital status: Married    Spouse name: N/A  . Number of children: N/A  . Years of education: N/A   Occupational History  . ar specialist    Social History Main Topics  . Smoking status: Never Smoker  . Smokeless tobacco: Never Used  . Alcohol use No  . Drug use: No  . Sexual activity: Not on file   Other Topics Concern  . Not on file   Social History Narrative   Walks for exercise    Past Surgical History:  Procedure Laterality Date  . ABDOMINAL HYSTERECTOMY  2002  . CHOLECYSTECTOMY  1984  . COLONOSCOPY  12/18/2011   Normal  . OOPHORECTOMY      Family History  Problem Relation Age of Onset  . Stroke Father   . Hypertension Father   . Diabetes Father   . Cancer Brother     stomach tumor    Allergies  Allergen Reactions  . Atenolol Cough  . Hydrocodone Nausea Only    Current Outpatient Prescriptions on File Prior to Visit  Medication Sig Dispense Refill  . amLODipine (NORVASC) 5 MG tablet TAKE 1 TABLET (5 MG TOTAL) BY MOUTH DAILY. 90 tablet 3  . benzonatate (TESSALON) 200 MG capsule Take 1 capsule (200 mg total) by mouth 3 (three) times daily as needed for cough. Do not bite or chew pill / swallow it whole 30 capsule 1  . celecoxib (CELEBREX) 200 MG capsule Take 1 capsule (200 mg total) by mouth daily as needed. 90 capsule 3  . Cholecalciferol (VITAMIN D3) 2000 UNITS  capsule Take 2,000 Units by mouth daily.      Marland Kitchen esomeprazole (NEXIUM) 20 MG capsule Take 1 capsule (20 mg total) by mouth 2 (two) times daily before a meal. (Patient taking differently: Take 20 mg by mouth 2 (two) times daily as needed. ) 180 capsule 3  . gabapentin (NEURONTIN) 300 MG capsule Take 300 mg by mouth 3 (three) times daily.    . traMADol (ULTRAM) 50 MG tablet Take 1-2 tablets (50-100 mg total) by mouth every 6 (six) hours as needed. 30 tablet 1   No current facility-administered medications on file prior to visit.     BP 110/78   Pulse (!) 106   Temp 98.4 F (36.9 C) (Oral)   Resp 18   Ht 5\' 2"  (1.575 m)   Wt 167 lb 12 oz (76.1 kg)   SpO2 96%   BMI 30.68 kg/m       Objective:   Physical Exam  Constitutional: She is oriented to person, place, and time. She appears well-developed and well-nourished. No distress.  Neurological: She is alert and oriented to person, place, and time.  Skin: Skin is warm and dry.  Tender quarter sized area of induration noted right medial buttock adjacent to gluteal fold. Some purulent drainage expressed  Psychiatric: She has a normal mood and affect. Her behavior is normal. Judgment and thought content normal.          Assessment & Plan:  Superficial abscess- rx with keflex, requests liquid. Apply warm compresses bid or hot bath. Pt is advised as follows:  Call if area become more swollen, painful, if you develop surrounding redness, or if you develop fever.  Call if boil is not resolved in 1 week.

## 2016-11-21 NOTE — Patient Instructions (Signed)
Please begin keflex 3 times daily (antibiotic) and continue for 7 days. Apply warm compresses or take a warm tub twice daily. Call if area become more swollen, painful, if you develop surrounding redness, or if you develop fever.  Call if boil is not resolved in 1 week.

## 2016-11-25 ENCOUNTER — Telehealth: Payer: Self-pay

## 2016-11-25 MED ORDER — DOXYCYCLINE HYCLATE 100 MG PO TABS
100.0000 mg | ORAL_TABLET | Freq: Two times a day (BID) | ORAL | 0 refills | Status: DC
Start: 2016-11-25 — End: 2017-08-04

## 2016-11-25 NOTE — Telephone Encounter (Signed)
No available appts today at [facility] or [place] and offered pt appt at another [facility] site but too far to go. Pt scheduled appt with Dr Lorelei Pont 11/26/16 at 2 pm (only time that fit pts schedule). If pt condition changes or worsens prior to appt pt will cb or go to [facility]. FYI to Dr Lorelei Pont.

## 2016-11-25 NOTE — Telephone Encounter (Signed)
Pt notified Rx sent and advise of Dr. Marliss Coots instructions and verbalized understanding. Pt cancelled appt with Dr. Lorelei Pont and she will update [place] later this week

## 2016-11-25 NOTE — Telephone Encounter (Signed)
Spoke to pt and she said this is her 1st time taking this abx so it could be a side eff of the abx. Pt said if Dr. Glori Bickers wants to change her Rx she is okay with that, [facility]. Pt said that the abscess is doing better, it's smaller in size and not as red, it's not as painful and it's not as itchy as it was before. Pt said she still can feel a lil knot in the middle of it but it's smaller that it was before. Pt as taken abx this morning already so when should she start the new abx and also should she keep her appt tomorrow with Dr. Lorelei Pont?

## 2016-11-25 NOTE — Telephone Encounter (Signed)
It looks like Dr. Glori Bickers has appointments today - I will send to her in case she she wants to see her herself. O/w I will recheck tomorrow.

## 2016-11-25 NOTE — Telephone Encounter (Signed)
If she thinks it is just a side effect from the abx-we can change it -please ask her.  How is her abscess?

## 2016-11-25 NOTE — Telephone Encounter (Signed)
PLEASE NOTE: All timestamps contained within this report are represented as [place] Standard Time. CONFIDENTIALTY NOTICE: This fax transmission is intended only for the addressee. It contains information that is legally privileged, confidential or otherwise protected from use or disclosure. If you are not the intended recipient, you are strictly prohibited from reviewing, disclosing, copying using or disseminating any of this information or taking any action in reliance on or regarding this information. If you have received this fax in error, please notify us immediately by telephone so that we can arrange for its return to [place]. Phone: [phone], Toll-Free: [phone], Fax: [phone] Page: 1 of 2 Call Id: [id number] [place] Patient Name: Mindy Long Gender: Female DOB: Nov 20, 1961 Age: 55 Y 30 M 3 D Return Phone Number: [phone] (Primary) Address: City/State/Zip: [place] [address] Client [facility] Night - Client Client Site [place] Physician [place], Roque Lias - MD Contact Type Call Who Is Calling Patient / Member / Family / Caregiver Call Type Triage / Clinical Relationship To Patient Self Return Phone Number [phone] (Primary) Chief Complaint Medication reaction Reason for Call Symptomatic / Request for [place] states she has been queasy and and lightheaded.. Started when she started taking Cephalexain 250 mg suspension. PreDisposition InappropriateToAsk Translation No Nurse Assessment Nurse: Cherre Robins, RN, Ria Comment Date/Time (Eastern Time): 11/24/2016 5:20:52 PM Confirm and document reason for call. If symptomatic, describe symptoms. ---Caller states she has been feeling dizzy "room started spinning" on Sunday after she started an antibiotic on Saturday and nausea on Monday. Caller  states she was rx'd cephalexin for boil on "her backside". Callers denies vomiting. States she has had a slight headache that is coming and going. Caller states no headache at present, but 30 minutes ago was very dizzy. Does the patient have any new or worsening symptoms? ---Yes Will a triage be completed? ---Yes Related visit to physician within the last 2 weeks? ---Yes Does the PT have any chronic conditions? (i.e. diabetes, asthma, etc.) ---Yes List chronic conditions. ---HTN- amlodipine 5mg - has been taking for at least 5 years. Is this a behavioral health or substance abuse call? ---No Guidelines Guideline Title Affirmed Question Affirmed Notes Nurse Date/Time (Eastern Time) Dizziness - Vertigo [1] MODERATE dizziness (e.g., vertigo; feels very unsteady, interferes with normal activities) AND [2] has NOT been evaluated by physician for this Weiss-Hilton, RN, Ria Comment 11/24/2016 5:28:29 PM PLEASE NOTE: All timestamps contained within this report are represented as [place] Standard Time. CONFIDENTIALTY NOTICE: This fax transmission is intended only for the addressee. It contains information that is legally privileged, confidential or otherwise protected from use or disclosure. If you are not the intended recipient, you are strictly prohibited from reviewing, disclosing, copying using or disseminating any of this information or taking any action in reliance on or regarding this information. If you have received this fax in error, please notify us immediately by telephone so that we can arrange for its return to [place]. Phone: [phone], Toll-Free: [phone], Fax: [phone] Page: 2 of 2 Call Id: [id number] [place]. Time Eilene Ghazi Time) Disposition Final User 11/24/2016 5:36:31 PM See Physician within 24 Hours Yes Weiss-Hilton, RN, Ivonne Andrew Understands: Yes Disagree/Comply: Comply Care Advice Given Per Guideline SEE PHYSICIAN WITHIN 24 HOURS: * IF OFFICE WILL BE OPEN: You need  to be seen within the next 24 hours. Call your doctor when the office opens, and make an appointment. BRING  MEDICINES: * Please bring a list of your current medicines when you go to see the doctor. * It is also a good idea to bring the pill bottles too. This will help the doctor to make certain you are taking the right medicines and the right dose. CALL BACK IF: * Severe headache occurs * Weakness develops in an arm or leg * Unable to walk without falling * You become worse. CARE ADVICE given per Dizziness - Vertigo (Adult) guideline. Referrals REFERRED TO PCP OFFICE

## 2016-11-25 NOTE — Telephone Encounter (Signed)
Stop the keflex  Start doxycycline tonight ([facility]) take with non dairy food bit   She can likely cancel with Dr Lorelei Pont for tomorrow    Update me if no improvement  I will be in the office Friday

## 2016-11-26 ENCOUNTER — Ambulatory Visit: Payer: 59 | Admitting: Family Medicine

## 2016-12-17 ENCOUNTER — Other Ambulatory Visit: Payer: Self-pay | Admitting: Family Medicine

## 2016-12-17 DIAGNOSIS — Z1231 Encounter for screening mammogram for malignant neoplasm of breast: Secondary | ICD-10-CM

## 2017-01-22 ENCOUNTER — Ambulatory Visit
Admission: RE | Admit: 2017-01-22 | Discharge: 2017-01-22 | Disposition: A | Discharge status: Home / Self Care | Payer: 59 | Source: Ambulatory Visit | Attending: Family Medicine | Admitting: Family Medicine

## 2017-01-22 DIAGNOSIS — Z1231 Encounter for screening mammogram for malignant neoplasm of breast: Secondary | ICD-10-CM

## 2017-01-22 LAB — HM MAMMOGRAPHY

## 2017-01-26 ENCOUNTER — Encounter: Payer: Self-pay | Admitting: *Deleted

## 2017-02-12 ENCOUNTER — Other Ambulatory Visit: Payer: Self-pay | Admitting: Family Medicine

## 2017-02-16 NOTE — Telephone Encounter (Signed)
Px written for call in   

## 2017-02-16 NOTE — Telephone Encounter (Signed)
Rx called in as prescribed 

## 2017-02-16 NOTE — Telephone Encounter (Signed)
Pt has CPE on 07/28/16 and a few acute appts after that, last filled on 09/23/16 #30 tabs with 1 additional refills, please advise

## 2017-05-20 ENCOUNTER — Other Ambulatory Visit: Payer: Self-pay | Admitting: Otolaryngology

## 2017-05-20 DIAGNOSIS — E041 Nontoxic single thyroid nodule: Secondary | ICD-10-CM

## 2017-07-26 ENCOUNTER — Telehealth: Payer: Self-pay | Admitting: Family Medicine

## 2017-07-26 DIAGNOSIS — E559 Vitamin D deficiency, unspecified: Secondary | ICD-10-CM

## 2017-07-26 DIAGNOSIS — I10 Essential (primary) hypertension: Secondary | ICD-10-CM

## 2017-07-26 DIAGNOSIS — Z0000 Encounter for general adult medical examination without abnormal findings: Secondary | ICD-10-CM

## 2017-07-26 NOTE — Telephone Encounter (Signed)
-----   Message from Marchia Bond sent at 07/15/2017  3:00 PM EDT ----- Regarding: Cpx labs Tues 8/21, need orders. Thanks :-) Please order  future cpx labs for pt's upcoming lab appt. PT states her opthalmologic recommends she have a1c checked. Thanks Aniceto Boss

## 2017-07-27 ENCOUNTER — Other Ambulatory Visit (INDEPENDENT_AMBULATORY_CARE_PROVIDER_SITE_OTHER): Payer: 59

## 2017-07-27 DIAGNOSIS — Z1159 Encounter for screening for other viral diseases: Secondary | ICD-10-CM

## 2017-07-27 DIAGNOSIS — I10 Essential (primary) hypertension: Secondary | ICD-10-CM

## 2017-07-27 DIAGNOSIS — E559 Vitamin D deficiency, unspecified: Secondary | ICD-10-CM

## 2017-07-27 LAB — CBC WITH DIFFERENTIAL/PLATELET
BASOS ABS: 0 10*3/uL (ref 0.0–0.1)
Basophils Relative: 0.2 % (ref 0.0–3.0)
EOS PCT: 2.6 % (ref 0.0–5.0)
Eosinophils Absolute: 0.1 10*3/uL (ref 0.0–0.7)
HEMATOCRIT: 42.9 % (ref 36.0–46.0)
HEMOGLOBIN: 13.6 g/dL (ref 12.0–15.0)
Lymphocytes Relative: 42.7 % (ref 12.0–46.0)
Lymphs Abs: 1.8 10*3/uL (ref 0.7–4.0)
MCHC: 31.8 g/dL (ref 30.0–36.0)
MCV: 78.5 fl (ref 78.0–100.0)
MONOS PCT: 5.4 % (ref 3.0–12.0)
Monocytes Absolute: 0.2 10*3/uL (ref 0.1–1.0)
NEUTROS PCT: 49.1 % (ref 43.0–77.0)
Neutro Abs: 2.1 10*3/uL (ref 1.4–7.7)
Platelets: 249 10*3/uL (ref 150.0–400.0)
RBC: 5.46 Mil/uL — AB (ref 3.87–5.11)
RDW: 14 % (ref 11.5–15.5)
WBC: 4.3 10*3/uL (ref 4.0–10.5)

## 2017-07-27 LAB — COMPREHENSIVE METABOLIC PANEL
ALT: 15 U/L (ref 0–35)
AST: 18 U/L (ref 0–37)
Albumin: 3.8 g/dL (ref 3.5–5.2)
Alkaline Phosphatase: 79 U/L (ref 39–117)
BUN: 10 mg/dL (ref 6–23)
CHLORIDE: 105 meq/L (ref 96–112)
CO2: 29 meq/L (ref 19–32)
CREATININE: 0.81 mg/dL (ref 0.40–1.20)
Calcium: 9.4 mg/dL (ref 8.4–10.5)
GFR: 93.34 mL/min (ref >60.00)
Glucose, Bld: 88 mg/dL (ref 70–99)
Potassium: 3.6 mEq/L (ref 3.5–5.1)
Sodium: 139 mEq/L (ref 135–145)
Total Bilirubin: 0.5 mg/dL (ref 0.2–1.2)
Total Protein: 7.6 g/dL (ref 6.0–8.3)

## 2017-07-27 LAB — LIPID PANEL
CHOL/HDL RATIO: 2
Cholesterol: 194 mg/dL (ref 0–200)
HDL: 84.3 mg/dL (ref >39.00)
LDL CALC: 97 mg/dL (ref 0–99)
NONHDL: 109.89
TRIGLYCERIDES: 63 mg/dL (ref 0.0–149.0)
VLDL: 12.6 mg/dL (ref 0.0–40.0)

## 2017-07-27 LAB — TSH: TSH: 1.32 u[IU]/mL (ref 0.35–4.50)

## 2017-07-27 LAB — VITAMIN D 25 HYDROXY (VIT D DEFICIENCY, FRACTURES): VITD: 31.37 ng/mL (ref 30.00–100.00)

## 2017-07-28 LAB — HEPATITIS C ANTIBODY: HCV Ab: NONREACTIVE

## 2017-08-03 ENCOUNTER — Other Ambulatory Visit: Payer: Self-pay | Admitting: Family Medicine

## 2017-08-04 ENCOUNTER — Ambulatory Visit (INDEPENDENT_AMBULATORY_CARE_PROVIDER_SITE_OTHER): Payer: 59 | Admitting: Family Medicine

## 2017-08-04 ENCOUNTER — Encounter: Payer: Self-pay | Admitting: Family Medicine

## 2017-08-04 VITALS — BP 116/64 | HR 86 | Temp 98.2°F | Ht 62.0 in | Wt 168.2 lb

## 2017-08-04 DIAGNOSIS — I10 Essential (primary) hypertension: Secondary | ICD-10-CM | POA: Diagnosis not present

## 2017-08-04 DIAGNOSIS — Z0000 Encounter for general adult medical examination without abnormal findings: Secondary | ICD-10-CM

## 2017-08-04 DIAGNOSIS — M5440 Lumbago with sciatica, unspecified side: Secondary | ICD-10-CM | POA: Diagnosis not present

## 2017-08-04 DIAGNOSIS — R938 Abnormal findings on diagnostic imaging of other specified body structures: Secondary | ICD-10-CM | POA: Diagnosis not present

## 2017-08-04 DIAGNOSIS — M79673 Pain in unspecified foot: Secondary | ICD-10-CM

## 2017-08-04 DIAGNOSIS — E559 Vitamin D deficiency, unspecified: Secondary | ICD-10-CM

## 2017-08-04 DIAGNOSIS — G8929 Other chronic pain: Secondary | ICD-10-CM | POA: Diagnosis not present

## 2017-08-04 DIAGNOSIS — H579 Unspecified disorder of eye and adnexa: Secondary | ICD-10-CM | POA: Insufficient documentation

## 2017-08-04 DIAGNOSIS — H9311 Tinnitus, right ear: Secondary | ICD-10-CM

## 2017-08-04 MED ORDER — TRAMADOL HCL 50 MG PO TABS: 50.0000 mg | ORAL_TABLET | Freq: Four times a day (QID) | ORAL | 1 refills | Status: DC | PRN

## 2017-08-04 NOTE — Progress Notes (Signed)
Subjective:    Patient ID: Mindy Long, female    DOB: 06/07/1961, 56 y.o.   MRN: [id number]  HPI Here for health maintenance exam and to review chronic medical problems   Having more flare ups with her back and feet  Takes gabapentin and tramadol - for neuropathy and plantar fasciitis and arthritis  Helps her sleep  celebrex also  Orthotics do help some   Still having ear problem - tinnitus / can't do anything about it  ENT wants to watch her carotid artery yearly for pulsatile tinnitus    Wt Readings from Last 3 Encounters:  08/04/17 168 lb 4 oz (76.3 kg)  11/21/16 167 lb 12 oz (76.1 kg)  09/23/16 168 lb 12 oz (76.5 kg)  wt is stable  Diet - trying to eat healthy  Not getting as much exercise (had to slow down her walking) -interested in a bike / interested in swim lessons  30.77 kg/m   Her eye doctor saw abn on her retina  - worried about diabetes  Her sugar is 88  Interested in A1C   Pap 6/09 Had a hysterectomy and oophrectomy No gyn symptoms   Flu shot - will get in the fall   Mammogram 2/18 normal Self breast exam - no lumps   Td 7/10  Colonoscopy 1/13 Recall 10 y 1/23  Vit D 31.3  She takes vitamin D 2000 iu daily   Hep C screen neg   bp is stable today  No cp or palpitations or headaches or edema  No side effects to medicines  BP Readings from Last 3 Encounters:  08/04/17 116/64  11/21/16 110/78  09/23/16 126/72    Amlodipine   Results for orders placed or performed in visit on 07/27/17  CBC with Differential/Platelet  Result Value Ref Range   WBC 4.3 4.0 - 10.5 K/uL   RBC 5.46 (H) 3.87 - 5.11 Mil/uL   Hemoglobin 13.6 12.0 - 15.0 g/dL   HCT 42.9 36.0 - 46.0 %   MCV 78.5 78.0 - 100.0 fl   MCHC 31.8 30.0 - 36.0 g/dL   RDW 14.0 11.5 - 15.5 %   Platelets 249.0 150.0 - 400.0 K/uL   Neutrophils Relative % 49.1 43.0 - 77.0 %   Lymphocytes Relative 42.7 12.0 - 46.0 %   Monocytes Relative 5.4 3.0 - 12.0 %   Eosinophils Relative 2.6 0.0 -  5.0 %   Basophils Relative 0.2 0.0 - 3.0 %   Neutro Abs 2.1 1.4 - 7.7 K/uL   Lymphs Abs 1.8 0.7 - 4.0 K/uL   Monocytes Absolute 0.2 0.1 - 1.0 K/uL   Eosinophils Absolute 0.1 0.0 - 0.7 K/uL   Basophils Absolute 0.0 0.0 - 0.1 K/uL  Comprehensive metabolic panel  Result Value Ref Range   Sodium 139 135 - 145 mEq/L   Potassium 3.6 3.5 - 5.1 mEq/L   Chloride 105 96 - 112 mEq/L   CO2 29 19 - 32 mEq/L   Glucose, Bld 88 70 - 99 mg/dL   BUN 10 6 - 23 mg/dL   Creatinine, Ser 0.81 0.40 - 1.20 mg/dL   Total Bilirubin 0.5 0.2 - 1.2 mg/dL   Alkaline Phosphatase 79 39 - 117 U/L   AST 18 0 - 37 U/L   ALT 15 0 - 35 U/L   Total Protein 7.6 6.0 - 8.3 g/dL   Albumin 3.8 3.5 - 5.2 g/dL   Calcium 9.4 8.4 - 10.5 mg/dL   GFR 93.34 >60.00  mL/min  Lipid panel  Result Value Ref Range   Cholesterol 194 0 - 200 mg/dL   Triglycerides 63.0 0.0 - 149.0 mg/dL   HDL 84.30 >39.00 mg/dL   VLDL 12.6 0.0 - 40.0 mg/dL   LDL Cholesterol 97 0 - 99 mg/dL   Total CHOL/HDL Ratio 2    NonHDL 109.89   TSH  Result Value Ref Range   TSH 1.32 0.35 - 4.50 uIU/mL  VITAMIN D 25 Hydroxy (Vit-D Deficiency, Fractures)  Result Value Ref Range   VITD 31.37 30.00 - 100.00 ng/mL  Hepatitis C antibody  Result Value Ref Range   HCV Ab NON-REACTIVE NON-REACTIVE    Patient Active Problem List   Diagnosis Date Noted  . Eye exam abnormal 08/04/2017  . Thoracic back pain 09/06/2015  . Tinnitus 11/13/2014  . History of shingles 04/27/2014  . Back pain 04/20/2014  . OSA (obstructive sleep apnea) 09/27/2012  . Snoring 06/21/2012  . Fatigue 06/20/2012  . BPPV (benign paroxysmal positional vertigo) 12/14/2011  . Colon cancer screening 11/04/2011  . Routine general medical examination at a health care facility 10/22/2011  . Foot pain 09/11/2011  . BACK PAIN 09/05/2010  . PLANTAR FASCIITIS 09/05/2010  . Vitamin D deficiency 07/02/2010  . DISTURBANCE OF SKIN SENSATION 04/07/2010  . PALPITATIONS 04/07/2010  . HYPOKALEMIA  07/10/2009  . PERIMENOPAUSAL STATUS 06/20/2009  . ALLERGIC RHINITIS 04/03/2008  . Essential hypertension 01/25/2008  . G E R D 07/05/2007  . PEPTIC ULCER DISEASE 07/05/2007  . Headache, chronic daily 07/05/2007  . NEPHROLITHIASIS, HX OF 07/05/2007   Past Medical History:  Diagnosis Date  . Back pain   . GERD (gastroesophageal reflux disease)   . Headache(784.0)   . History of nephrolithiasis   . Hypertension   . Plantar fasciitis   . Sleep apnea   . Ulcer 1980's   peptic ulcer disease   Past Surgical History:  Procedure Laterality Date  . ABDOMINAL HYSTERECTOMY  2002  . CHOLECYSTECTOMY  1984  . COLONOSCOPY  12/18/2011   Normal  . OOPHORECTOMY     Social History  Substance Use Topics  . Smoking status: Never Smoker  . Smokeless tobacco: Never Used  . Alcohol use No   Family History  Problem Relation Age of Onset  . Stroke Father   . Hypertension Father   . Diabetes Father   . Cancer Brother        stomach tumor   Allergies  Allergen Reactions  . Atenolol Cough  . Hydrocodone Nausea Only   Current Outpatient Prescriptions on File Prior to Visit  Medication Sig Dispense Refill  . benzonatate (TESSALON) 200 MG capsule Take 1 capsule (200 mg total) by mouth 3 (three) times daily as needed for cough. Do not bite or chew pill / swallow it whole 30 capsule 1  . celecoxib (CELEBREX) 200 MG capsule Take 1 capsule (200 mg total) by mouth daily as needed. 90 capsule 3  . Cholecalciferol (VITAMIN D3) 2000 UNITS capsule Take 2,000 Units by mouth daily.      Marland Kitchen esomeprazole (NEXIUM) 20 MG capsule Take 1 capsule (20 mg total) by mouth 2 (two) times daily before a meal. (Patient taking differently: Take 20 mg by mouth 2 (two) times daily as needed. ) 180 capsule 3  . gabapentin (NEURONTIN) 300 MG capsule Take 300 mg by mouth 3 (three) times daily.    Marland Kitchen amLODipine (NORVASC) 5 MG tablet TAKE 1 TABLET (5 MG TOTAL) BY MOUTH DAILY. 90 tablet 3  No current facility-administered  medications on file prior to visit.      Review of Systems    Review of Systems  Constitutional: Negative for fever, appetite change, fatigue and unexpected weight change.  Eyes: Negative for pain and visual disturbance.  ENT pos for R chronic pulsatile tinnitus  Respiratory: Negative for cough and shortness of breath.   Cardiovascular: Negative for cp or palpitations    Gastrointestinal: Negative for nausea, diarrhea and constipation.  Genitourinary: Negative for urgency and frequency.  Skin: Negative for pallor or rash   MSK pos for chronic back and foot pain  Neurological: Negative for weakness, light-headedness, numbness and headaches. pos for neuropathy symptoms in feet  Hematological: Negative for adenopathy. Does not bruise/bleed easily.  Psychiatric/Behavioral: Negative for dysphoric mood. The patient is not nervous/anxious.      Objective:   Physical Exam  Constitutional: She appears well-developed and well-nourished. No distress.  obese and well appearing   HENT:  Head: Normocephalic and atraumatic.  Right Ear: External ear normal.  Left Ear: External ear normal.  Mouth/Throat: Oropharynx is clear and moist.  Eyes: Pupils are equal, round, and reactive to light. Conjunctivae and EOM are normal. No scleral icterus.  Neck: Normal range of motion. Neck supple. No JVD present. Carotid bruit is not present. No thyromegaly present.  Cardiovascular: Normal rate, regular rhythm, normal heart sounds and intact distal pulses.  Exam reveals no gallop.   Pulmonary/Chest: Effort normal and breath sounds normal. No respiratory distress. She has no wheezes. She exhibits no tenderness.  Abdominal: Soft. Bowel sounds are normal. She exhibits no distension, no abdominal bruit and no mass. There is no tenderness.  Genitourinary: No breast swelling, tenderness, discharge or bleeding.  Genitourinary Comments: Breast exam: No mass, nodules, thickening, tenderness, bulging, retraction,  inflamation, nipple discharge or skin changes noted.  No axillary or clavicular LA.      Musculoskeletal: Normal range of motion. She exhibits no edema or tenderness.  Limited rom of LS   No kyphosis     Lymphadenopathy:    She has no cervical adenopathy.  Neurological: She is alert. She has normal reflexes. No cranial nerve deficit. She exhibits normal muscle tone. Coordination normal.  Hypersensitivity of feet  Skin: Skin is warm and dry. No rash noted. No erythema. No pallor.  Some skin tags  Psychiatric: She has a normal mood and affect.  Pleasant           Assessment & Plan:   Problem List Items Addressed This Visit      Cardiovascular and Mediastinum   Essential hypertension - Primary    bp in fair control at this time  BP Readings from Last 1 Encounters:  08/04/17 116/64   No changes needed Disc lifstyle change with low sodium diet and exercise  Labs reviewed         Other   Back pain    Chronic  Takes gabapentin and tramadol and celebrex for this and foot pain       Relevant Medications   traMADol (ULTRAM) 50 MG tablet   Chronic foot pain    With neuropathy symptoms  On gabapentin/tramadol and celebrex      Relevant Medications   traMADol (ULTRAM) 50 MG tablet   Eye exam abnormal    ? Abn retinal finding  Will check A1C at future draw Nl glucose level HTN is well controlled       Routine general medical examination at a health care facility  Reviewed health habits including diet and exercise and skin cancer prevention Reviewed appropriate screening tests for age  Also reviewed health mt list, fam hx and immunization status , as well as social and family history   See HPI Labs reviewed  Will plan an A1C check at pt's req due to a reported abn retinal exam from her eye doctor  She will get a flu shot in the fall  Mammogram due 2/18 Hep C screen neg Disc use of bike or water exercise for activity         Tinnitus    Pt continues to  have R pulsatile tinnitus in R ear Has seen ENT Watching carotids  This is bothersome  Gabapentin helps sleep      Vitamin D deficiency    Vitamin D level is therapeutic with current supplementation Disc importance of this to bone and overall health Level in 30s

## 2017-08-04 NOTE — Patient Instructions (Addendum)
Don't forget to get a flu shot in the fall   Don't forget to schedule your mammogram in the fall   Schedule non fasting lab for A1C when convenient   Take care of yourself  Investigate a recumbent exercise bike for exercise  Also water exercise

## 2017-08-05 NOTE — Assessment & Plan Note (Signed)
Chronic  Takes gabapentin and tramadol and celebrex for this and foot pain

## 2017-08-05 NOTE — Assessment & Plan Note (Signed)
Reviewed health habits including diet and exercise and skin cancer prevention Reviewed appropriate screening tests for age  Also reviewed health mt list, fam hx and immunization status , as well as social and family history   See HPI Labs reviewed  Will plan an A1C check at pt's req due to a reported abn retinal exam from her eye doctor  She will get a flu shot in the fall  Mammogram due 2/18 Hep C screen neg Disc use of bike or water exercise for activity

## 2017-08-05 NOTE — Assessment & Plan Note (Signed)
bp in fair control at this time  BP Readings from Last 1 Encounters:  08/04/17 116/64   No changes needed Disc lifstyle change with low sodium diet and exercise  Labs reviewed

## 2017-08-05 NOTE — Assessment & Plan Note (Signed)
Vitamin D level is therapeutic with current supplementation Disc importance of this to bone and overall health Level in 30s

## 2017-08-05 NOTE — Assessment & Plan Note (Signed)
With neuropathy symptoms  On gabapentin/tramadol and celebrex

## 2017-08-05 NOTE — Assessment & Plan Note (Signed)
Pt continues to have R pulsatile tinnitus in R ear Has seen ENT Watching carotids  This is bothersome  Gabapentin helps sleep

## 2017-08-05 NOTE — Assessment & Plan Note (Signed)
?   Abn retinal finding  Will check A1C at future draw Nl glucose level HTN is well controlled

## 2017-08-18 ENCOUNTER — Other Ambulatory Visit (INDEPENDENT_AMBULATORY_CARE_PROVIDER_SITE_OTHER): Payer: 59

## 2017-08-18 DIAGNOSIS — R938 Abnormal findings on diagnostic imaging of other specified body structures: Secondary | ICD-10-CM | POA: Diagnosis not present

## 2017-08-18 DIAGNOSIS — H579 Unspecified disorder of eye and adnexa: Secondary | ICD-10-CM

## 2017-08-18 LAB — HEMOGLOBIN A1C: Hgb A1c MFr Bld: 5.9 % (ref 4.6–6.5)

## 2017-10-06 ENCOUNTER — Encounter: Payer: Self-pay | Admitting: Family Medicine

## 2017-10-06 ENCOUNTER — Ambulatory Visit (INDEPENDENT_AMBULATORY_CARE_PROVIDER_SITE_OTHER): Payer: 59 | Admitting: Family Medicine

## 2017-10-06 VITALS — BP 130/90 | HR 114 | Temp 99.0°F | Ht 62.0 in | Wt 174.2 lb

## 2017-10-06 DIAGNOSIS — J208 Acute bronchitis due to other specified organisms: Secondary | ICD-10-CM

## 2017-10-06 DIAGNOSIS — J209 Acute bronchitis, unspecified: Secondary | ICD-10-CM

## 2017-10-06 MED ORDER — AZITHROMYCIN 250 MG PO TABS: ORAL_TABLET | ORAL | 0 refills | Status: DC

## 2017-10-06 NOTE — Progress Notes (Signed)
Dr. Frederico Hamman T. Amyrah Pinkhasov, MD, [facility] [place], [address] Phone: [phone] Fax: [phone]  10/06/2017  Patient: Mindy Long, MRN: [id number], DOB: 21-Apr-1961, 56 y.o.  Primary Physician:  Tower, Wynelle Fanny, MD   Chief Complaint  Patient presents with  . Cough    Started this past Friday   . Nasal Congestion   Subjective:   Mindy Long is a 56 y.o. very pleasant female patient who presents with the following:  Patient presents for presents evaluation of myalgias, nasal congestion, productive cough, rhinorrhea , sneezing and sore throat. Symptoms began about 1 week ago and are gradually worsening since that time.    Risk Factors: no pulm disease  The patient denies significant nausea, vomitting, diarrhea, rash, diffuse arthralgia or myalgia. They also deny high fever.   Past Medical History, Surgical History, Social History, Family History, Problem List, Medications, and Allergies have been reviewed and updated if relevant.  Patient Active Problem List   Diagnosis Date Noted  . Eye exam abnormal 08/04/2017  . Thoracic back pain 09/06/2015  . Tinnitus 11/13/2014  . History of shingles 04/27/2014  . Back pain 04/20/2014  . OSA (obstructive sleep apnea) 09/27/2012  . Snoring 06/21/2012  . Fatigue 06/20/2012  . BPPV (benign paroxysmal positional vertigo) 12/14/2011  . Colon cancer screening 11/04/2011  . Routine general medical examination at a health care facility 10/22/2011  . Chronic foot pain 09/11/2011  . PLANTAR FASCIITIS 09/05/2010  . Vitamin D deficiency 07/02/2010  . DISTURBANCE OF SKIN SENSATION 04/07/2010  . PALPITATIONS 04/07/2010  . HYPOKALEMIA 07/10/2009  . PERIMENOPAUSAL STATUS 06/20/2009  . ALLERGIC RHINITIS 04/03/2008  . Essential hypertension 01/25/2008  . G E R D 07/05/2007  . PEPTIC ULCER DISEASE 07/05/2007  . Headache, chronic daily 07/05/2007  . NEPHROLITHIASIS, HX OF  07/05/2007    Past Medical History:  Diagnosis Date  . Back pain   . GERD (gastroesophageal reflux disease)   . Headache(784.0)   . History of nephrolithiasis   . Hypertension   . Plantar fasciitis   . Sleep apnea   . Ulcer 1980's   peptic ulcer disease    Past Surgical History:  Procedure Laterality Date  . ABDOMINAL HYSTERECTOMY  2002  . CHOLECYSTECTOMY  1984  . COLONOSCOPY  12/18/2011   Normal  . OOPHORECTOMY      Social History   Social History  . Marital status: Married    Spouse name: N/A  . Number of children: N/A  . Years of education: N/A   Occupational History  . ar specialist    Social History Main Topics  . Smoking status: Never Smoker  . Smokeless tobacco: Never Used  . Alcohol use No  . Drug use: No  . Sexual activity: Not on file   Other Topics Concern  . Not on file   Social History Narrative   Walks for exercise    Family History  Problem Relation Age of Onset  . Stroke Father   . Hypertension Father   . Diabetes Father   . Cancer Brother        stomach tumor    Allergies  Allergen Reactions  . Atenolol Cough  . Hydrocodone Nausea Only    Medication list reviewed and updated in full in [place].  ROS: GEN: Acute illness details above GI: Tolerating PO intake GU: maintaining adequate hydration and urination Pulm: No SOB Interactive and getting along well at home.  Otherwise, ROS is as per the HPI.  Objective:   BP 130/90   Pulse (!) 114   Temp 99 F (37.2 C) (Oral)   Ht 5\' 2"  (1.575 m)   Wt 174 lb 4 oz (79 kg)   SpO2 98%   BMI 31.87 kg/m    GEN: A and O x 3. WDWN. NAD.    ENT: Nose clear, ext NML.  No LAD.  No JVD.  TM's clear. Oropharynx clear.  PULM: Normal WOB, no distress. No crackles, wheezes, rhonchi. CV: RRR, no M/G/R, No rubs, No JVD.   EXT: warm and well-perfused, No c/c/e. PSYCH: Pleasant and conversant.   Laboratory and Imaging Data: No results found.  Assessment and Plan:   Acute  bronchitis due to infection  At this point, we reviewed supportive care. Given the length of time and risk factors, treat with medications below.  Medical decision making includes all plans, orders, medications, and patient instructions reviewed face to face.   Follow-up: No Follow-up on file.  New Prescriptions   AZITHROMYCIN (ZITHROMAX) 250 MG TABLET    2 tabs po on day 1, then 1 tab po for 4 days   Signed,  Devell Parkerson T. March Joos, MD   Patient's Medications  New Prescriptions   AZITHROMYCIN (ZITHROMAX) 250 MG TABLET    2 tabs po on day 1, then 1 tab po for 4 days  Previous Medications   AMLODIPINE (NORVASC) 5 MG TABLET    TAKE 1 TABLET (5 MG TOTAL) BY MOUTH DAILY.   BENZONATATE (TESSALON) 200 MG CAPSULE    Take 1 capsule (200 mg total) by mouth 3 (three) times daily as needed for cough. Do not bite or chew pill / swallow it whole   CELECOXIB (CELEBREX) 200 MG CAPSULE    Take 1 capsule (200 mg total) by mouth daily as needed.   CHOLECALCIFEROL (VITAMIN D3) 2000 UNITS CAPSULE    Take 2,000 Units by mouth daily.     ESOMEPRAZOLE (NEXIUM) 20 MG CAPSULE    Take 1 capsule (20 mg total) by mouth 2 (two) times daily before a meal.   GABAPENTIN (NEURONTIN) 300 MG CAPSULE    Take 300 mg by mouth 3 (three) times daily.   TRAMADOL (ULTRAM) 50 MG TABLET    Take 1-2 tablets (50-100 mg total) by mouth every 6 (six) hours as needed.  Modified Medications   No medications on file  Discontinued Medications   No medications on file

## 2017-10-14 ENCOUNTER — Ambulatory Visit: Payer: Self-pay

## 2017-10-14 MED ORDER — GUAIFENESIN-CODEINE 100-10 MG/5ML PO SOLN: 5.0000 mL | Freq: Every evening | ORAL | 0 refills | Status: DC | PRN

## 2017-10-14 NOTE — Telephone Encounter (Signed)
   Reason for Disposition . Cough has been present for > 3 weeks  Answer Assessment - Initial Assessment Questions 1. ONSET: "When did the cough begin?"      aT LEAST ONE MONTH 2. SEVERITY: "How bad is the cough today?"      Intermittent 3. RESPIRATORY DISTRESS: "Describe your breathing."      No 4. FEVER: "Do you have a fever?" If so, ask: "What is your temperature, how was it measured, and when did it start?"     No 5. SPUTUM: "Describe the color of your sputum" (clear, white, yellow, green)     Clear 6. HEMOPTYSIS: "Are you coughing up any blood?" If so ask: "How much?" (flecks, streaks, tablespoons, etc.)     Not now 7. CARDIAC HISTORY: "Do you have any history of heart disease?" (e.g., heart attack, congestive heart failure)      No  8. LUNG HISTORY: "Do you have any history of lung disease?"  (e.g., pulmonary embolus, asthma, emphysema)     No 9. PE RISK FACTORS: "Do you have a history of blood clots?" (or: recent major surgery, recent prolonged travel, bedridden )     No 10. OTHER SYMPTOMS: "Do you have any other symptoms?" (e.g., runny nose, wheezing, chest pain)       No 11. PREGNANCY: "Is there any chance you are pregnant?" "When was your last menstrual period?"       No 12. TRAVEL: "Have you traveled out of the country in the last month?" (e.g., travel history, exposures)       no  Protocols used: [place]

## 2017-10-14 NOTE — Telephone Encounter (Signed)
Pt. Finished antibiotic given 10/06/17 for bronchitis ; pt. Is still coughing - disturbing sleep. OTC not helping. Request medication for cough.

## 2017-10-14 NOTE — Telephone Encounter (Signed)
Will send to Dr. Lorelei Pont, who saw patient.

## 2017-10-14 NOTE — Telephone Encounter (Signed)
Ms. Cavendish notified as instructed by telephone.  Robitussin AC called into [facility] in [place] as instructed by Dr. Lorelei Pont.

## 2017-10-14 NOTE — Telephone Encounter (Signed)
I would be ok sending in some cough meds with codeine. This will be the strongest thing available - understanding that it causes sedation and she cannot drive with it.  Please call in:  Robitussin AC, 1-2 tsp po qhs prn cough, #120 mL, 0 ref

## 2017-10-14 NOTE — Addendum Note (Signed)
Addended by: Carter Kitten on: 10/14/2017 02:32 PM   Modules accepted: Orders

## 2017-10-14 NOTE — Addendum Note (Signed)
Addended by: Carter Kitten on: 10/14/2017 02:39 PM   Modules accepted: Orders

## 2017-10-27 ENCOUNTER — Telehealth: Payer: Self-pay | Admitting: Family Medicine

## 2017-10-27 NOTE — Telephone Encounter (Signed)
Copied from [place]. Topic: Quick Communication - See Telephone Encounter >> Oct 27, 2017  1:59 PM Bea Graff, NT wrote: CRM for notification. See Telephone encounter for: Patient would like to see if something can be called in for nausea.   10/27/17.

## 2017-10-27 NOTE — Telephone Encounter (Signed)
Attempted to contact pt regarding request that med be called in for nausea; left message of voice mail [phone] for pt to call Dr Marliss Coots office

## 2017-11-01 ENCOUNTER — Ambulatory Visit (INDEPENDENT_AMBULATORY_CARE_PROVIDER_SITE_OTHER): Payer: 59 | Admitting: Internal Medicine

## 2017-11-01 ENCOUNTER — Encounter: Payer: Self-pay | Admitting: Internal Medicine

## 2017-11-01 VITALS — BP 124/78 | HR 83 | Temp 98.4°F | Wt 175.5 lb

## 2017-11-01 DIAGNOSIS — R05 Cough: Secondary | ICD-10-CM

## 2017-11-01 DIAGNOSIS — R1013 Epigastric pain: Secondary | ICD-10-CM

## 2017-11-01 DIAGNOSIS — R110 Nausea: Secondary | ICD-10-CM | POA: Diagnosis not present

## 2017-11-01 DIAGNOSIS — K219 Gastro-esophageal reflux disease without esophagitis: Secondary | ICD-10-CM

## 2017-11-01 DIAGNOSIS — R058 Other specified cough: Secondary | ICD-10-CM

## 2017-11-01 MED ORDER — ONDANSETRON HCL 4 MG PO TABS: 4.0000 mg | ORAL_TABLET | Freq: Three times a day (TID) | ORAL | 0 refills | Status: DC | PRN

## 2017-11-01 NOTE — Patient Instructions (Signed)

## 2017-11-01 NOTE — Progress Notes (Signed)
Subjective:    Patient ID: Mindy Long, female    DOB: 1961/09/28, 56 y.o.   MRN: [id number]  HPI  Pt presents to the clinic today with c/o persistent cough. She reports this started 1 month ago. She was seen 10/31, diagnosed with acute bronchitis. She was treated with Azithromycin at that time. Since then, she reports continued cough. The cough is non productive. She denies runny nose, nasal congestion, sore throat or shortness of breath. She denies fever, chills or body aches. She has been using cough drops with some relief.  She also reports epigastric pain and nausea. This has been intermittent over the last 3 weeks. She denies any recent episodes of vomiting. Her bowels are moving normally. She denies recent changes in diet or medication. She has a history of GERD but only takes Nexium as needed. She has not taken any for her current symptoms. She has also not tried anything OTC for her symptoms. She denies sick contacts.  Review of Systems      Past Medical History:  Diagnosis Date  . Back pain   . GERD (gastroesophageal reflux disease)   . Headache(784.0)   . History of nephrolithiasis   . Hypertension   . Plantar fasciitis   . Sleep apnea   . Ulcer 1980's   peptic ulcer disease    Current Outpatient Medications  Medication Sig Dispense Refill  . amLODipine (NORVASC) 5 MG tablet TAKE 1 TABLET (5 MG TOTAL) BY MOUTH DAILY. 90 tablet 3  . celecoxib (CELEBREX) 200 MG capsule Take 1 capsule (200 mg total) by mouth daily as needed. 90 capsule 3  . Cholecalciferol (VITAMIN D3) 2000 UNITS capsule Take 2,000 Units by mouth daily.      Marland Kitchen esomeprazole (NEXIUM) 20 MG capsule Take 1 capsule (20 mg total) by mouth 2 (two) times daily before a meal. (Patient taking differently: Take 20 mg by mouth 2 (two) times daily as needed. ) 180 capsule 3  . gabapentin (NEURONTIN) 300 MG capsule Take 300 mg by mouth 3 (three) times daily.    . traMADol (ULTRAM) 50 MG tablet Take 1-2 tablets (50-100  mg total) by mouth every 6 (six) hours as needed. 30 tablet 1   No current facility-administered medications for this visit.     Allergies  Allergen Reactions  . Atenolol Cough  . Hydrocodone Nausea Only    Family History  Problem Relation Age of Onset  . Stroke Father   . Hypertension Father   . Diabetes Father   . Cancer Brother        stomach tumor    Social History   Socioeconomic History  . Marital status: Married    Spouse name: Not on file  . Number of children: Not on file  . Years of education: Not on file  . Highest education level: Not on file  Social Needs  . Financial resource strain: Not on file  . Food insecurity - worry: Not on file  . Food insecurity - inability: Not on file  . Transportation needs - medical: Not on file  . Transportation needs - non-medical: Not on file  Occupational History  . Occupation: ar specialist  Tobacco Use  . Smoking status: Never Smoker  . Smokeless tobacco: Never Used  Substance and Sexual Activity  . Alcohol use: No    Alcohol/week: 0.0 oz  . Drug use: No  . Sexual activity: Not on file  Other Topics Concern  . Not on file  Social  History Narrative   Walks for exercise     Constitutional: Denies fever, malaise, fatigue, headache or abrupt weight changes.  HEENT: Denies eye pain, eye redness, ear pain, ringing in the ears, wax buildup, runny nose, nasal congestion, bloody nose, or sore throat. Respiratory: Pt reports cough. Denies difficulty breathing, shortness of breath, or sputum production.   Cardiovascular: Denies chest pain, chest tightness, palpitations or swelling in the hands or feet.  Gastrointestinal: Pt reports nausea and epigastric pain. Denies bloating, constipation, diarrhea or blood in the stool.    No other specific complaints in a complete review of systems (except as listed in HPI above).  Objective:   Physical Exam   BP 124/78   Pulse 83   Temp 98.4 F (36.9 C) (Oral)   Wt 175 lb 8  oz (79.6 kg)   SpO2 98%   BMI 32.10 kg/m  Wt Readings from Last 3 Encounters:  11/01/17 175 lb 8 oz (79.6 kg)  10/06/17 174 lb 4 oz (79 kg)  08/04/17 168 lb 4 oz (76.3 kg)    General: Appears her stated age, in NAD. HEENT: Throat/Mouth: Teeth present, mucosa pink and moist, no exudate, lesions or ulcerations noted.  Neck:  No adenopathy noted. Pulmonary/Chest: Normal effort and positive vesicular breath sounds. No respiratory distress. No wheezes, rales or ronchi noted.  Abdomen: Soft and nontender. Normal bowel sounds. No distention or masses noted.    BMET    Component Value Date/Time   NA 139 07/27/2017 1103   K 3.6 07/27/2017 1103   CL 105 07/27/2017 1103   CO2 29 07/27/2017 1103   GLUCOSE 88 07/27/2017 1103   BUN 10 07/27/2017 1103   CREATININE 0.81 07/27/2017 1103   CALCIUM 9.4 07/27/2017 1103   GFRNONAA 66 (L) 01/16/2013 1028   GFRAA 76 (L) 01/16/2013 1028    Lipid Panel     Component Value Date/Time   CHOL 194 07/27/2017 1103   TRIG 63.0 07/27/2017 1103   HDL 84.30 07/27/2017 1103   CHOLHDL 2 07/27/2017 1103   VLDL 12.6 07/27/2017 1103   LDLCALC 97 07/27/2017 1103    CBC    Component Value Date/Time   WBC 4.3 07/27/2017 1103   RBC 5.46 (H) 07/27/2017 1103   HGB 13.6 07/27/2017 1103   HCT 42.9 07/27/2017 1103   PLT 249.0 07/27/2017 1103   MCV 78.5 07/27/2017 1103   MCH 24.9 (L) 01/16/2013 1028   MCHC 31.8 07/27/2017 1103   RDW 14.0 07/27/2017 1103   LYMPHSABS 1.8 07/27/2017 1103   MONOABS 0.2 07/27/2017 1103   EOSABS 0.1 07/27/2017 1103   BASOSABS 0.0 07/27/2017 1103    Hgb A1C Lab Results  Component Value Date   HGBA1C 5.9 08/18/2017           Assessment & Plan:   Post Viral Cough:  Advised her this could last up to a few more weeks Continue cough drops as needed  Epigastric Pain, Nausea:  Advised her to start taking her Nexium daily x 1 week eRx for Zofran 4 mg TID prn  Return precautions discussed Webb Silversmith, NP

## 2017-11-10 ENCOUNTER — Ambulatory Visit
Admission: RE | Admit: 2017-11-10 | Discharge: 2017-11-10 | Disposition: A | Discharge status: Home / Self Care | Payer: 59 | Source: Ambulatory Visit | Attending: Otolaryngology | Admitting: Otolaryngology

## 2017-11-10 DIAGNOSIS — E041 Nontoxic single thyroid nodule: Secondary | ICD-10-CM

## 2017-11-12 ENCOUNTER — Ambulatory Visit: Payer: 59 | Admitting: Family Medicine

## 2017-12-14 ENCOUNTER — Ambulatory Visit (INDEPENDENT_AMBULATORY_CARE_PROVIDER_SITE_OTHER): Payer: 59 | Admitting: Family Medicine

## 2017-12-14 ENCOUNTER — Encounter: Payer: Self-pay | Admitting: Family Medicine

## 2017-12-14 VITALS — BP 124/76 | HR 115 | Temp 98.7°F | Ht 62.0 in | Wt 175.8 lb

## 2017-12-14 DIAGNOSIS — R05 Cough: Secondary | ICD-10-CM

## 2017-12-14 DIAGNOSIS — R058 Other specified cough: Secondary | ICD-10-CM

## 2017-12-14 DIAGNOSIS — R053 Chronic cough: Secondary | ICD-10-CM | POA: Insufficient documentation

## 2017-12-14 MED ORDER — PROMETHAZINE-DM 6.25-15 MG/5ML PO SYRP: 5.0000 mL | ORAL_SOLUTION | Freq: Four times a day (QID) | ORAL | 0 refills | Status: DC | PRN

## 2017-12-14 MED ORDER — BENZONATATE 200 MG PO CAPS: 200.0000 mg | ORAL_CAPSULE | Freq: Three times a day (TID) | ORAL | 1 refills | Status: DC | PRN

## 2017-12-14 NOTE — Assessment & Plan Note (Signed)
From another uri in dec Tessalon  prometh dm also for cough (if sedating try mucinex dm)  Fluids/rest  Watch for fever or other symptoms  Disc symptomatic care - see instructions on AVS  Update if not starting to improve in a week or if worsening

## 2017-12-14 NOTE — Progress Notes (Signed)
Subjective:    Patient ID: Mindy Long, female    DOB: 02/14/1961, 57 y.o.   MRN: [id number]  HPI  Here for cough and congestion since fall/ oct   Wt Readings from Last 3 Encounters:  12/14/17 175 lb 12 oz (79.7 kg)  11/01/17 175 lb 8 oz (79.6 kg)  10/06/17 174 lb 4 oz (79 kg)   Temp: 98.7 F (37.1 C)    She was px azithro by Dr Lorelei Pont in oct for bronchitis  Seen for post viral cough by NP Baity and tx for gerd in nov   Since then she continues to cough  Felt better overall in other ways   Traveled to [place] - got another cold from grandchildren - (they had runny noses)  She coughs more when she is hot  Cough drops make her worse  No fever  Cough is prod - phlegm is clear  A little dry skin/? Rash over upper forehead -hairdresser noted   pnd / tickle in throat -not sore No ear symptoms   otc - took robitussin  [facility] brand nyquil formula   She has tramadol at home    Gets nauseated from hydrocodone   Patient Active Problem List   Diagnosis Date Noted  . Post-viral cough syndrome 12/14/2017  . Eye exam abnormal 08/04/2017  . Thoracic back pain 09/06/2015  . Tinnitus 11/13/2014  . History of shingles 04/27/2014  . Back pain 04/20/2014  . OSA (obstructive sleep apnea) 09/27/2012  . Snoring 06/21/2012  . Fatigue 06/20/2012  . BPPV (benign paroxysmal positional vertigo) 12/14/2011  . Colon cancer screening 11/04/2011  . Routine general medical examination at a health care facility 10/22/2011  . Chronic foot pain 09/11/2011  . PLANTAR FASCIITIS 09/05/2010  . Vitamin D deficiency 07/02/2010  . DISTURBANCE OF SKIN SENSATION 04/07/2010  . PALPITATIONS 04/07/2010  . HYPOKALEMIA 07/10/2009  . PERIMENOPAUSAL STATUS 06/20/2009  . ALLERGIC RHINITIS 04/03/2008  . Essential hypertension 01/25/2008  . G E R D 07/05/2007  . PEPTIC ULCER DISEASE 07/05/2007  . Headache, chronic daily 07/05/2007  . NEPHROLITHIASIS, HX OF 07/05/2007   Past Medical History:    Diagnosis Date  . Back pain   . GERD (gastroesophageal reflux disease)   . Headache(784.0)   . History of nephrolithiasis   . Hypertension   . Plantar fasciitis   . Sleep apnea   . Ulcer 1980's   peptic ulcer disease   Past Surgical History:  Procedure Laterality Date  . ABDOMINAL HYSTERECTOMY  2002  . CHOLECYSTECTOMY  1984  . COLONOSCOPY  12/18/2011   Normal  . OOPHORECTOMY     Social History   Tobacco Use  . Smoking status: Never Smoker  . Smokeless tobacco: Never Used  Substance Use Topics  . Alcohol use: No    Alcohol/week: 0.0 oz  . Drug use: No   Family History  Problem Relation Age of Onset  . Stroke Father   . Hypertension Father   . Diabetes Father   . Cancer Brother        stomach tumor   Allergies  Allergen Reactions  . Atenolol Cough  . Hydrocodone Nausea Only   Current Outpatient Medications on File Prior to Visit  Medication Sig Dispense Refill  . amLODipine (NORVASC) 5 MG tablet TAKE 1 TABLET (5 MG TOTAL) BY MOUTH DAILY. 90 tablet 3  . celecoxib (CELEBREX) 200 MG capsule Take 1 capsule (200 mg total) by mouth daily as needed. 90 capsule 3  . Cholecalciferol (  VITAMIN D3) 2000 UNITS capsule Take 2,000 Units by mouth daily.      Marland Kitchen esomeprazole (NEXIUM) 20 MG capsule Take 1 capsule (20 mg total) by mouth 2 (two) times daily before a meal. (Patient taking differently: Take 20 mg by mouth 2 (two) times daily as needed. ) 180 capsule 3  . gabapentin (NEURONTIN) 300 MG capsule Take 300 mg by mouth 3 (three) times daily.    . ondansetron (ZOFRAN) 4 MG tablet Take 1 tablet (4 mg total) by mouth every 8 (eight) hours as needed. 20 tablet 0  . traMADol (ULTRAM) 50 MG tablet Take 1-2 tablets (50-100 mg total) by mouth every 6 (six) hours as needed. 30 tablet 1   No current facility-administered medications on file prior to visit.     Review of Systems  Constitutional: Negative for activity change, appetite change, fatigue, fever and unexpected weight change.   HENT: Positive for postnasal drip and rhinorrhea. Negative for congestion, ear pain, sinus pressure, sinus pain and sore throat.   Eyes: Negative for pain, redness and visual disturbance.  Respiratory: Positive for cough. Negative for shortness of breath and wheezing.   Cardiovascular: Negative for chest pain and palpitations.  Gastrointestinal: Negative for abdominal pain, blood in stool, constipation and diarrhea.  Endocrine: Negative for polydipsia and polyuria.  Genitourinary: Negative for dysuria, frequency and urgency.  Musculoskeletal: Negative for arthralgias, back pain and myalgias.  Skin: Negative for pallor and rash.  Allergic/Immunologic: Negative for environmental allergies.  Neurological: Negative for dizziness, syncope and headaches.  Hematological: Negative for adenopathy. Does not bruise/bleed easily.  Psychiatric/Behavioral: Negative for decreased concentration and dysphoric mood. The patient is not nervous/anxious.        Objective:   Physical Exam  Constitutional: She appears well-developed and well-nourished. No distress.  HENT:  Head: Normocephalic and atraumatic.  Right Ear: External ear normal.  Left Ear: External ear normal.  Mouth/Throat: Oropharynx is clear and moist.  Nares are injected and congested  No sinus tenderness Clear rhinorrhea and post nasal drip   Eyes: Conjunctivae and EOM are normal. Pupils are equal, round, and reactive to light. Right eye exhibits no discharge. Left eye exhibits no discharge.  Neck: Normal range of motion. Neck supple.  Cardiovascular: Normal rate and normal heart sounds.  Pulmonary/Chest: Effort normal and breath sounds normal. No respiratory distress. She has no wheezes. She has no rales. She exhibits no tenderness.  Good air exch No rales or rhonchi   Lymphadenopathy:    She has no cervical adenopathy.  Neurological: She is alert.  Skin: Skin is warm and dry. No rash noted. No pallor.  Psychiatric: She has a normal  mood and affect.          Assessment & Plan:   Problem List Items Addressed This Visit      Respiratory   Post-viral cough syndrome - Primary    From another uri in dec Tessalon  prometh dm also for cough (if sedating try mucinex dm)  Fluids/rest  Watch for fever or other symptoms  Disc symptomatic care - see instructions on AVS  Update if not starting to improve in a week or if worsening

## 2017-12-14 NOTE — Patient Instructions (Signed)
I think you have a post viral cough syndrome   Drink lots of fluids Avoid things that make you cough  Take the tessalon three times daily  Then prometh-DM px - up to every 4 hours  If this is sedating use mucinex DM during the day and the prometh dm at night  Do not mix the two   An antihistamine like claritin or zytec over the counter will also help post nasal drip  Update if not starting to improve in a week or if worsening

## 2017-12-22 ENCOUNTER — Ambulatory Visit (INDEPENDENT_AMBULATORY_CARE_PROVIDER_SITE_OTHER)
Admission: RE | Admit: 2017-12-22 | Discharge: 2017-12-22 | Disposition: A | Discharge status: Home / Self Care | Payer: 59 | Source: Ambulatory Visit | Attending: Family Medicine | Admitting: Family Medicine

## 2017-12-22 ENCOUNTER — Telehealth: Payer: Self-pay | Admitting: *Deleted

## 2017-12-22 DIAGNOSIS — R05 Cough: Secondary | ICD-10-CM

## 2017-12-22 DIAGNOSIS — R058 Other specified cough: Secondary | ICD-10-CM

## 2017-12-22 NOTE — Telephone Encounter (Signed)
Copied from [place] [id number]. Topic: General - Other >> Dec 22, 2017  9:15 AM Carolyn Stare wrote:  Pt call to ask if DR Glori Bickers will her in a neubulizer said she still has a cough  Pharmacy [facility]

## 2017-12-22 NOTE — Telephone Encounter (Signed)
I use nebs when a pt has known wheezing and cannot use a MDI for some reason  Instead-I would like to get a cxr -please have her come in when she can (let me know and I will do the order)  Thanks

## 2017-12-22 NOTE — Telephone Encounter (Signed)
Is her cough improved at all or worse or the same?   Any wheezing or chest tightness?  Does she have a hx of asthma or other reactive airway disease?  (or used a nebulizer or inhaler in the past for wheezing)?  Thanks

## 2017-12-22 NOTE — Telephone Encounter (Signed)
Pt advise of Dr. Marliss Coots comments, she will come in today for chest xray, Terri showed me the correct order so order is in

## 2017-12-22 NOTE — Telephone Encounter (Signed)
Pt said her cough is about the same but she is waking up often at night due to bad coughing spells. Pt said all day she is coughing it feels like the phlegm is stuck in her throat and she can't get it up. But said she doesn't have a history of asthma or reactive airway ds but her sister also had bronchitis and she was given a nebulizer and it helped so pt was trying to get one too. She doesn't have any wheezing or chest tightness that she is aware of

## 2017-12-26 ENCOUNTER — Telehealth: Payer: Self-pay | Admitting: Family Medicine

## 2017-12-26 DIAGNOSIS — R05 Cough: Secondary | ICD-10-CM

## 2017-12-26 DIAGNOSIS — R053 Chronic cough: Secondary | ICD-10-CM

## 2017-12-26 NOTE — Telephone Encounter (Signed)
Ref done  Will route to [facility] 

## 2017-12-26 NOTE — Telephone Encounter (Signed)
-----   Message from Tammi Sou, [place] sent at 12/24/2017  1:05 PM EST ----- Pt notified of xray results and Dr. Marliss Coots comments. Pt agrees with referral to pulmonary, please put order in and I advised pt our [facility] will call the office to schedule appt

## 2017-12-28 NOTE — Telephone Encounter (Signed)
Appt made and patient aware.  

## 2018-01-10 ENCOUNTER — Other Ambulatory Visit: Payer: Self-pay | Admitting: Family Medicine

## 2018-01-11 NOTE — Telephone Encounter (Signed)
CPE was on 08/04/17, pt also had a few recent acute appts., last filled on 07/28/16 #90 caps with 3 additional refills, please advise

## 2018-01-12 ENCOUNTER — Encounter: Payer: Self-pay | Admitting: Emergency Medicine

## 2018-01-12 ENCOUNTER — Ambulatory Visit (INDEPENDENT_AMBULATORY_CARE_PROVIDER_SITE_OTHER): Payer: 59 | Admitting: Emergency Medicine

## 2018-01-12 ENCOUNTER — Other Ambulatory Visit (INDEPENDENT_AMBULATORY_CARE_PROVIDER_SITE_OTHER): Payer: 59

## 2018-01-12 VITALS — BP 118/74 | HR 88 | Ht 63.0 in | Wt 175.0 lb

## 2018-01-12 DIAGNOSIS — D869 Sarcoidosis, unspecified: Secondary | ICD-10-CM

## 2018-01-12 DIAGNOSIS — R053 Chronic cough: Secondary | ICD-10-CM

## 2018-01-12 DIAGNOSIS — R05 Cough: Secondary | ICD-10-CM | POA: Diagnosis not present

## 2018-01-12 LAB — CBC WITH DIFFERENTIAL/PLATELET
BASOS ABS: 0.1 10*3/uL (ref 0.0–0.1)
Basophils Relative: 1 % (ref 0.0–3.0)
EOS ABS: 0.1 10*3/uL (ref 0.0–0.7)
Eosinophils Relative: 2.1 % (ref 0.0–5.0)
HCT: 42.9 % (ref 36.0–46.0)
Hemoglobin: 13.9 g/dL (ref 12.0–15.0)
LYMPHS ABS: 2.3 10*3/uL (ref 0.7–4.0)
Lymphocytes Relative: 40.8 % (ref 12.0–46.0)
MCHC: 32.5 g/dL (ref 30.0–36.0)
MCV: 77.6 fl — ABNORMAL LOW (ref 78.0–100.0)
MONO ABS: 0.4 10*3/uL (ref 0.1–1.0)
Monocytes Relative: 6.3 % (ref 3.0–12.0)
NEUTROS PCT: 49.8 % (ref 43.0–77.0)
Neutro Abs: 2.8 10*3/uL (ref 1.4–7.7)
Platelets: 242 10*3/uL (ref 150.0–400.0)
RBC: 5.53 Mil/uL — AB (ref 3.87–5.11)
RDW: 14.1 % (ref 11.5–15.5)
WBC: 5.7 10*3/uL (ref 4.0–10.5)

## 2018-01-12 MED ORDER — PREDNISONE 10 MG PO TABS: 40.0000 mg | ORAL_TABLET | Freq: Every day | ORAL | 0 refills | Status: DC

## 2018-01-12 NOTE — Progress Notes (Signed)
Subjective:    Patient ID: Mindy Long, female    DOB: 08-09-1961, 57 y.o.   MRN: [id number]  HPI 57 year old woman, never smoker, with a history of obstructive sleep apnea seen here in the past by Dr. Gwenette Greet.  Also with GERD, hypertension.  She is referred today for evaluation of chronic cough.  She tells me that she has been coughing since October 2018. She was well, then caught a URI +/- bronchitis, was treated first w azithro. Cough persisted so she was given benzonatate, has used OTC cough syrup. May have improved some. She may have had another URI in December, flared things up again. She has continued to cough. CXR done 12/23/17, reviewed, normal. She has nexium, but has not used it much during this time. Lots of throat clearing. Cough is non-productive. Minimal congestion. She has noticed a sensitivity to perfume, causes cough.   Her aunt has sarcoidosis.    Review of Systems  Constitutional: Negative for fever and unexpected weight change.  HENT: Negative for congestion, dental problem, ear pain, nosebleeds, postnasal drip, rhinorrhea, sinus pressure, sneezing, sore throat and trouble swallowing.   Eyes: Negative for redness and itching.  Respiratory: Positive for cough. Negative for chest tightness, shortness of breath and wheezing.   Cardiovascular: Negative for palpitations and leg swelling.  Gastrointestinal: Negative for nausea and vomiting.  Genitourinary: Negative for dysuria.  Musculoskeletal: Negative for joint swelling.  Skin: Negative for rash.  Neurological: Negative for headaches.  Hematological: Does not bruise/bleed easily.  Psychiatric/Behavioral: Negative for dysphoric mood. The patient is not nervous/anxious.     Past Medical History:  Diagnosis Date  . Back pain   . GERD (gastroesophageal reflux disease)   . Headache(784.0)   . History of nephrolithiasis   . Hypertension   . Plantar fasciitis   . Sleep apnea   . Ulcer 1980's   peptic ulcer disease     Family History  Problem Relation Age of Onset  . Stroke Father   . Hypertension Father   . Diabetes Father   . Cancer Brother        stomach tumor     Social History   Socioeconomic History  . Marital status: Married    Spouse name: Not on file  . Number of children: Not on file  . Years of education: Not on file  . Highest education level: Not on file  Social Needs  . Financial resource strain: Not on file  . Food insecurity - worry: Not on file  . Food insecurity - inability: Not on file  . Transportation needs - medical: Not on file  . Transportation needs - non-medical: Not on file  Occupational History  . Occupation: ar specialist  Tobacco Use  . Smoking status: Never Smoker  . Smokeless tobacco: Never Used  Substance and Sexual Activity  . Alcohol use: No    Alcohol/week: 0.0 oz  . Drug use: No  . Sexual activity: Not on file  Other Topics Concern  . Not on file  Social History Narrative   Walks for exercise  has worked in an office.   Allergies  Allergen Reactions  . Atenolol Cough  . Hydrocodone Nausea Only     Outpatient Medications Prior to Visit  Medication Sig Dispense Refill  . amLODipine (NORVASC) 5 MG tablet TAKE 1 TABLET (5 MG TOTAL) BY MOUTH DAILY. 90 tablet 3  . benzonatate (TESSALON) 200 MG capsule Take 1 capsule (200 mg total) by mouth 3 (three) times daily  as needed for cough. Swallow whole, do not bite pill 30 capsule 1  . celecoxib (CELEBREX) 200 MG capsule TAKE 1 CAPSULE (200 MG TOTAL) BY MOUTH DAILY AS NEEDED. 90 capsule 0  . Cholecalciferol (VITAMIN D3) 2000 UNITS capsule Take 2,000 Units by mouth daily.      Marland Kitchen esomeprazole (NEXIUM) 20 MG capsule Take 1 capsule (20 mg total) by mouth 2 (two) times daily before a meal. (Patient taking differently: Take 20 mg by mouth 2 (two) times daily as needed. ) 180 capsule 3  . gabapentin (NEURONTIN) 300 MG capsule Take 300 mg by mouth 3 (three) times daily.    . ondansetron (ZOFRAN) 4 MG tablet  Take 1 tablet (4 mg total) by mouth every 8 (eight) hours as needed. 20 tablet 0  . promethazine-dextromethorphan (PROMETHAZINE-DM) 6.25-15 MG/5ML syrup Take 5 mLs by mouth 4 (four) times daily as needed for cough. Watch out for sedation 120 mL 0  . traMADol (ULTRAM) 50 MG tablet Take 1-2 tablets (50-100 mg total) by mouth every 6 (six) hours as needed. 30 tablet 1   No facility-administered medications prior to visit.          Objective:   Physical Exam Vitals:   01/12/18 1530  BP: 118/74  Pulse: 88  SpO2: 95%  Weight: 175 lb (79.4 kg)  Height: 5\' 3"  (1.6 m)   Gen: Pleasant, well-nourished, in no distress,  normal affect  ENT: No lesions,  mouth clear,  oropharynx clear, no postnasal drip, frequent throat clearing  Neck: No JVD, no stridor  Lungs: No use of accessory muscles, no wheeze or crackles.   Cardiovascular: RRR, heart sounds normal, no murmur or gallops, no peripheral edema  Musculoskeletal: No deformities, no cyanosis or clubbing  Neuro: alert, non focal  Skin: Warm, no lesions or rash       Assessment & Plan:  Chronic cough Etiology unclear but suspect contribution from GERD, unclear how well controlled this is.  She has springtime allergies but none currently.  I believe it is reasonable to treat her empirically for both GERD and allergies to see if she gets any benefit.  I will give her prednisone for 5 days.  Delsym for cough suppression.  We will check an IgE, eosinophil count, an ACE level today.  Consider further imaging if our suspicion for sarcoidosis increases.  Please start taking Nexium (omeprazole) 20 mg twice a day until next visit.  Take this 1 hour before or 1 hour after eating. Please start loratadine 10 mg daily until next visit. Take prednisone 40 mg daily for 5 days.  Try to avoid throat clearing.  Use a sugar-free candy (non-mentholated) to soothe your throat.  When you want to clear your throat, just swallow. Use Delsym as directed to  suppress your cough. Blood work today We will perform pulmonary function testing Follow with Dr Lamonte Sakai in 1 month or next available with full PFT  Baltazar Apo, MD, PhD 01/12/2018, 4:01 PM [facility] [phone] or if no answer [phone]

## 2018-01-12 NOTE — Patient Instructions (Signed)
Please start taking Nexium (omeprazole) 20 mg twice a day until next visit.  Take this 1 hour before or 1 hour after eating. Please start loratadine 10 mg daily until next visit. Take prednisone 40 mg daily for 5 days.  Try to avoid throat clearing.  Use a sugar-free candy (non-mentholated) to soothe your throat.  When you want to clear your throat, just swallow. Use Delsym as directed to suppress your cough. Blood work today We will perform pulmonary function testing Follow with Dr Lamonte Sakai in 1 month or next available with full PFT

## 2018-01-12 NOTE — Assessment & Plan Note (Signed)
Etiology unclear but suspect contribution from GERD, unclear how well controlled this is.  She has springtime allergies but none currently.  I believe it is reasonable to treat her empirically for both GERD and allergies to see if she gets any benefit.  I will give her prednisone for 5 days.  Delsym for cough suppression.  We will check an IgE, eosinophil count, an ACE level today.  Consider further imaging if our suspicion for sarcoidosis increases.  Please start taking Nexium (omeprazole) 20 mg twice a day until next visit.  Take this 1 hour before or 1 hour after eating. Please start loratadine 10 mg daily until next visit. Take prednisone 40 mg daily for 5 days.  Try to avoid throat clearing.  Use a sugar-free candy (non-mentholated) to soothe your throat.  When you want to clear your throat, just swallow. Use Delsym as directed to suppress your cough. Blood work today We will perform pulmonary function testing Follow with Dr Lamonte Sakai in 1 month or next available with full PFT

## 2018-01-13 LAB — ANGIOTENSIN CONVERTING ENZYME: ANGIOTENSIN-CONVERTING ENZYME: 94 U/L — AB (ref 9–67)

## 2018-01-13 LAB — IGE: IgE (Immunoglobulin E), Serum: 9 kU/L (ref <=114)

## 2018-02-08 ENCOUNTER — Other Ambulatory Visit: Payer: Self-pay | Admitting: Family Medicine

## 2018-02-08 DIAGNOSIS — Z139 Encounter for screening, unspecified: Secondary | ICD-10-CM

## 2018-02-10 ENCOUNTER — Ambulatory Visit (INDEPENDENT_AMBULATORY_CARE_PROVIDER_SITE_OTHER): Payer: 59 | Admitting: Emergency Medicine

## 2018-02-10 ENCOUNTER — Encounter: Payer: Self-pay | Admitting: Emergency Medicine

## 2018-02-10 VITALS — BP 128/84 | HR 94 | Ht 62.75 in | Wt 173.0 lb

## 2018-02-10 DIAGNOSIS — R05 Cough: Secondary | ICD-10-CM

## 2018-02-10 DIAGNOSIS — D869 Sarcoidosis, unspecified: Secondary | ICD-10-CM

## 2018-02-10 DIAGNOSIS — K219 Gastro-esophageal reflux disease without esophagitis: Secondary | ICD-10-CM | POA: Diagnosis not present

## 2018-02-10 DIAGNOSIS — J45991 Cough variant asthma: Secondary | ICD-10-CM

## 2018-02-10 DIAGNOSIS — R053 Chronic cough: Secondary | ICD-10-CM

## 2018-02-10 LAB — PULMONARY FUNCTION TEST
DL/VA % pred: 119 %
DL/VA: 5.52 ml/min/mmHg/L
DLCO cor % pred: 88 %
DLCO cor: 20.04 ml/min/mmHg
DLCO unc % pred: 90 %
DLCO unc: 20.34 ml/min/mmHg
FEF 25-75 Post: 3.87 L/sec
FEF 25-75 Pre: 3.56 L/sec
FEF2575-%Change-Post: 8 %
FEF2575-%Pred-Post: 190 %
FEF2575-%Pred-Pre: 175 %
FEV1-%Change-Post: 2 %
FEV1-%Pred-Post: 109 %
FEV1-%Pred-Pre: 106 %
FEV1-Post: 2.2 L
FEV1-Pre: 2.15 L
FEV1FVC-%Change-Post: 2 %
FEV1FVC-%Pred-Pre: 111 %
FEV6-%Change-Post: 0 %
FEV6-%Pred-Post: 97 %
FEV6-%Pred-Pre: 97 %
FEV6-Post: 2.41 L
FEV6-Pre: 2.41 L
FEV6FVC-%Change-Post: 0 %
FEV6FVC-%Pred-Post: 102 %
FEV6FVC-%Pred-Pre: 103 %
FVC-%Change-Post: 0 %
FVC-%Pred-Post: 94 %
FVC-%Pred-Pre: 94 %
FVC-Post: 2.41 L
FVC-Pre: 2.41 L
Post FEV1/FVC ratio: 91 %
Post FEV6/FVC ratio: 100 %
Pre FEV1/FVC ratio: 89 %
Pre FEV6/FVC Ratio: 100 %
RV % pred: 63 %
RV: 1.2 L
TLC % pred: 76 %
TLC: 3.73 L

## 2018-02-10 NOTE — Assessment & Plan Note (Signed)
Decrease Nexium back to 40 mg daily, see if she tolerates with regard to her cough and reflux symptoms.

## 2018-02-10 NOTE — Progress Notes (Signed)
PFT done today. 

## 2018-02-10 NOTE — Patient Instructions (Addendum)
We will perform a CT scan of the chest with contrast to better evaluate your cough and for any evidence of sarcoidosis.  We will perform a methacholine challenge breathing test to better evaluate for cough-variant asthma.  Try to avoid throat clearing.  Use a sugar-free candy (non-mentholated) to soothe your throat.  When you want to clear your throat, just swallow Continue loratadine 10 mg daily. Decrease your Nexium to 40 mg once a day.  Take this 1 hour before eating. Follow with Mindy Long next available after your testing is performed to review the results together.

## 2018-02-10 NOTE — Assessment & Plan Note (Signed)
Continue loratadine for now.  We may be able to scale back in the future

## 2018-02-10 NOTE — Assessment & Plan Note (Signed)
Suspect that her chronic cough was impacted by GERD as she did improve some with the initiation of the Nexium.  She is also on loratadine although she minimizes her allergic symptoms.  Interestingly her ACE level is elevated in absence of any notable chest x-ray abnormality.  She is at risk for sarcoidosis, at risk for cough variant asthma.  Her pulmonary function testing today did not show any obstruction.  I believe that we need to more thoroughly workup possible obstructive lung disease, any potential evidence for sarcoidosis.   We will perform a CT scan of the chest with contrast to better evaluate your cough and for any evidence of sarcoidosis.  We will perform a methacholine challenge breathing test to better evaluate for cough-variant asthma.  Try to avoid throat clearing.  Use a sugar-free candy (non-mentholated) to soothe your throat.  When you want to clear your throat, just swallow Continue loratadine 10 mg daily. Decrease your Nexium to 40 mg once a day.  Take this 1 hour before eating. Follow with Dr Lamonte Sakai next available after your testing is performed to review the results together

## 2018-02-10 NOTE — Progress Notes (Signed)
Subjective:    Patient ID: Mindy Long, female    DOB: 05-14-1961, 57 y.o.   MRN: [id number]  Cough  Pertinent negatives include no ear pain, eye redness, fever, headaches, postnasal drip, rash, rhinorrhea, sore throat, shortness of breath or wheezing.   57 year old woman, never smoker, with a history of obstructive sleep apnea seen here in the past by Dr. Gwenette Greet.  Also with GERD, hypertension.  She is referred today for evaluation of chronic cough.  She tells me that she has been coughing since October 2018. She was well, then caught a URI +/- bronchitis, was treated first w azithro. Cough persisted so she was given benzonatate, has used OTC cough syrup. May have improved some. She may have had another URI in December, flared things up again. She has continued to cough. CXR done 12/23/17, reviewed, normal. She has nexium, but has not used it much during this time. Lots of throat clearing. Cough is non-productive. Minimal congestion. She has noticed a sensitivity to perfume, causes cough.   Her aunt has sarcoidosis.   ROV 02/10/18 --patient follows up today for her chronic cough.  She is a never smoker with a history of sleep apnea.  She has hypertension and GERD, seasonal allergies.  She had a normal chest x-ray 12/22/17.  At her last visit I added loratadine, Nexium twice daily.  I also gave her 5 days of prednisone.  She reports today.   She underwent pulmonary function testing today that I have personally reviewed.  This shows some evidence for restriction on her lung volumes.  Spirometry shows normal FEV1 and FVC but the ratio is elevated at 89%.  Diffusion capacity is normal.  Her flow volume loop is normal.   IgE 2/6 >> 9, ACE level 2/6 94, absolute eosinophils 2/6 >> 100/mcL   Review of Systems  Constitutional: Negative for fever and unexpected weight change.  HENT: Negative for congestion, dental problem, ear pain, nosebleeds, postnasal drip, rhinorrhea, sinus pressure, sneezing, sore  throat and trouble swallowing.   Eyes: Negative for redness and itching.  Respiratory: Positive for cough. Negative for chest tightness, shortness of breath and wheezing.   Cardiovascular: Negative for palpitations and leg swelling.  Gastrointestinal: Negative for nausea and vomiting.  Genitourinary: Negative for dysuria.  Musculoskeletal: Negative for joint swelling.  Skin: Negative for rash.  Neurological: Negative for headaches.  Hematological: Does not bruise/bleed easily.  Psychiatric/Behavioral: Negative for dysphoric mood. The patient is not nervous/anxious.     Past Medical History:  Diagnosis Date  . Back pain   . GERD (gastroesophageal reflux disease)   . Headache(784.0)   . History of nephrolithiasis   . Hypertension   . Plantar fasciitis   . Sleep apnea   . Ulcer 1980's   peptic ulcer disease     Family History  Problem Relation Age of Onset  . Stroke Father   . Hypertension Father   . Diabetes Father   . Cancer Brother        stomach tumor     Social History   Socioeconomic History  . Marital status: Married    Spouse name: Not on file  . Number of children: Not on file  . Years of education: Not on file  . Highest education level: Not on file  Social Needs  . Financial resource strain: Not on file  . Food insecurity - worry: Not on file  . Food insecurity - inability: Not on file  . Transportation needs - medical: Not  on file  . Transportation needs - non-medical: Not on file  Occupational History  . Occupation: ar specialist  Tobacco Use  . Smoking status: Never Smoker  . Smokeless tobacco: Never Used  Substance and Sexual Activity  . Alcohol use: No    Alcohol/week: 0.0 oz  . Drug use: No  . Sexual activity: Not on file  Other Topics Concern  . Not on file  Social History Narrative   Walks for exercise  has worked in an office.   Allergies  Allergen Reactions  . Atenolol Cough  . Hydrocodone Nausea Only     Outpatient Medications  Prior to Visit  Medication Sig Dispense Refill  . amLODipine (NORVASC) 5 MG tablet TAKE 1 TABLET (5 MG TOTAL) BY MOUTH DAILY. 90 tablet 3  . benzonatate (TESSALON) 200 MG capsule Take 1 capsule (200 mg total) by mouth 3 (three) times daily as needed for cough. Swallow whole, do not bite pill 30 capsule 1  . celecoxib (CELEBREX) 200 MG capsule TAKE 1 CAPSULE (200 MG TOTAL) BY MOUTH DAILY AS NEEDED. 90 capsule 0  . Cholecalciferol (VITAMIN D3) 2000 UNITS capsule Take 2,000 Units by mouth daily.      Marland Kitchen esomeprazole (NEXIUM) 20 MG capsule Take 1 capsule (20 mg total) by mouth 2 (two) times daily before a meal. (Patient taking differently: Take 20 mg by mouth 2 (two) times daily as needed. ) 180 capsule 3  . gabapentin (NEURONTIN) 300 MG capsule Take 300 mg by mouth 3 (three) times daily.    . ondansetron (ZOFRAN) 4 MG tablet Take 1 tablet (4 mg total) by mouth every 8 (eight) hours as needed. 20 tablet 0  . promethazine-dextromethorphan (PROMETHAZINE-DM) 6.25-15 MG/5ML syrup Take 5 mLs by mouth 4 (four) times daily as needed for cough. Watch out for sedation 120 mL 0  . traMADol (ULTRAM) 50 MG tablet Take 1-2 tablets (50-100 mg total) by mouth every 6 (six) hours as needed. 30 tablet 1  . predniSONE (DELTASONE) 10 MG tablet Take 4 tablets (40 mg total) by mouth daily with breakfast. 20 tablet 0   No facility-administered medications prior to visit.          Objective:   Physical Exam Vitals:   02/10/18 1155 02/10/18 1200  BP:  128/84  Pulse:  94  SpO2:  98%  Weight: 173 lb (78.5 kg)   Height: 5' 2.75" (1.594 m)    Gen: Pleasant, well-nourished, in no distress,  normal affect  ENT: No lesions,  mouth clear,  oropharynx clear, no postnasal drip, frequent throat clearing  Neck: No JVD, no stridor  Lungs: No use of accessory muscles, no wheeze or crackles.   Cardiovascular: RRR, heart sounds normal, no murmur or gallops, no peripheral edema  Musculoskeletal: No deformities, no cyanosis  or clubbing  Neuro: alert, non focal  Skin: Warm, no lesions or rash       Assessment & Plan:  Chronic cough Suspect that her chronic cough was impacted by GERD as she did improve some with the initiation of the Nexium.  She is also on loratadine although she minimizes her allergic symptoms.  Interestingly her ACE level is elevated in absence of any notable chest x-ray abnormality.  She is at risk for sarcoidosis, at risk for cough variant asthma.  Her pulmonary function testing today did not show any obstruction.  I believe that we need to more thoroughly workup possible obstructive lung disease, any potential evidence for sarcoidosis.   We will  perform a CT scan of the chest with contrast to better evaluate your cough and for any evidence of sarcoidosis.  We will perform a methacholine challenge breathing test to better evaluate for cough-variant asthma.  Try to avoid throat clearing.  Use a sugar-free candy (non-mentholated) to soothe your throat.  When you want to clear your throat, just swallow Continue loratadine 10 mg daily. Decrease your Nexium to 40 mg once a day.  Take this 1 hour before eating. Follow with Dr Lamonte Sakai next available after your testing is performed to review the results together  Allergic rhinitis Continue loratadine for now.  We may be able to scale back in the future  G E R D Decrease Nexium back to 40 mg daily, see if she tolerates with regard to her cough and reflux symptoms.  Baltazar Apo, MD, PhD 02/10/2018, 12:24 PM [facility] [phone] or if no answer [phone]

## 2018-02-16 ENCOUNTER — Ambulatory Visit (INDEPENDENT_AMBULATORY_CARE_PROVIDER_SITE_OTHER)
Admission: RE | Admit: 2018-02-16 | Discharge: 2018-02-16 | Disposition: A | Discharge status: Home / Self Care | Payer: 59 | Source: Ambulatory Visit | Attending: Emergency Medicine | Admitting: Emergency Medicine

## 2018-02-16 DIAGNOSIS — D869 Sarcoidosis, unspecified: Secondary | ICD-10-CM

## 2018-02-16 MED ORDER — IOPAMIDOL (ISOVUE-300) INJECTION 61%
100.0000 mL | Freq: Once | INTRAVENOUS | Status: AC | PRN
  Administered 2018-02-16: 80 mL via INTRAVENOUS

## 2018-02-22 ENCOUNTER — Ambulatory Visit (HOSPITAL_COMMUNITY)
Admission: RE | Admit: 2018-02-22 | Discharge: 2018-02-22 | Disposition: A | Discharge status: Home / Self Care | Payer: 59 | Source: Ambulatory Visit | Attending: Emergency Medicine | Admitting: Emergency Medicine

## 2018-02-22 DIAGNOSIS — J45991 Cough variant asthma: Secondary | ICD-10-CM | POA: Diagnosis present

## 2018-02-22 LAB — PULMONARY FUNCTION TEST
FEF 25-75 Post: 3.51 L/sec
FEF 25-75 Pre: 3.16 L/sec
FEF2575-%Change-Post: 11 %
FEF2575-%Pred-Post: 172 %
FEF2575-%Pred-Pre: 155 %
FEV1-%Change-Post: 1 %
FEV1-%PRED-PRE: 105 %
FEV1-%Pred-Post: 107 %
FEV1-PRE: 2.11 L
FEV1-Post: 2.15 L
FEV1FVC-%CHANGE-POST: 1 %
FEV1FVC-%Pred-Pre: 109 %
FEV6-%Change-Post: 0 %
FEV6-%PRED-PRE: 98 %
FEV6-%Pred-Post: 98 %
FEV6-PRE: 2.42 L
FEV6-Post: 2.42 L
FEV6FVC-%PRED-PRE: 104 %
FEV6FVC-%Pred-Post: 104 %
FVC-%CHANGE-POST: 0 %
FVC-%PRED-POST: 94 %
FVC-%PRED-PRE: 94 %
FVC-POST: 2.42 L
FVC-PRE: 2.42 L
POST FEV6/FVC RATIO: 100 %
Post FEV1/FVC ratio: 89 %
Pre FEV1/FVC ratio: 87 %
Pre FEV6/FVC Ratio: 100 %

## 2018-02-22 MED ORDER — METHACHOLINE 4 MG/ML NEB SOLN
2.0000 mL | Freq: Once | RESPIRATORY_TRACT | Status: AC
  Administered 2018-02-22: 8 mg via RESPIRATORY_TRACT
  Filled 2018-02-22: qty 2

## 2018-02-22 MED ORDER — ALBUTEROL SULFATE (2.5 MG/3ML) 0.083% IN NEBU
2.5000 mg | INHALATION_SOLUTION | Freq: Once | RESPIRATORY_TRACT | Status: AC
  Administered 2018-02-22: 2.5 mg via RESPIRATORY_TRACT

## 2018-02-22 MED ORDER — METHACHOLINE 0.25 MG/ML NEB SOLN
2.0000 mL | Freq: Once | RESPIRATORY_TRACT | Status: AC
  Administered 2018-02-22: 0.5 mg via RESPIRATORY_TRACT
  Filled 2018-02-22: qty 2

## 2018-02-22 MED ORDER — METHACHOLINE 1 MG/ML NEB SOLN
2.0000 mL | Freq: Once | RESPIRATORY_TRACT | Status: AC
  Administered 2018-02-22: 2 mg via RESPIRATORY_TRACT
  Filled 2018-02-22: qty 2

## 2018-02-22 MED ORDER — METHACHOLINE 16 MG/ML NEB SOLN
2.0000 mL | Freq: Once | RESPIRATORY_TRACT | Status: AC
  Administered 2018-02-22: 32 mg via RESPIRATORY_TRACT
  Filled 2018-02-22: qty 2

## 2018-02-22 MED ORDER — SODIUM CHLORIDE 0.9 % IN NEBU
3.0000 mL | INHALATION_SOLUTION | Freq: Once | RESPIRATORY_TRACT | Status: AC
Start: 2018-02-22 — End: 2018-02-22
  Administered 2018-02-22: 3 mL via RESPIRATORY_TRACT
  Filled 2018-02-22: qty 3

## 2018-02-22 MED ORDER — METHACHOLINE 0.0625 MG/ML NEB SOLN
2.0000 mL | Freq: Once | RESPIRATORY_TRACT | Status: AC
  Administered 2018-02-22: 0.125 mg via RESPIRATORY_TRACT
  Filled 2018-02-22: qty 2

## 2018-02-28 ENCOUNTER — Ambulatory Visit
Admission: RE | Admit: 2018-02-28 | Discharge: 2018-02-28 | Disposition: A | Discharge status: Home / Self Care | Payer: 59 | Source: Ambulatory Visit | Attending: Family Medicine | Admitting: Family Medicine

## 2018-02-28 DIAGNOSIS — Z139 Encounter for screening, unspecified: Secondary | ICD-10-CM

## 2018-03-16 ENCOUNTER — Ambulatory Visit (INDEPENDENT_AMBULATORY_CARE_PROVIDER_SITE_OTHER): Payer: 59 | Admitting: Family Medicine

## 2018-03-16 ENCOUNTER — Encounter: Payer: Self-pay | Admitting: Family Medicine

## 2018-03-16 VITALS — BP 118/80 | HR 100 | Temp 98.5°F | Ht 62.75 in | Wt 172.8 lb

## 2018-03-16 DIAGNOSIS — L299 Pruritus, unspecified: Secondary | ICD-10-CM | POA: Diagnosis not present

## 2018-03-16 MED ORDER — PREDNISONE 10 MG PO TABS: ORAL_TABLET | ORAL | 0 refills | Status: DC

## 2018-03-16 MED ORDER — HYDROXYZINE HCL 10 MG PO TABS: 10.0000 mg | ORAL_TABLET | Freq: Three times a day (TID) | ORAL | 0 refills | Status: DC | PRN

## 2018-03-16 NOTE — Progress Notes (Signed)
Subjective:    Patient ID: Mindy Long, female    DOB: 03-Nov-1961, 57 y.o.   MRN: [id number]  HPI Here for itching of face for 2 weeks  Does not see a rash  No redness or swelling  A little all over -worse in the face    At first noticed it when she went to bed (so she stripped mattress etc)   Taking benadryl and zyrtec   Is exp to the pollen No new animals in the house  No new products  No change in diet   Not a lot of nasal allergies   New hair extensions for hair (has not had problems before)  No chemicals however   Face wash-dove  No moisturizers  No makeup  No powders    Still has chronic cough and throat clearing  Seeing pulmonary  Planning follow up    Patient Active Problem List   Diagnosis Date Noted  . Itching 03/16/2018  . Chronic cough 12/14/2017  . Eye exam abnormal 08/04/2017  . Thoracic back pain 09/06/2015  . Tinnitus 11/13/2014  . History of shingles 04/27/2014  . Back pain 04/20/2014  . OSA (obstructive sleep apnea) 09/27/2012  . Snoring 06/21/2012  . Fatigue 06/20/2012  . BPPV (benign paroxysmal positional vertigo) 12/14/2011  . Colon cancer screening 11/04/2011  . Routine general medical examination at a health care facility 10/22/2011  . Chronic foot pain 09/11/2011  . PLANTAR FASCIITIS 09/05/2010  . Vitamin D deficiency 07/02/2010  . DISTURBANCE OF SKIN SENSATION 04/07/2010  . PALPITATIONS 04/07/2010  . HYPOKALEMIA 07/10/2009  . PERIMENOPAUSAL STATUS 06/20/2009  . Allergic rhinitis 04/03/2008  . Essential hypertension 01/25/2008  . G E R D 07/05/2007  . PEPTIC ULCER DISEASE 07/05/2007  . Headache, chronic daily 07/05/2007  . NEPHROLITHIASIS, HX OF 07/05/2007   Past Medical History:  Diagnosis Date  . Back pain   . GERD (gastroesophageal reflux disease)   . Headache(784.0)   . History of nephrolithiasis   . Hypertension   . Plantar fasciitis   . Sleep apnea   . Ulcer 1980's   peptic ulcer disease   Past Surgical  History:  Procedure Laterality Date  . ABDOMINAL HYSTERECTOMY  2002  . CHOLECYSTECTOMY  1984  . COLONOSCOPY  12/18/2011   Normal  . OOPHORECTOMY     Social History   Tobacco Use  . Smoking status: Never Smoker  . Smokeless tobacco: Never Used  Substance Use Topics  . Alcohol use: No    Alcohol/week: 0.0 oz  . Drug use: No   Family History  Problem Relation Age of Onset  . Stroke Father   . Hypertension Father   . Diabetes Father   . Cancer Brother        stomach tumor   Allergies  Allergen Reactions  . Atenolol Cough  . Hydrocodone Nausea Only   Current Outpatient Medications on File Prior to Visit  Medication Sig Dispense Refill  . amLODipine (NORVASC) 5 MG tablet TAKE 1 TABLET (5 MG TOTAL) BY MOUTH DAILY. 90 tablet 3  . celecoxib (CELEBREX) 200 MG capsule TAKE 1 CAPSULE (200 MG TOTAL) BY MOUTH DAILY AS NEEDED. 90 capsule 0  . Cholecalciferol (VITAMIN D3) 2000 UNITS capsule Take 2,000 Units by mouth daily.      Marland Kitchen esomeprazole (NEXIUM) 20 MG capsule Take 1 capsule (20 mg total) by mouth 2 (two) times daily before a meal. (Patient taking differently: Take 20 mg by mouth 2 (two) times daily as needed. )  180 capsule 3  . gabapentin (NEURONTIN) 300 MG capsule Take 300 mg by mouth 3 (three) times daily.    . ondansetron (ZOFRAN) 4 MG tablet Take 1 tablet (4 mg total) by mouth every 8 (eight) hours as needed. 20 tablet 0  . traMADol (ULTRAM) 50 MG tablet Take 1-2 tablets (50-100 mg total) by mouth every 6 (six) hours as needed. 30 tablet 1   No current facility-administered medications on file prior to visit.      Review of Systems  Constitutional: Positive for fatigue. Negative for activity change, appetite change, fever and unexpected weight change.  HENT: Negative for congestion, ear pain, rhinorrhea, sinus pressure and sore throat.   Eyes: Negative for pain, redness and visual disturbance.  Respiratory: Positive for cough. Negative for shortness of breath, wheezing and  stridor.   Cardiovascular: Negative for chest pain and palpitations.  Gastrointestinal: Negative for abdominal pain, blood in stool, constipation and diarrhea.  Endocrine: Negative for polydipsia and polyuria.  Genitourinary: Negative for dysuria, frequency and urgency.  Musculoskeletal: Negative for arthralgias, back pain and myalgias.  Skin: Negative for pallor, rash and wound.       Itching of face without rash   Allergic/Immunologic: Negative for environmental allergies.  Neurological: Negative for dizziness, syncope and headaches.  Hematological: Negative for adenopathy. Does not bruise/bleed easily.  Psychiatric/Behavioral: Negative for decreased concentration and dysphoric mood. The patient is not nervous/anxious.        Objective:   Physical Exam  Constitutional: She appears well-developed and well-nourished. No distress.  obese and well appearing   HENT:  Head: Normocephalic and atraumatic.  Mouth/Throat: Oropharynx is clear and moist.  Clears throat very frequently  Eyes: Pupils are equal, round, and reactive to light. Conjunctivae and EOM are normal.  Neck: Normal range of motion. Neck supple. No JVD present. Carotid bruit is not present. No thyromegaly present.  Cardiovascular: Normal rate, regular rhythm, normal heart sounds and intact distal pulses. Exam reveals no gallop.  Pulmonary/Chest: Effort normal and breath sounds normal. No respiratory distress. She has no wheezes. She has no rales.  No crackles  Abdominal: She exhibits no abdominal bruit.  Musculoskeletal: She exhibits no edema or tenderness.  Lymphadenopathy:    She has no cervical adenopathy.  Neurological: She is alert. She has normal reflexes. No cranial nerve deficit.  Skin: Skin is warm and dry. No rash noted. No erythema. No pallor.  No rash or excoriations of face   No urticaria or rash   Psychiatric: She has a normal mood and affect.  Seems stressed /fatigued Pleasant           Assessment  & Plan:   Problem List Items Addressed This Visit      Other   Itching    Facial itching w/o rash  Disc poss allergy/exposures (also has chronic cough and throat clearing w/u by pulm) Keep cool Avoid chemicals  Trial of prednisone taper 30 mg (disc side eff) Hydroxyzine qhs caution of sedation  Update if not starting to improve in a week or if worsening  Or if any rash develops

## 2018-03-16 NOTE — Patient Instructions (Addendum)
Hold celebrex for 3-4 days to see if you still have itching   Be careful about exposure to heat and sun -they make itch worse   Use cold compresses and cold water rinse for itch if needed   Keep track of chemical exposures and new foods/etc Avoid excess color and fragrance in products   Take the prednisone as directed -to see if it helps itchy  Try the hydroxyzine at bedtime for itch and help sleeping

## 2018-03-16 NOTE — Assessment & Plan Note (Signed)
Facial itching w/o rash  Disc poss allergy/exposures (also has chronic cough and throat clearing w/u by pulm) Keep cool Avoid chemicals  Trial of prednisone taper 30 mg (disc side eff) Hydroxyzine qhs caution of sedation  Update if not starting to improve in a week or if worsening  Or if any rash develops

## 2018-04-01 ENCOUNTER — Telehealth: Payer: Self-pay | Admitting: Family Medicine

## 2018-04-01 DIAGNOSIS — L299 Pruritus, unspecified: Secondary | ICD-10-CM

## 2018-04-01 NOTE — Telephone Encounter (Signed)
Copied from [place] [id number]. Topic: Referral - Request >> Apr 01, 2018 10:03 AM Margot Ables wrote: Reason for CRM: pt called stating she is still having itching on face. She has taken the medications recommended and it is not getting better. Pt is requesting referral to dermatology. Prefers somewhere in [place]. Please advise.

## 2018-04-01 NOTE — Telephone Encounter (Signed)
Ref done  Will route to [facility] 

## 2018-04-01 NOTE — Telephone Encounter (Signed)
Pt last seen 03/16/18.Please advise.

## 2018-04-08 ENCOUNTER — Encounter: Payer: Self-pay | Admitting: Emergency Medicine

## 2018-04-08 ENCOUNTER — Ambulatory Visit (INDEPENDENT_AMBULATORY_CARE_PROVIDER_SITE_OTHER): Payer: 59 | Admitting: Emergency Medicine

## 2018-04-08 DIAGNOSIS — J301 Allergic rhinitis due to pollen: Secondary | ICD-10-CM

## 2018-04-08 DIAGNOSIS — R05 Cough: Secondary | ICD-10-CM | POA: Diagnosis not present

## 2018-04-08 DIAGNOSIS — R748 Abnormal levels of other serum enzymes: Secondary | ICD-10-CM

## 2018-04-08 DIAGNOSIS — R053 Chronic cough: Secondary | ICD-10-CM

## 2018-04-08 NOTE — Assessment & Plan Note (Signed)
Denies sx currently, not on meds. ? A contributor to her cough

## 2018-04-08 NOTE — Progress Notes (Signed)
Subjective:    Patient ID: Mindy Long, female    DOB: 07-13-1961, 57 y.o.   MRN: [id number]   Cough   Pertinent negatives include no ear pain, eye redness, fever, headaches, postnasal drip, rash, rhinorrhea, sore throat, shortness of breath or wheezing. Her past medical history is significant for asthma.   Asthma   She complains of cough. There is no shortness of breath or wheezing. Pertinent negatives include no ear pain, fever, headaches, postnasal drip, rhinorrhea, sneezing, sore throat or trouble swallowing. Her past medical history is significant for asthma.   57 year old woman, never smoker, with a history of obstructive sleep apnea seen here in the past by Dr. Gwenette Greet.  Also with GERD, hypertension.  She is referred today for evaluation of chronic cough.  She tells me that she has been coughing since October 2018. She was well, then caught a URI +/- bronchitis, was treated first w azithro. Cough persisted so she was given benzonatate, has used OTC cough syrup. May have improved some. She may have had another URI in December, flared things up again. She has continued to cough. CXR done 12/23/17, reviewed, normal. She has nexium, but has not used it much during this time. Lots of throat clearing. Cough is non-productive. Minimal congestion. She has noticed a sensitivity to perfume, causes cough.   Her aunt has sarcoidosis.   ROV 02/10/18 --patient follows up today for her chronic cough.  She is a never smoker with a history of sleep apnea.  She has hypertension and GERD, seasonal allergies.  She had a normal chest x-ray 12/22/17.  At her last visit I added loratadine, Nexium twice daily.  I also gave her 5 days of prednisone.  She reports today.   She underwent pulmonary function testing today that I have personally reviewed.  This shows some evidence for restriction on her lung volumes.  Spirometry shows normal FEV1 and FVC but the ratio is elevated at 89%.  Diffusion capacity is normal.  Her  flow volume loop is normal.   IgE 2/6 >> 9, ACE level 2/6 94, absolute eosinophils 2/6 >> 100/mcL  ROV 04/08/18 --this is a follow-up visit for patient with a history of chronic cough in the setting of GERD and allergic rhinitis.  She also has an elevated ACE level in absence of any radiographical evidence for sarcoidosis.  We performed a CT scan of her chest 02/17/2018 that I have reviewed..  This does not show any evidence for sarcoidosis.  There were some small scattered subpleural scars noted.  No adenopathy. We performed a methacholine challenge that was done on 02/22/2018 that I have reviewed.  This does not show any evidence of methacholine hyperreactivity.  This does not support asthma. She is still having some cough - it was better until about 2 weeks ago. Mainly at night. She is using nexium prn, at night for burning. She has had some facial itching on her nose.    Review of Systems  Constitutional: Negative for fever and unexpected weight change.  HENT: Negative for congestion, dental problem, ear pain, nosebleeds, postnasal drip, rhinorrhea, sinus pressure, sneezing, sore throat and trouble swallowing.   Eyes: Negative for redness and itching.  Respiratory: Positive for cough. Negative for chest tightness, shortness of breath and wheezing.   Cardiovascular: Negative for palpitations and leg swelling.  Gastrointestinal: Negative for nausea and vomiting.  Genitourinary: Negative for dysuria.  Musculoskeletal: Negative for joint swelling.  Skin: Negative for rash.  Neurological: Negative for headaches.  Hematological: Does not bruise/bleed easily.  Psychiatric/Behavioral: Negative for dysphoric mood. The patient is not nervous/anxious.     Past Medical History:  Diagnosis Date  . Back pain   . GERD (gastroesophageal reflux disease)   . Headache(784.0)   . History of nephrolithiasis   . Hypertension   . Plantar fasciitis   . Sleep apnea   . Ulcer 1980's   peptic ulcer disease       Family History  Problem Relation Age of Onset  . Stroke Father   . Hypertension Father   . Diabetes Father   . Cancer Brother        stomach tumor     Social History   Socioeconomic History  . Marital status: Married    Spouse name: Not on file  . Number of children: Not on file  . Years of education: Not on file  . Highest education level: Not on file  Occupational History  . Occupation: ar specialist  Social Needs  . Financial resource strain: Not on file  . Food insecurity:    Worry: Not on file    Inability: Not on file  . Transportation needs:    Medical: Not on file    Non-medical: Not on file  Tobacco Use  . Smoking status: Never Smoker  . Smokeless tobacco: Never Used  Substance and Sexual Activity  . Alcohol use: No    Alcohol/week: 0.0 oz  . Drug use: No  . Sexual activity: Not on file  Lifestyle  . Physical activity:    Days per week: Not on file    Minutes per session: Not on file  . Stress: Not on file  Relationships  . Social connections:    Talks on phone: Not on file    Gets together: Not on file    Attends religious service: Not on file    Active member of club or organization: Not on file    Attends meetings of clubs or organizations: Not on file    Relationship status: Not on file  . Intimate partner violence:    Fear of current or ex partner: Not on file    Emotionally abused: Not on file    Physically abused: Not on file    Forced sexual activity: Not on file  Other Topics Concern  . Not on file  Social History Narrative   Walks for exercise  has worked in an office.   Allergies  Allergen Reactions  . Atenolol Cough  . Hydrocodone Nausea Only     Outpatient Medications Prior to Visit  Medication Sig Dispense Refill  . amLODipine (NORVASC) 5 MG tablet TAKE 1 TABLET (5 MG TOTAL) BY MOUTH DAILY. 90 tablet 3  . celecoxib (CELEBREX) 200 MG capsule TAKE 1 CAPSULE (200 MG TOTAL) BY MOUTH DAILY AS NEEDED. 90 capsule 0  .  Cholecalciferol (VITAMIN D3) 2000 UNITS capsule Take 2,000 Units by mouth daily.      Marland Kitchen esomeprazole (NEXIUM) 20 MG capsule Take 1 capsule (20 mg total) by mouth 2 (two) times daily before a meal. (Patient taking differently: Take 20 mg by mouth 2 (two) times daily as needed. ) 180 capsule 3  . gabapentin (NEURONTIN) 300 MG capsule Take 300 mg by mouth 3 (three) times daily.    . hydrOXYzine (ATARAX/VISTARIL) 10 MG tablet Take 1 tablet (10 mg total) by mouth 3 (three) times daily as needed. 30 tablet 0  . ondansetron (ZOFRAN) 4 MG tablet Take 1 tablet (4 mg  total) by mouth every 8 (eight) hours as needed. 20 tablet 0  . traMADol (ULTRAM) 50 MG tablet Take 1-2 tablets (50-100 mg total) by mouth every 6 (six) hours as needed. 30 tablet 1  . predniSONE (DELTASONE) 10 MG tablet Take 3 pills once daily by mouth for 3 days, then 2 pills once daily for 3 days, then 1 pill once daily for 3 days and then stop 18 tablet 0   No facility-administered medications prior to visit.          Objective:   Physical Exam Vitals:   04/08/18 1103 04/08/18 1104  BP:  108/74  Pulse:  74  SpO2:  98%  Weight: 175 lb (79.4 kg)   Height: 5' 2.75" (1.594 m)    Gen: Pleasant, well-nourished, in no distress,  normal affect  ENT: No lesions,  mouth clear,  oropharynx clear, no postnasal drip, frequent throat clearing  Neck: No JVD, no stridor  Lungs: No use of accessory muscles, no wheeze or crackles.   Cardiovascular: RRR, heart sounds normal, no murmur or gallops, no peripheral edema  Musculoskeletal: No deformities, no cyanosis or clubbing  Neuro: alert, non focal  Skin: Warm, no lesions or rash       Assessment & Plan:  Allergic rhinitis Denies sx currently, not on meds. ? A contributor to her cough  Chronic cough Better but still present, no evidence asthma on her methacholine. ? undertreating her GERD.    Try starting your Nexium every day to see if this impacts your cough, decreases the  frequency of your cough.  Abnormal serum ACE level But no clear skin or pulmonary manifestations of sarcoidosis.  I believe at this point we need to continue surveillance, need to repeat her chest x-ray annually.  Depending on clinical findings, chest x-ray will decide when she may need a repeat CT scan of the chest.  I have asked her to keep watch for possible rash and if present then we would consider biopsy.  Your CT scan of the chest does not show any evidence of active pulmonary sarcoidosis. Your pulmonary function testing and methacholine challenge testing do not show evidence for asthma.  This is good news. Given your lab work we should probably continue to do surveillance for any possible evolving symptoms consistent with sarcoidosis.  We will do a chest x-ray annually and you should keep track of whether you develop any skin rash.  Baltazar Apo, MD, PhD 04/08/2018, 11:37 AM [facility] [phone] or if no answer [phone]

## 2018-04-08 NOTE — Assessment & Plan Note (Signed)
But no clear skin or pulmonary manifestations of sarcoidosis.  I believe at this point we need to continue surveillance, need to repeat her chest x-ray annually.  Depending on clinical findings, chest x-ray will decide when she may need a repeat CT scan of the chest.  I have asked her to keep watch for possible rash and if present then we would consider biopsy.  Your CT scan of the chest does not show any evidence of active pulmonary sarcoidosis. Your pulmonary function testing and methacholine challenge testing do not show evidence for asthma.  This is good news. Given your lab work we should probably continue to do surveillance for any possible evolving symptoms consistent with sarcoidosis.  We will do a chest x-ray annually and you should keep track of whether you develop any skin rash.

## 2018-04-08 NOTE — Patient Instructions (Signed)
Your CT scan of the chest does not show any evidence of active pulmonary sarcoidosis. Your pulmonary function testing and methacholine challenge testing do not show evidence for asthma.  This is good news. Given your lab work we should probably continue to do surveillance for any possible evolving symptoms consistent with sarcoidosis.  We will do a chest x-ray annually and you should keep track of whether you develop any skin rash. Try starting your Nexium every day to see if this impacts your cough, decreases the frequency of your cough. Follow with Dr Lamonte Sakai in 12 months with a CXR, or sooner if you have any problems

## 2018-04-08 NOTE — Assessment & Plan Note (Signed)
Better but still present, no evidence asthma on her methacholine. ? undertreating her GERD.    Try starting your Nexium every day to see if this impacts your cough, decreases the frequency of your cough.

## 2018-04-18 ENCOUNTER — Telehealth: Payer: Self-pay

## 2018-04-18 NOTE — Telephone Encounter (Signed)
Helene Kelp with [facility] said pt has appt today with specialist and does not know name of dr or location of office. Advised Dr Rozann Lesches at [place]. [place] [phone]. Helene Kelp will let pt know.

## 2018-08-10 ENCOUNTER — Telehealth: Payer: Self-pay | Admitting: Family Medicine

## 2018-08-10 DIAGNOSIS — I10 Essential (primary) hypertension: Secondary | ICD-10-CM

## 2018-08-10 DIAGNOSIS — Z0000 Encounter for general adult medical examination without abnormal findings: Secondary | ICD-10-CM

## 2018-08-10 DIAGNOSIS — E559 Vitamin D deficiency, unspecified: Secondary | ICD-10-CM

## 2018-08-10 NOTE — Telephone Encounter (Signed)
-----   Message from Ellamae Sia sent at 08/10/2018  8:54 AM EDT ----- Regarding: Lab orders for Thursday, 9.5.19 Patient is scheduled for CPX labs, please order future labs, Thanks , [name]

## 2018-08-11 ENCOUNTER — Other Ambulatory Visit (INDEPENDENT_AMBULATORY_CARE_PROVIDER_SITE_OTHER): Payer: 59

## 2018-08-11 DIAGNOSIS — E559 Vitamin D deficiency, unspecified: Secondary | ICD-10-CM | POA: Diagnosis not present

## 2018-08-11 DIAGNOSIS — I10 Essential (primary) hypertension: Secondary | ICD-10-CM

## 2018-08-11 LAB — COMPREHENSIVE METABOLIC PANEL
ALT: 14 U/L (ref 0–35)
AST: 16 U/L (ref 0–37)
Albumin: 3.8 g/dL (ref 3.5–5.2)
Alkaline Phosphatase: 83 U/L (ref 39–117)
BUN: 13 mg/dL (ref 6–23)
CALCIUM: 9 mg/dL (ref 8.4–10.5)
CO2: 28 meq/L (ref 19–32)
CREATININE: 0.87 mg/dL (ref 0.40–1.20)
Chloride: 105 mEq/L (ref 96–112)
GFR: 85.64 mL/min (ref >60.00)
GLUCOSE: 91 mg/dL (ref 70–99)
Potassium: 3.6 mEq/L (ref 3.5–5.1)
SODIUM: 139 meq/L (ref 135–145)
Total Bilirubin: 0.3 mg/dL (ref 0.2–1.2)
Total Protein: 7 g/dL (ref 6.0–8.3)

## 2018-08-11 LAB — CBC WITH DIFFERENTIAL/PLATELET
BASOS ABS: 0 10*3/uL (ref 0.0–0.1)
BASOS PCT: 0.6 % (ref 0.0–3.0)
EOS ABS: 0.1 10*3/uL (ref 0.0–0.7)
Eosinophils Relative: 2.5 % (ref 0.0–5.0)
HCT: 40.7 % (ref 36.0–46.0)
Hemoglobin: 13.2 g/dL (ref 12.0–15.0)
LYMPHS ABS: 2.1 10*3/uL (ref 0.7–4.0)
Lymphocytes Relative: 41 % (ref 12.0–46.0)
MCHC: 32.5 g/dL (ref 30.0–36.0)
MCV: 76.9 fl — ABNORMAL LOW (ref 78.0–100.0)
Monocytes Absolute: 0.4 10*3/uL (ref 0.1–1.0)
Monocytes Relative: 7.3 % (ref 3.0–12.0)
NEUTROS ABS: 2.5 10*3/uL (ref 1.4–7.7)
NEUTROS PCT: 48.6 % (ref 43.0–77.0)
PLATELETS: 236 10*3/uL (ref 150.0–400.0)
RBC: 5.29 Mil/uL — ABNORMAL HIGH (ref 3.87–5.11)
RDW: 14.3 % (ref 11.5–15.5)
WBC: 5.1 10*3/uL (ref 4.0–10.5)

## 2018-08-11 LAB — TSH: TSH: 1.42 u[IU]/mL (ref 0.35–4.50)

## 2018-08-11 LAB — LIPID PANEL
CHOL/HDL RATIO: 2
Cholesterol: 181 mg/dL (ref 0–200)
HDL: 75.3 mg/dL (ref >39.00)
LDL Cholesterol: 96 mg/dL (ref 0–99)
NONHDL: 105.63
TRIGLYCERIDES: 49 mg/dL (ref 0.0–149.0)
VLDL: 9.8 mg/dL (ref 0.0–40.0)

## 2018-08-11 LAB — VITAMIN D 25 HYDROXY (VIT D DEFICIENCY, FRACTURES): VITD: 36.38 ng/mL (ref 30.00–100.00)

## 2018-08-12 ENCOUNTER — Other Ambulatory Visit: Payer: 59

## 2018-08-15 ENCOUNTER — Ambulatory Visit (INDEPENDENT_AMBULATORY_CARE_PROVIDER_SITE_OTHER): Payer: 59 | Admitting: Family Medicine

## 2018-08-15 ENCOUNTER — Encounter: Payer: Self-pay | Admitting: Family Medicine

## 2018-08-15 VITALS — BP 126/72 | HR 93 | Temp 98.4°F | Ht 62.25 in | Wt 171.0 lb

## 2018-08-15 DIAGNOSIS — Z23 Encounter for immunization: Secondary | ICD-10-CM

## 2018-08-15 DIAGNOSIS — H9311 Tinnitus, right ear: Secondary | ICD-10-CM

## 2018-08-15 DIAGNOSIS — Z0000 Encounter for general adult medical examination without abnormal findings: Secondary | ICD-10-CM | POA: Diagnosis not present

## 2018-08-15 DIAGNOSIS — M5440 Lumbago with sciatica, unspecified side: Secondary | ICD-10-CM | POA: Diagnosis not present

## 2018-08-15 DIAGNOSIS — E559 Vitamin D deficiency, unspecified: Secondary | ICD-10-CM | POA: Diagnosis not present

## 2018-08-15 DIAGNOSIS — R053 Chronic cough: Secondary | ICD-10-CM

## 2018-08-15 DIAGNOSIS — I10 Essential (primary) hypertension: Secondary | ICD-10-CM

## 2018-08-15 DIAGNOSIS — M79673 Pain in unspecified foot: Secondary | ICD-10-CM

## 2018-08-15 DIAGNOSIS — G8929 Other chronic pain: Secondary | ICD-10-CM

## 2018-08-15 DIAGNOSIS — R05 Cough: Secondary | ICD-10-CM

## 2018-08-15 MED ORDER — AMLODIPINE BESYLATE 5 MG PO TABS: ORAL_TABLET | ORAL | 3 refills | Status: DC

## 2018-08-15 MED ORDER — ESOMEPRAZOLE MAGNESIUM 20 MG PO CPDR: 20.0000 mg | DELAYED_RELEASE_CAPSULE | Freq: Two times a day (BID) | ORAL | 3 refills | Status: DC

## 2018-08-15 NOTE — Progress Notes (Signed)
Subjective:    Patient ID: Mindy Long, female    DOB: 05-11-1961, 57 y.o.   MRN: [id number]  HPI Here for health maintenance exam and to review chronic medical problems    Nothing new going on  Feels ok overall  Chronic cough is improved   Has recurrent rash on R foot- it peels around ankle- then gets better and then comes back but does not itch   Tinnitus is louder - she saw ENT Was watching carotid artery     Wt Readings from Last 3 Encounters:  08/15/18 171 lb (77.6 kg)  04/08/18 175 lb (79.4 kg)  03/16/18 172 lb 12 oz (78.4 kg)  down 4 lb since may  Walking regularly (it does bother her low back)  Rides the bike at the gym when she can  Changed her diet -  More vegetables / less pasta (grew a successful garden)  31.03 kg/m   Pap 6/09-then total hysterectomy   No gyn problems or symptoms  Some hot flashes- doing ok /dealing with them   Flu shot - will do today  Mammogram 3/19  Self breast exam   Tetanus shot 7/10   Zoster status -had shingles years ago   Colonoscopy 1/13- 10 years recall  No fam hx of colon cancer   HIV screen 8/17 neg Hep C screen 8/18 neg   H/o low vit D - level is 36.3  bp is stable today  No cp or palpitations or headaches or edema  No side effects to medicines  BP Readings from Last 3 Encounters:  08/15/18 126/72  04/08/18 108/74  03/16/18 118/80      Cholesterol Lab Results  Component Value Date   CHOL 181 08/11/2018   CHOL 194 07/27/2017   CHOL 210 (H) 07/21/2016   Lab Results  Component Value Date   HDL 75.30 08/11/2018   HDL 84.30 07/27/2017   HDL 87.60 07/21/2016   Lab Results  Component Value Date   LDLCALC 96 08/11/2018   LDLCALC 97 07/27/2017   LDLCALC 115 (H) 07/21/2016   Lab Results  Component Value Date   TRIG 49.0 08/11/2018   TRIG 63.0 07/27/2017   TRIG 39.0 07/21/2016   Lab Results  Component Value Date   CHOLHDL 2 08/11/2018   CHOLHDL 2 07/27/2017   CHOLHDL 2 07/21/2016   Lab  Results  Component Value Date   LDLDIRECT 126.7 10/23/2011   LDLDIRECT 132.9 06/20/2009   LDLDIRECT 109.4 01/25/2008  good profile   Glucose is 91  Sees opthy- there was ? Of retinal change in the past  No vision change that she has noticed   Other labs Results for orders placed or performed in visit on 08/11/18  VITAMIN D 25 Hydroxy (Vit-D Deficiency, Fractures)  Result Value Ref Range   VITD 36.38 30.00 - 100.00 ng/mL  TSH  Result Value Ref Range   TSH 1.42 0.35 - 4.50 uIU/mL  Lipid panel  Result Value Ref Range   Cholesterol 181 0 - 200 mg/dL   Triglycerides 49.0 0.0 - 149.0 mg/dL   HDL 75.30 >39.00 mg/dL   VLDL 9.8 0.0 - 40.0 mg/dL   LDL Cholesterol 96 0 - 99 mg/dL   Total CHOL/HDL Ratio 2    NonHDL 105.63   Comprehensive metabolic panel  Result Value Ref Range   Sodium 139 135 - 145 mEq/L   Potassium 3.6 3.5 - 5.1 mEq/L   Chloride 105 96 - 112 mEq/L   CO2 28 19 -  32 mEq/L   Glucose, Bld 91 70 - 99 mg/dL   BUN 13 6 - 23 mg/dL   Creatinine, Ser 0.87 0.40 - 1.20 mg/dL   Total Bilirubin 0.3 0.2 - 1.2 mg/dL   Alkaline Phosphatase 83 39 - 117 U/L   AST 16 0 - 37 U/L   ALT 14 0 - 35 U/L   Total Protein 7.0 6.0 - 8.3 g/dL   Albumin 3.8 3.5 - 5.2 g/dL   Calcium 9.0 8.4 - 10.5 mg/dL   GFR 85.64 >60.00 mL/min  CBC with Differential/Platelet  Result Value Ref Range   WBC 5.1 4.0 - 10.5 K/uL   RBC 5.29 (H) 3.87 - 5.11 Mil/uL   Hemoglobin 13.2 12.0 - 15.0 g/dL   HCT 40.7 36.0 - 46.0 %   MCV 76.9 (L) 78.0 - 100.0 fl   MCHC 32.5 30.0 - 36.0 g/dL   RDW 14.3 11.5 - 15.5 %   Platelets 236.0 150.0 - 400.0 K/uL   Neutrophils Relative % 48.6 43.0 - 77.0 %   Lymphocytes Relative 41.0 12.0 - 46.0 %   Monocytes Relative 7.3 3.0 - 12.0 %   Eosinophils Relative 2.5 0.0 - 5.0 %   Basophils Relative 0.6 0.0 - 3.0 %   Neutro Abs 2.5 1.4 - 7.7 K/uL   Lymphs Abs 2.1 0.7 - 4.0 K/uL   Monocytes Absolute 0.4 0.1 - 1.0 K/uL   Eosinophils Absolute 0.1 0.0 - 0.7 K/uL   Basophils  Absolute 0.0 0.0 - 0.1 K/uL    Glucose reassuring at 91   Patient Active Problem List   Diagnosis Date Noted  . Abnormal serum ACE level 04/08/2018  . Itching 03/16/2018  . Chronic cough 12/14/2017  . Eye exam abnormal 08/04/2017  . Thoracic back pain 09/06/2015  . Tinnitus 11/13/2014  . History of shingles 04/27/2014  . Back pain 04/20/2014  . OSA (obstructive sleep apnea) 09/27/2012  . Snoring 06/21/2012  . Fatigue 06/20/2012  . BPPV (benign paroxysmal positional vertigo) 12/14/2011  . Colon cancer screening 11/04/2011  . Routine general medical examination at a health care facility 10/22/2011  . Chronic foot pain 09/11/2011  . PLANTAR FASCIITIS 09/05/2010  . Vitamin D deficiency 07/02/2010  . DISTURBANCE OF SKIN SENSATION 04/07/2010  . PALPITATIONS 04/07/2010  . HYPOKALEMIA 07/10/2009  . PERIMENOPAUSAL STATUS 06/20/2009  . Allergic rhinitis 04/03/2008  . Essential hypertension 01/25/2008  . G E R D 07/05/2007  . PEPTIC ULCER DISEASE 07/05/2007  . Headache, chronic daily 07/05/2007  . NEPHROLITHIASIS, HX OF 07/05/2007   Past Medical History:  Diagnosis Date  . Back pain   . GERD (gastroesophageal reflux disease)   . Headache(784.0)   . History of nephrolithiasis   . Hypertension   . Plantar fasciitis   . Sleep apnea   . Ulcer 1980's   peptic ulcer disease   Past Surgical History:  Procedure Laterality Date  . ABDOMINAL HYSTERECTOMY  2002  . CHOLECYSTECTOMY  1984  . COLONOSCOPY  12/18/2011   Normal  . OOPHORECTOMY     Social History   Tobacco Use  . Smoking status: Never Smoker  . Smokeless tobacco: Never Used  Substance Use Topics  . Alcohol use: No    Alcohol/week: 0.0 standard drinks  . Drug use: No   Family History  Problem Relation Age of Onset  . Stroke Father   . Hypertension Father   . Diabetes Father   . Cancer Brother        stomach tumor  Allergies  Allergen Reactions  . Atenolol Cough  . Hydrocodone Nausea Only   Current  Outpatient Medications on File Prior to Visit  Medication Sig Dispense Refill  . celecoxib (CELEBREX) 200 MG capsule TAKE 1 CAPSULE (200 MG TOTAL) BY MOUTH DAILY AS NEEDED. 90 capsule 0  . Cholecalciferol (VITAMIN D3) 2000 UNITS capsule Take 2,000 Units by mouth daily.      Marland Kitchen gabapentin (NEURONTIN) 300 MG capsule Take 300 mg by mouth 3 (three) times daily.    . hydrOXYzine (ATARAX/VISTARIL) 10 MG tablet Take 1 tablet (10 mg total) by mouth 3 (three) times daily as needed. 30 tablet 0  . ondansetron (ZOFRAN) 4 MG tablet Take 1 tablet (4 mg total) by mouth every 8 (eight) hours as needed. 20 tablet 0  . traMADol (ULTRAM) 50 MG tablet Take 1-2 tablets (50-100 mg total) by mouth every 6 (six) hours as needed. 30 tablet 1   No current facility-administered medications on file prior to visit.     Review of Systems  Constitutional: Negative for activity change, appetite change, fatigue, fever and unexpected weight change.  HENT: Negative for congestion, ear pain, rhinorrhea, sinus pressure and sore throat.   Eyes: Negative for pain, redness and visual disturbance.  Respiratory: Negative for cough, shortness of breath and wheezing.   Cardiovascular: Negative for chest pain and palpitations.  Gastrointestinal: Negative for abdominal pain, blood in stool, constipation and diarrhea.  Endocrine: Negative for polydipsia and polyuria.  Genitourinary: Negative for dysuria, frequency and urgency.  Musculoskeletal: Negative for arthralgias, back pain and myalgias.  Skin: Negative for pallor and rash.       Rash on R ankle  Allergic/Immunologic: Negative for environmental allergies.  Neurological: Negative for dizziness, syncope and headaches.  Hematological: Negative for adenopathy. Does not bruise/bleed easily.  Psychiatric/Behavioral: Negative for decreased concentration and dysphoric mood. The patient is not nervous/anxious.        Objective:   Physical Exam  Constitutional: She appears  well-developed and well-nourished. No distress.  obese and well appearing   HENT:  Head: Normocephalic and atraumatic.  Right Ear: External ear normal.  Left Ear: External ear normal.  Mouth/Throat: Oropharynx is clear and moist.  Eyes: Pupils are equal, round, and reactive to light. Conjunctivae and EOM are normal. No scleral icterus.  Neck: Normal range of motion. Neck supple. No JVD present. Carotid bruit is not present. No thyromegaly present.  Cardiovascular: Normal rate, regular rhythm, normal heart sounds and intact distal pulses. Exam reveals no gallop.  Pulmonary/Chest: Effort normal and breath sounds normal. No respiratory distress. She has no wheezes. She exhibits no tenderness. No breast tenderness, discharge or bleeding.  Abdominal: Soft. Bowel sounds are normal. She exhibits no distension, no abdominal bruit and no mass. There is no tenderness.  Genitourinary: No breast tenderness, discharge or bleeding.  Genitourinary Comments: Breast exam: No mass, nodules, thickening, tenderness, bulging, retraction, inflamation, nipple discharge or skin changes noted.  No axillary or clavicular LA.      Musculoskeletal: Normal range of motion. She exhibits no edema or tenderness.  Lymphadenopathy:    She has no cervical adenopathy.  Neurological: She is alert. She has normal reflexes. She displays normal reflexes. No cranial nerve deficit. She exhibits normal muscle tone. Coordination normal.  Skin: Skin is warm and dry. No rash noted. No erythema. No pallor.  2 by 3 cm area of peeling R medial ankle  No vesicle or rash or plaque Skin is dry   Psychiatric: She has a  normal mood and affect.  Pleasant           Assessment & Plan:   Problem List Items Addressed This Visit      Cardiovascular and Mediastinum   Essential hypertension    bp is stable today  No cp or palpitations or headaches or edema  No side effects to medicines  BP Readings from Last 3 Encounters:  08/15/18  126/72  04/08/18 108/74  03/16/18 118/80          Relevant Medications   amLODipine (NORVASC) 5 MG tablet     Other   Back pain    Keeps her from walking long distances  Rides bike  Gabapentin  Tramadol prn with caution       Chronic cough    Some improvement with tx of GERD  nexium bid       Chronic foot pain    Gabapentin and prn tramadol are helpful      Routine general medical examination at a health care facility - Primary    Reviewed health habits including diet and exercise and skin cancer prevention Reviewed appropriate screening tests for age  Also reviewed health mt list, fam hx and immunization status , as well as social and family history   See HPI Labs reviewed  Flu shot today Labs rev  F/u with opthy as planned  Also ENT for tinnitus  otc cortisone cream for dermatitis on ankle       Tinnitus    Ongoing R ear -worsening  Plans to f/u with ENT Did have carotids checked        Vitamin D deficiency    D level in mid 30s with otc D3 Disc imp to bone and overall health       Other Visit Diagnoses    Need for influenza vaccination       Relevant Orders   Flu Vaccine QUAD 6+ mos PF IM (Fluarix Quad PF) (Completed)

## 2018-08-15 NOTE — Assessment & Plan Note (Signed)
Reviewed health habits including diet and exercise and skin cancer prevention Reviewed appropriate screening tests for age  Also reviewed health mt list, fam hx and immunization status , as well as social and family history   See HPI Labs reviewed  Flu shot today Labs rev  F/u with opthy as planned  Also ENT for tinnitus  otc cortisone cream for dermatitis on ankle

## 2018-08-15 NOTE — Assessment & Plan Note (Signed)
Keeps her from walking long distances  Rides bike  Gabapentin  Tramadol prn with caution

## 2018-08-15 NOTE — Assessment & Plan Note (Signed)
Ongoing R ear -worsening  Plans to f/u with ENT Did have carotids checked

## 2018-08-15 NOTE — Patient Instructions (Addendum)
Go back to the ENT regarding your tinnitus   For weight loss  Try to get most of your carbohydrates from produce (with the exception of white potatoes)  Eat less bread/pasta/rice/snack foods/cereals/sweets and other items from the middle of the grocery store (processed carbs)   Flu shot today   If you are interested in the new shingles vaccine (Shingrix) - call your local pharmacy to check on coverage and availability  If affordable - get on waiting list at your pharmacy   Your vitamin D level is stable- keep taking your vitamin D   Follow up with ENT - touch base about the tinnitus   For rash /dermatitis on right foot- try over the counter cortisone cream 1-2 times daily

## 2018-08-15 NOTE — Assessment & Plan Note (Signed)
Gabapentin and prn tramadol are helpful

## 2018-08-15 NOTE — Assessment & Plan Note (Signed)
bp is stable today  No cp or palpitations or headaches or edema  No side effects to medicines  BP Readings from Last 3 Encounters:  08/15/18 126/72  04/08/18 108/74  03/16/18 118/80

## 2018-08-15 NOTE — Assessment & Plan Note (Signed)
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

## 2018-08-15 NOTE — Assessment & Plan Note (Signed)
Some improvement with tx of GERD  nexium bid

## 2018-09-14 ENCOUNTER — Other Ambulatory Visit: Payer: Self-pay | Admitting: *Deleted

## 2018-09-14 MED ORDER — TRAMADOL HCL 50 MG PO TABS: 50.0000 mg | ORAL_TABLET | Freq: Four times a day (QID) | ORAL | 0 refills | Status: DC | PRN

## 2018-09-14 NOTE — Telephone Encounter (Signed)
Name of Medication: Tramadol Name of Pharmacy: [facility] or Written Date and Quantity: 08/04/17 #30 tabs with 1 refill Last Office Visit and Type: CPE on 08/15/18 Next Office Visit and Type: none scheduled  Last Controlled Substance Agreement Date: 11/13/14 Last UDS:11/13/14

## 2018-09-15 ENCOUNTER — Telehealth: Payer: Self-pay | Admitting: Family Medicine

## 2018-09-15 NOTE — Telephone Encounter (Signed)
Spoke to pt who states she began taking nexium daily, and had not experienced these symptoms until doing so

## 2018-09-15 NOTE — Telephone Encounter (Signed)
Try holding it for a week to see if symptoms improve (then let me know) In it's place take otc pepcid twice daily   Keep me posted

## 2018-09-15 NOTE — Telephone Encounter (Signed)
Copied from [place] [id number]. Topic: Quick Communication - See Telephone Encounter >> Sep 15, 2018 12:10 PM Conception Chancy, NT wrote: CRM for notification. See Telephone encounter for: 09/15/18.   Patient is calling and states she believes one of her medications is causing her to be dizzy and nauseated. She would like to know if she needs to stop taking a medication. She states it has been going on for 2-3 days.

## 2018-09-15 NOTE — Telephone Encounter (Signed)
Pt notified of Dr. Marliss Coots instructions and verbalized understanding. Pt will give it a week and update [place]

## 2018-09-16 ENCOUNTER — Other Ambulatory Visit: Payer: Self-pay | Admitting: Otolaryngology

## 2018-09-16 DIAGNOSIS — H93A1 Pulsatile tinnitus, right ear: Secondary | ICD-10-CM

## 2018-09-28 ENCOUNTER — Ambulatory Visit
Admission: RE | Admit: 2018-09-28 | Discharge: 2018-09-28 | Disposition: A | Discharge status: Home / Self Care | Payer: 59 | Source: Ambulatory Visit | Attending: Otolaryngology | Admitting: Otolaryngology

## 2018-09-28 DIAGNOSIS — H93A1 Pulsatile tinnitus, right ear: Secondary | ICD-10-CM

## 2018-09-28 MED ORDER — GADOBENATE DIMEGLUMINE 529 MG/ML IV SOLN
15.0000 mL | Freq: Once | INTRAVENOUS | Status: AC | PRN
  Administered 2018-09-28: 15 mL via INTRAVENOUS

## 2018-10-25 ENCOUNTER — Other Ambulatory Visit: Payer: Self-pay | Admitting: Neurosurgery

## 2018-10-25 DIAGNOSIS — H93A1 Pulsatile tinnitus, right ear: Secondary | ICD-10-CM

## 2018-10-26 ENCOUNTER — Other Ambulatory Visit: Payer: Self-pay | Admitting: Neurosurgery

## 2018-11-14 ENCOUNTER — Other Ambulatory Visit: Payer: Self-pay | Admitting: Physician Assistant

## 2018-11-14 NOTE — H&P (Addendum)
Chief Complaint   tinnitus  HPI   HPI: Mindy Long is a 57 y.o. female with a primary complaint of several years of right-sided tinnitus.  She says initially many years ago it started as a sensation of hearing music in her right ear.  More recently over the last few months it has become pulsatile with her heartbeat.  She notes that the pulsatile tinnitus is present constantly, and is quite loud.  She says in fact sometimes wakes her up from sleep.  She notes that she is actually able to press on her neck just below her ear with some pressure and this completely stops the pulsations.  She does this when she goes to the bed in order to fall asleep.  She has not noted any symptoms in the left ear, any associated visual changes, blurry or double vision, numbness tingling or weakness of the upper or lower extremities.  She is walking normally without any imbalance or falls.  She does not report any history of significant head trauma.   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  She presents today for diagnostic cerebral angiogram.  She is without any concerns today.   Patient Active Problem List   Diagnosis Date Noted  . Abnormal serum ACE level 04/08/2018  . Itching 03/16/2018  . Chronic cough 12/14/2017  . Eye exam abnormal 08/04/2017  . Thoracic back pain 09/06/2015  . Tinnitus 11/13/2014  . History of shingles 04/27/2014  . Back pain 04/20/2014  . OSA (obstructive sleep apnea) 09/27/2012  . Snoring 06/21/2012  . Fatigue 06/20/2012  . Colon cancer screening 11/04/2011  . Routine general medical examination at a health care facility 10/22/2011  . Chronic foot pain 09/11/2011  . Vitamin D deficiency 07/02/2010  . DISTURBANCE OF SKIN SENSATION 04/07/2010  . PALPITATIONS 04/07/2010  . HYPOKALEMIA 07/10/2009  . PERIMENOPAUSAL STATUS 06/20/2009  . Allergic rhinitis 04/03/2008  . Essential hypertension 01/25/2008   . G E R D 07/05/2007  . H/O peptic ulcer 07/05/2007  . Headache, chronic daily 07/05/2007  . NEPHROLITHIASIS, HX OF 07/05/2007    PMH: Past Medical History:  Diagnosis Date  . Back pain   . GERD (gastroesophageal reflux disease)   . Headache(784.0)   . History of nephrolithiasis   . Hypertension   . Plantar fasciitis   . Sleep apnea   . Ulcer 1980's   peptic ulcer disease    PSH: Past Surgical History:  Procedure Laterality Date  . ABDOMINAL HYSTERECTOMY  2002  . CHOLECYSTECTOMY  1984  . COLONOSCOPY  12/18/2011   Normal  . OOPHORECTOMY       (Not in a hospital admission)  SH: Social History   Tobacco Use  . Smoking status: Never Smoker  . Smokeless tobacco: Never Used  Substance Use Topics  . Alcohol use: No    Alcohol/week: 0.0 standard drinks  . Drug use: No    MEDS: Prior to Admission medications   Medication Sig Start Date End Date Taking? Authorizing Provider  amLODipine (NORVASC) 5 MG tablet TAKE 1 TABLET (5 MG TOTAL) BY MOUTH DAILY. 08/15/18   Tower, Wynelle Fanny, MD  celecoxib (CELEBREX) 200 MG capsule TAKE 1 CAPSULE (200 MG TOTAL) BY MOUTH DAILY AS NEEDED. 01/11/18   Tower, Wynelle Fanny, MD  Cholecalciferol (VITAMIN D3) 2000 UNITS capsule Take 2,000 Units by mouth daily.      [provider]  gabapentin (NEURONTIN)  300 MG capsule Take 300 mg by mouth 3 (three) times daily.    [provider]  hydrOXYzine (ATARAX/VISTARIL) 10 MG tablet Take 1 tablet (10 mg total) by mouth 3 (three) times daily as needed. 03/16/18   Tower, Wynelle Fanny, MD  ondansetron (ZOFRAN) 4 MG tablet Take 1 tablet (4 mg total) by mouth every 8 (eight) hours as needed. 11/01/17   Jearld Fenton, NP  traMADol (ULTRAM) 50 MG tablet Take 1-2 tablets (50-100 mg total) by mouth every 6 (six) hours as needed. 09/14/18   Tower, Wynelle Fanny, MD    ALLERGY: Allergies  Allergen Reactions  . Atenolol Cough  . Hydrocodone Nausea Only    Social History   Tobacco Use  . Smoking status:  Never Smoker  . Smokeless tobacco: Never Used  Substance Use Topics  . Alcohol use: No    Alcohol/week: 0.0 standard drinks     Family History  Problem Relation Age of Onset  . Stroke Father   . Hypertension Father   . Diabetes Father   . Cancer Brother        stomach tumor     ROS   ROS  Exam   There were no vitals filed for this visit. General appearance: WDWN, NAD Eyes: No scleral injection Cardiovascular: Regular rate and rhythm without murmurs, rubs, gallops. No edema or variciosities. Distal pulses normal. Pulmonary: Effort normal, non-labored breathing Musculoskeletal:     Muscle tone upper extremities: Normal    Muscle tone lower extremities: Normal    Motor exam: Upper Extremities Deltoid Bicep Tricep Grip  Right 5/5 5/5 5/5 5/5  Left 5/5 5/5 5/5 5/5   Lower Extremity IP Quad PF DF EHL  Right 5/5 5/5 5/5 5/5 5/5  Left 5/5 5/5 5/5 5/5 5/5   Neurological Mental Status:    - Patient is awake, alert, oriented to person, place, month, year, and situation    - Patient is able to give a clear and coherent history.    - No signs of aphasia or neglect Cranial Nerves    - II: Visual Fields are full. PERRL    - III/IV/VI: EOMI without ptosis or diploplia.     - V: Facial sensation is grossly normal    - VII: Facial movement is symmetric.     - VIII: hearing is intact to voice    - X: Uvula elevates symmetrically    - XI: Shoulder shrug is symmetric.    - XII: tongue is midline without atrophy or fasciculations.  Sensory: Sensation grossly intact to LT Deep Tendon Reflexes    - 2+ and symmetric in the biceps and patellae.  Plantars   - Toes are downgoing bilaterally.  Cerebellar    - FNF and HKS are intact bilaterally   Results - Imaging/Labs   No results found for this or any previous visit (from the past 48 hour(s)).  No results found.  IMAGING: MRA of the neck dated 09/28/2018 was reviewed.  This does not demonstrate any significant stenosis or  aneurysm of the carotid or vertebral circulation.  Previous MRA of the head dated 11/26/2014 was also reviewed and does not demonstrate any significant abnormalities.  Impression/Plan   57 y.o. female with progression of right-sided pulsatile tinnitus.  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   We will proceed with diagnostic cerebral angiogram.    While in the office risks, benefits and alternatives to the  procedure were discussed.  Patient stated an own language understanding and wished to proceed.

## 2018-11-15 ENCOUNTER — Ambulatory Visit (HOSPITAL_COMMUNITY)
Admission: RE | Admit: 2018-11-15 | Discharge: 2018-11-15 | Disposition: A | Discharge status: Home / Self Care | Payer: 59 | Source: Ambulatory Visit | Attending: Neurosurgery | Admitting: Neurosurgery

## 2018-11-15 ENCOUNTER — Other Ambulatory Visit: Payer: Self-pay | Admitting: Neurosurgery

## 2018-11-15 ENCOUNTER — Encounter (HOSPITAL_COMMUNITY): Payer: Self-pay | Admitting: Neurosurgery

## 2018-11-15 ENCOUNTER — Other Ambulatory Visit: Payer: Self-pay

## 2018-11-15 DIAGNOSIS — H93A1 Pulsatile tinnitus, right ear: Secondary | ICD-10-CM | POA: Diagnosis present

## 2018-11-15 DIAGNOSIS — I10 Essential (primary) hypertension: Secondary | ICD-10-CM | POA: Insufficient documentation

## 2018-11-15 DIAGNOSIS — K219 Gastro-esophageal reflux disease without esophagitis: Secondary | ICD-10-CM | POA: Insufficient documentation

## 2018-11-15 DIAGNOSIS — M79673 Pain in unspecified foot: Secondary | ICD-10-CM | POA: Insufficient documentation

## 2018-11-15 DIAGNOSIS — Z8249 Family history of ischemic heart disease and other diseases of the circulatory system: Secondary | ICD-10-CM | POA: Insufficient documentation

## 2018-11-15 DIAGNOSIS — Z885 Allergy status to narcotic agent status: Secondary | ICD-10-CM | POA: Insufficient documentation

## 2018-11-15 DIAGNOSIS — G8929 Other chronic pain: Secondary | ICD-10-CM | POA: Insufficient documentation

## 2018-11-15 DIAGNOSIS — G4733 Obstructive sleep apnea (adult) (pediatric): Secondary | ICD-10-CM | POA: Diagnosis not present

## 2018-11-15 DIAGNOSIS — M546 Pain in thoracic spine: Secondary | ICD-10-CM | POA: Insufficient documentation

## 2018-11-15 HISTORY — PX: IR ANGIO EXTERNAL CAROTID SEL EXT CAROTID BILAT MOD SED: IMG5372

## 2018-11-15 HISTORY — PX: IR US GUIDE VASC ACCESS RIGHT: IMG2390

## 2018-11-15 HISTORY — PX: IR ANGIO VERTEBRAL SEL VERTEBRAL UNI R MOD SED: IMG5368

## 2018-11-15 HISTORY — PX: IR ANGIO INTRA EXTRACRAN SEL INTERNAL CAROTID BILAT MOD SED: IMG5363

## 2018-11-15 LAB — CBC WITH DIFFERENTIAL/PLATELET
ABS IMMATURE GRANULOCYTES: 0.01 10*3/uL (ref 0.00–0.07)
BASOS PCT: 1 %
Basophils Absolute: 0 10*3/uL (ref 0.0–0.1)
Eosinophils Absolute: 0.1 10*3/uL (ref 0.0–0.5)
Eosinophils Relative: 3 %
HCT: 44.7 % (ref 36.0–46.0)
Hemoglobin: 13.7 g/dL (ref 12.0–15.0)
Immature Granulocytes: 0 %
Lymphocytes Relative: 39 %
Lymphs Abs: 2 10*3/uL (ref 0.7–4.0)
MCH: 24.4 pg — AB (ref 26.0–34.0)
MCHC: 30.6 g/dL (ref 30.0–36.0)
MCV: 79.5 fL — AB (ref 80.0–100.0)
MONOS PCT: 9 %
Monocytes Absolute: 0.5 10*3/uL (ref 0.1–1.0)
Neutro Abs: 2.5 10*3/uL (ref 1.7–7.7)
Neutrophils Relative %: 48 %
Platelets: 225 10*3/uL (ref 150–400)
RBC: 5.62 MIL/uL — ABNORMAL HIGH (ref 3.87–5.11)
RDW: 13.8 % (ref 11.5–15.5)
WBC: 5.2 10*3/uL (ref 4.0–10.5)
nRBC: 0 % (ref 0.0–0.2)

## 2018-11-15 LAB — PROTIME-INR
INR: 0.94
Prothrombin Time: 12.5 seconds (ref 11.4–15.2)

## 2018-11-15 LAB — BASIC METABOLIC PANEL
Anion gap: 11 (ref 5–15)
BUN: 11 mg/dL (ref 6–20)
CO2: 24 mmol/L (ref 22–32)
Calcium: 9.3 mg/dL (ref 8.9–10.3)
Chloride: 105 mmol/L (ref 98–111)
Creatinine, Ser: 0.93 mg/dL (ref 0.44–1.00)
GFR calc Af Amer: >60 mL/min (ref >60)
GFR calc non Af Amer: >60 mL/min (ref >60)
Glucose, Bld: 98 mg/dL (ref 70–99)
Potassium: 3.6 mmol/L (ref 3.5–5.1)
Sodium: 140 mmol/L (ref 135–145)

## 2018-11-15 LAB — APTT: aPTT: 23 seconds — ABNORMAL LOW (ref 24–36)

## 2018-11-15 MED ORDER — HEPARIN SODIUM (PORCINE) 1000 UNIT/ML IJ SOLN
INTRAMUSCULAR | Status: AC | PRN
  Administered 2018-11-15: 3000 [IU] via INTRAVENOUS

## 2018-11-15 MED ORDER — LIDOCAINE HCL 1 % IJ SOLN
INTRAMUSCULAR | Status: AC
  Filled 2018-11-15: qty 20

## 2018-11-15 MED ORDER — VERAPAMIL HCL 2.5 MG/ML IV SOLN
INTRAVENOUS | Status: AC
  Administered 2018-11-15: 2.5 mg
  Filled 2018-11-15: qty 2

## 2018-11-15 MED ORDER — HEPARIN SODIUM (PORCINE) 1000 UNIT/ML IJ SOLN
INTRAMUSCULAR | Status: AC
  Filled 2018-11-15: qty 1

## 2018-11-15 MED ORDER — FENTANYL CITRATE (PF) 100 MCG/2ML IJ SOLN
INTRAMUSCULAR | Status: AC
  Filled 2018-11-15: qty 2

## 2018-11-15 MED ORDER — CEFAZOLIN SODIUM-DEXTROSE 2-4 GM/100ML-% IV SOLN: 2.0000 g | INTRAVENOUS | Status: DC

## 2018-11-15 MED ORDER — MIDAZOLAM HCL 2 MG/2ML IJ SOLN
INTRAMUSCULAR | Status: AC
  Filled 2018-11-15: qty 2

## 2018-11-15 MED ORDER — FENTANYL CITRATE (PF) 100 MCG/2ML IJ SOLN
INTRAMUSCULAR | Status: AC | PRN
  Administered 2018-11-15: 25 ug via INTRAVENOUS

## 2018-11-15 MED ORDER — IOPAMIDOL (ISOVUE-300) INJECTION 61%
INTRAVENOUS | Status: AC
  Administered 2018-11-15: 60 mL
  Filled 2018-11-15: qty 100

## 2018-11-15 MED ORDER — MIDAZOLAM HCL 2 MG/2ML IJ SOLN
INTRAMUSCULAR | Status: AC | PRN
  Administered 2018-11-15: 1 mg via INTRAVENOUS

## 2018-11-15 MED ORDER — LIDOCAINE-PRILOCAINE 2.5-2.5 % EX CREA
TOPICAL_CREAM | Freq: Once | CUTANEOUS | Status: DC
  Filled 2018-11-15: qty 5

## 2018-11-15 MED ORDER — NITROGLYCERIN 1 MG/10 ML FOR IR/CATH LAB
INTRA_ARTERIAL | Status: AC
  Administered 2018-11-15: 300 ug
  Filled 2018-11-15: qty 10

## 2018-11-15 MED ORDER — LIDOCAINE HCL (PF) 1 % IJ SOLN
INTRAMUSCULAR | Status: AC | PRN
  Administered 2018-11-15: 1 mL

## 2018-11-15 NOTE — Sedation Documentation (Signed)
Tiara band inflated. 15 cc of air.

## 2018-11-15 NOTE — Discharge Instructions (Addendum)
Radial Site Care Refer to this sheet in the next few weeks. These instructions provide you with information about caring for yourself after your procedure. Your health care provider may also give you more specific instructions. Your treatment has been planned according to current medical practices, but problems sometimes occur. Call your health care provider if you have any problems or questions after your procedure. What can I expect after the procedure? After your procedure, it is typical to have the following:  Bruising at the radial site that usually fades within 1-2 weeks.  Blood collecting in the tissue (hematoma) that may be painful to the touch. It should usually decrease in size and tenderness within 1-2 weeks.  Follow these instructions at home:  Take medicines only as directed by your health care provider.  You may shower 24-48 hours after the procedure or as directed by your health care provider. Remove the bandage (dressing) and gently wash the site with plain soap and water. Pat the area dry with a clean towel. Do not rub the site, because this may cause bleeding.  Do not take baths, swim, or use a hot tub until your health care provider approves.  Check your insertion site every day for redness, swelling, or drainage.  Do not apply powder or lotion to the site.  Do not flex or bend the affected arm for 24 hours or as directed by your health care provider.  Do not push or pull heavy objects with the affected arm for 24 hours or as directed by your health care provider.  Do not lift over 10 lb (4.5 kg) for 5 days after your procedure or as directed by your health care provider.  Ask your health care provider when it is okay to: ? Return to work or school. ? Resume usual physical activities or sports. ? Resume sexual activity.  Do not drive home if you are discharged the same day as the procedure. Have someone else drive you.  You may drive 24 hours after the procedure  unless otherwise instructed by your health care provider.  Do not operate machinery or power tools for 24 hours after the procedure.  If your procedure was done as an outpatient procedure, which means that you went home the same day as your procedure, a responsible adult should be with you for the first 24 hours after you arrive home.  Keep all follow-up visits as directed by your health care provider. This is important. Contact a health care provider if:  You have a fever.  You have chills.  You have increased bleeding from the radial site. Hold pressure on the site. CALL 911 Get help right away if:  You have unusual pain at the radial site.  You have redness, warmth, or swelling at the radial site.  You have drainage (other than a small amount of blood on the dressing) from the radial site.  The radial site is bleeding, and the bleeding does not stop after 30 minutes of holding steady pressure on the site.  Your arm or hand becomes pale, cool, tingly, or numb. This information is not intended to replace advice given to you by your health care provider. Make sure you discuss any questions you have with your health care provider. Document Released: 12/26/2010 Document Revised: 04/30/2016 Document Reviewed: 06/11/2014 Elsevier Interactive Patient Education  2018 [place]. Cerebral Angiogram Cerebral angiogram, also called cerebral angiography, is an imaging test. It uses X-ray or digital images and colored dye (iodine solution) that  is called contrast. An angiogram is done to look at blood vessels in the brain and neck. It helps to detect any abnormalities in the vessels that might affect blood flow. During the procedure, the physician will inject medicine into an area in your groin or arm to numb it. The physician will make a tiny incision in the groin or arm. A thin tube (catheter) will be slipped into an artery and moved to the brain or neck. The contrast will then be injected  into the catheter and images will be taken of the area. Tell a health care provider about:  Any allergies you have, particularly shellfish.  All medicines you are taking, including vitamins, herbs, eye drops, creams, and over-the-counter medicines.  Any problems you or family members have had with anesthetic medicines.  Any reactions you have had to contrast dye or iodine.  Any blood disorders you have.  Any surgeries you have had.  Medical conditions you have, including pregnancy or the possibility of pregnancy.  If you are currently breastfeeding. What are the risks? Generally, this is a safe procedure. However, problems can occur and include:  Allergic reaction to contrast material or anesthesia.  Damage to surrounding nerves, tissues, or structures.  Bleeding or bruising.  Blood clot.  Infection.  Inability to remember what happened (amnesia) (usually temporary).  Weakness, numbness, speech, or vision problems (usually temporary).  Stroke.  Kidney injury.  What happens before the procedure?  You may have: ? A physical exam. ? Blood and urine tests. ? Imaging tests, including X-rays or MRIs.  Ask your health care provider about: ? Changing or stopping your regular medicines. This is especially important if you are taking diabetes medicines or blood thinners. ? Taking medicines such as aspirin or ibuprofen. These medicines can thin your blood. Do not take these medicines before your procedure if your health care provider asks you not to.  Do not eat or drink anything after midnight on the night before the procedure or as directed by your health care provider. What happens during the procedure?  You will lie on your back on an imaging bed with a C-shaped machine around you.  Your head will be secured to the bed with a strap or device to help you keep still.  You will be given medicine to help you relax (sedative) through an IV in your arm.  Your heart rate  and other vital signals will be watched carefully.  Cleaning and numbing medicine (local anesthetic) will be applied to your skin where the insertion will take place. This is usually your groin, leg, or arm.  The radiologist will make a small incision.  The catheter will be inserted into an artery that leads to the head. You may feel slight pressure.  The catheter will be moved through the body all the way up to your neck and brain. Images will help your health care provider bring the catheter to the correct location.  The dye will be injected into the catheter and will travel to your head or neck area. You may feel a warming or burning sensation or notice a strange taste as the dye goes through your system.  Images will be taken of how the dye flows through the area. It is normal to hear beeping noises and machines during the procedure.  While the images are being taken, you may be given instructions on breathing, swallowing, moving, or talking.  When the images are finished, the catheter will be slowly removed.  Pressure will be applied to the skin to stop any bleeding. A tight bandage (dressing) or seal will be applied to the skin.  Your IV line will be removed. What happens after the procedure?  You will stay in a recovery area until the medicine has worn off. Your blood pressure and pulse will be observed.  You will be asked to lie flat and to keep your arm or leg straight in the recovery room.  The insertion site will be watched for bleeding and you will be checked often.  You may feel tired.  You may feel tenderness or notice bruising at the insertion site. This information is not intended to replace advice given to you by your health care provider. Make sure you discuss any questions you have with your health care provider. Document Released: 04/09/2014 Document Revised: 04/30/2016 Document Reviewed: 12/06/2013 Elsevier Interactive Patient Education  2017 [place]. Moderate Conscious Sedation, Adult, Care After These instructions provide you with information about caring for yourself after your procedure. Your health care provider may also give you more specific instructions. Your treatment has been planned according to current medical practices, but problems sometimes occur. Call your health care provider if you have any problems or questions after your procedure. What can I expect after the procedure? After your procedure, it is common:  To feel sleepy for several hours.  To feel clumsy and have poor balance for several hours.  To have poor judgment for several hours.  To vomit if you eat too soon.  Follow these instructions at home: For at least 24 hours after the procedure:   Do not: ? Participate in activities where you could fall or become injured. ? Drive. ? Use heavy machinery. ? Drink alcohol. ? Take sleeping pills or medicines that cause drowsiness. ? Make important decisions or sign legal documents. ? Take care of children on your own.  Rest. Eating and drinking  Follow the diet recommended by your health care provider.  If you vomit: ? Drink water, juice, or soup when you can drink without vomiting. ? Make sure you have little or no nausea before eating solid foods. General instructions  Have a responsible adult stay with you until you are awake and alert.  Take over-the-counter and prescription medicines only as told by your health care provider.  If you smoke, do not smoke without supervision.  Keep all follow-up visits as told by your health care provider. This is important. Contact a health care provider if:  You keep feeling nauseous or you keep vomiting.  You feel light-headed.  You develop a rash.  You have a fever. Get help right away if:  You have trouble breathing. This information is not intended to replace advice given to you by your health care provider. Make sure you discuss any questions  you have with your health care provider. Document Released: 09/13/2013 Document Revised: 04/27/2016 Document Reviewed: 03/14/2016 Elsevier Interactive Patient Education  [employer].

## 2019-01-18 DIAGNOSIS — Z6829 Body mass index (BMI) 29.0-29.9, adult: Secondary | ICD-10-CM | POA: Diagnosis not present

## 2019-01-18 DIAGNOSIS — H93A1 Pulsatile tinnitus, right ear: Secondary | ICD-10-CM | POA: Diagnosis not present

## 2019-02-08 ENCOUNTER — Other Ambulatory Visit: Payer: Self-pay | Admitting: Family Medicine

## 2019-02-09 NOTE — Telephone Encounter (Signed)
Last office visit 08/15/2018 for CPE.  Last refilled 09/14/2018 for #30 with no refills.  No future appointments.  Ok to refill?

## 2019-02-10 DIAGNOSIS — J343 Hypertrophy of nasal turbinates: Secondary | ICD-10-CM | POA: Diagnosis not present

## 2019-02-10 DIAGNOSIS — H93A1 Pulsatile tinnitus, right ear: Secondary | ICD-10-CM | POA: Diagnosis not present

## 2019-02-10 DIAGNOSIS — H93A9 Pulsatile tinnitus, unspecified ear: Secondary | ICD-10-CM | POA: Diagnosis not present

## 2019-02-10 DIAGNOSIS — J302 Other seasonal allergic rhinitis: Secondary | ICD-10-CM | POA: Diagnosis not present

## 2019-04-06 ENCOUNTER — Other Ambulatory Visit: Payer: Self-pay | Admitting: Family Medicine

## 2019-04-06 DIAGNOSIS — Z1231 Encounter for screening mammogram for malignant neoplasm of breast: Secondary | ICD-10-CM

## 2019-05-31 ENCOUNTER — Other Ambulatory Visit: Payer: Self-pay

## 2019-05-31 ENCOUNTER — Ambulatory Visit
Admission: RE | Admit: 2019-05-31 | Discharge: 2019-05-31 | Disposition: A | Discharge status: Home / Self Care | Payer: BC Managed Care – PPO | Source: Ambulatory Visit | Attending: Family Medicine | Admitting: Family Medicine

## 2019-05-31 DIAGNOSIS — Z1231 Encounter for screening mammogram for malignant neoplasm of breast: Secondary | ICD-10-CM | POA: Diagnosis not present

## 2019-07-03 ENCOUNTER — Other Ambulatory Visit: Payer: Self-pay | Admitting: Family Medicine

## 2019-07-04 NOTE — Telephone Encounter (Signed)
CPE scheduled for 08/22/19.  Last refilled 02/09/19 for #30 with no refills.

## 2019-08-16 ENCOUNTER — Telehealth: Payer: Self-pay | Admitting: Family Medicine

## 2019-08-16 DIAGNOSIS — E559 Vitamin D deficiency, unspecified: Secondary | ICD-10-CM

## 2019-08-16 DIAGNOSIS — Z0000 Encounter for general adult medical examination without abnormal findings: Secondary | ICD-10-CM

## 2019-08-16 DIAGNOSIS — I10 Essential (primary) hypertension: Secondary | ICD-10-CM

## 2019-08-16 NOTE — Telephone Encounter (Signed)
-----   Message from Ellamae Sia sent at 08/08/2019 11:15 AM EDT ----- Regarding: Lab orders for Thursday, 9.10.20 Patient is scheduled for CPX labs, please order future labs, Thanks , [name]

## 2019-08-17 ENCOUNTER — Other Ambulatory Visit: Payer: BC Managed Care – PPO

## 2019-08-18 ENCOUNTER — Other Ambulatory Visit (INDEPENDENT_AMBULATORY_CARE_PROVIDER_SITE_OTHER): Payer: BC Managed Care – PPO

## 2019-08-18 DIAGNOSIS — I10 Essential (primary) hypertension: Secondary | ICD-10-CM

## 2019-08-18 DIAGNOSIS — E559 Vitamin D deficiency, unspecified: Secondary | ICD-10-CM

## 2019-08-18 LAB — COMPREHENSIVE METABOLIC PANEL
ALT: 13 U/L (ref 0–35)
AST: 18 U/L (ref 0–37)
Albumin: 4 g/dL (ref 3.5–5.2)
Alkaline Phosphatase: 90 U/L (ref 39–117)
BUN: 14 mg/dL (ref 6–23)
CO2: 27 mEq/L (ref 19–32)
Calcium: 9.3 mg/dL (ref 8.4–10.5)
Chloride: 105 mEq/L (ref 96–112)
Creatinine, Ser: 0.81 mg/dL (ref 0.40–1.20)
GFR: 87.2 mL/min (ref >60.00)
Glucose, Bld: 82 mg/dL (ref 70–99)
Potassium: 3.8 mEq/L (ref 3.5–5.1)
Sodium: 140 mEq/L (ref 135–145)
Total Bilirubin: 0.4 mg/dL (ref 0.2–1.2)
Total Protein: 7.3 g/dL (ref 6.0–8.3)

## 2019-08-18 LAB — CBC WITH DIFFERENTIAL/PLATELET
Basophils Absolute: 0 10*3/uL (ref 0.0–0.1)
Basophils Relative: 0.7 % (ref 0.0–3.0)
Eosinophils Absolute: 0.1 10*3/uL (ref 0.0–0.7)
Eosinophils Relative: 2.1 % (ref 0.0–5.0)
HCT: 41.3 % (ref 36.0–46.0)
Hemoglobin: 13.2 g/dL (ref 12.0–15.0)
Lymphocytes Relative: 40.6 % (ref 12.0–46.0)
Lymphs Abs: 2.4 10*3/uL (ref 0.7–4.0)
MCHC: 32 g/dL (ref 30.0–36.0)
MCV: 77.3 fl — ABNORMAL LOW (ref 78.0–100.0)
Monocytes Absolute: 0.4 10*3/uL (ref 0.1–1.0)
Monocytes Relative: 7 % (ref 3.0–12.0)
Neutro Abs: 3 10*3/uL (ref 1.4–7.7)
Neutrophils Relative %: 49.6 % (ref 43.0–77.0)
Platelets: 255 10*3/uL (ref 150.0–400.0)
RBC: 5.35 Mil/uL — ABNORMAL HIGH (ref 3.87–5.11)
RDW: 14.6 % (ref 11.5–15.5)
WBC: 6 10*3/uL (ref 4.0–10.5)

## 2019-08-18 LAB — VITAMIN D 25 HYDROXY (VIT D DEFICIENCY, FRACTURES): VITD: 33.61 ng/mL (ref 30.00–100.00)

## 2019-08-18 LAB — TSH: TSH: 1.29 u[IU]/mL (ref 0.35–4.50)

## 2019-08-18 LAB — LIPID PANEL
Cholesterol: 214 mg/dL — ABNORMAL HIGH (ref 0–200)
HDL: 90.4 mg/dL (ref >39.00)
LDL Cholesterol: 103 mg/dL — ABNORMAL HIGH (ref 0–99)
NonHDL: 123.57
Total CHOL/HDL Ratio: 2
Triglycerides: 101 mg/dL (ref 0.0–149.0)
VLDL: 20.2 mg/dL (ref 0.0–40.0)

## 2019-08-21 ENCOUNTER — Other Ambulatory Visit: Payer: Self-pay | Admitting: *Deleted

## 2019-08-21 MED ORDER — AMLODIPINE BESYLATE 5 MG PO TABS: ORAL_TABLET | ORAL | 1 refills | Status: DC

## 2019-08-22 ENCOUNTER — Encounter: Payer: Self-pay | Admitting: Family Medicine

## 2019-08-22 ENCOUNTER — Ambulatory Visit (INDEPENDENT_AMBULATORY_CARE_PROVIDER_SITE_OTHER): Payer: BC Managed Care – PPO | Admitting: Family Medicine

## 2019-08-22 ENCOUNTER — Other Ambulatory Visit: Payer: Self-pay

## 2019-08-22 VITALS — BP 118/72 | HR 101 | Temp 98.0°F | Ht 61.75 in | Wt 169.2 lb

## 2019-08-22 DIAGNOSIS — M5440 Lumbago with sciatica, unspecified side: Secondary | ICD-10-CM | POA: Diagnosis not present

## 2019-08-22 DIAGNOSIS — Z0000 Encounter for general adult medical examination without abnormal findings: Secondary | ICD-10-CM | POA: Diagnosis not present

## 2019-08-22 DIAGNOSIS — I10 Essential (primary) hypertension: Secondary | ICD-10-CM

## 2019-08-22 DIAGNOSIS — E559 Vitamin D deficiency, unspecified: Secondary | ICD-10-CM | POA: Diagnosis not present

## 2019-08-22 DIAGNOSIS — M79673 Pain in unspecified foot: Secondary | ICD-10-CM

## 2019-08-22 DIAGNOSIS — Z23 Encounter for immunization: Secondary | ICD-10-CM | POA: Diagnosis not present

## 2019-08-22 DIAGNOSIS — G8929 Other chronic pain: Secondary | ICD-10-CM

## 2019-08-22 NOTE — Assessment & Plan Note (Signed)
bp in fair control at this time  BP Readings from Last 1 Encounters:  08/22/19 118/72   No changes needed Most recent labs reviewed  Disc lifstyle change with low sodium diet and exercise

## 2019-08-22 NOTE — Assessment & Plan Note (Signed)
Stable at 33.6- low normal Enc to continue vit D daily  Vitamin D level is therapeutic with current supplementation Disc importance of this to bone and overall health

## 2019-08-22 NOTE — Assessment & Plan Note (Signed)
Reviewed health habits including diet and exercise and skin cancer prevention Reviewed appropriate screening tests for age  Also reviewed health mt list, fam hx and immunization status , as well as social and family history   See HPI Labs reviewed  Excellent cholesterol  Tdap and flu vaccines today

## 2019-08-22 NOTE — Progress Notes (Signed)
Subjective:    Patient ID: Mindy Long, female    DOB: May 28, 1961, 58 y.o.   MRN: [id number]  HPI Here for health maintenance exam and to review chronic medical problems    Her back is bothering her more and more  Sharp pain when she twists or turns  Taking more tramadol The more she walks/exercises the worse it gets  Across lower back   She still has tinnitus  It is worse Vascular studies are normal   She has a knot in neck   Still has acid reflux symptoms on and off  Watches her diet   Weight  Wt Readings from Last 3 Encounters:  11/15/18 165 lb (74.8 kg)  08/15/18 171 lb (77.6 kg)  04/08/18 175 lb (79.4 kg)  31.20 kg/m  Loosing weight  Trying to watch diet  Can't exercise much due to back      Pap 6/09 normal Total hyst   Td 7/10 -will get Tdap vaccine  Is around babies   Flu vaccine -will get today   Mammogram 6/20 Self breast exam-no lumps   Colonoscopy 1/13   Hypertension  bp is stable today  No cp or palpitations or headaches or edema  No side effects to medicines  BP Readings from Last 3 Encounters:  11/15/18 109/74  08/15/18 126/72  04/08/18 108/74       Lab Results  Component Value Date   CREATININE 0.81 08/18/2019   BUN 14 08/18/2019   NA 140 08/18/2019   K 3.8 08/18/2019   CL 105 08/18/2019   CO2 27 08/18/2019   Lab Results  Component Value Date   ALT 13 08/18/2019   AST 18 08/18/2019   ALKPHOS 90 08/18/2019   BILITOT 0.4 08/18/2019    Lab Results  Component Value Date   WBC 6.0 08/18/2019   HGB 13.2 08/18/2019   HCT 41.3 08/18/2019   MCV 77.3 (L) 08/18/2019   PLT 255.0 08/18/2019   Lab Results  Component Value Date   TSH 1.29 08/18/2019    H/o vit D def  Level 33.6 Takes vitamin D regularly   Cholesterol Lab Results  Component Value Date   CHOL 214 (H) 08/18/2019   CHOL 181 08/11/2018   CHOL 194 07/27/2017   Lab Results  Component Value Date   HDL 90.40 08/18/2019   HDL 75.30 08/11/2018   HDL 84.30  07/27/2017   Lab Results  Component Value Date   LDLCALC 103 (H) 08/18/2019   LDLCALC 96 08/11/2018   LDLCALC 97 07/27/2017   Lab Results  Component Value Date   TRIG 101.0 08/18/2019   TRIG 49.0 08/11/2018   TRIG 63.0 07/27/2017   Lab Results  Component Value Date   CHOLHDL 2 08/18/2019   CHOLHDL 2 08/11/2018   CHOLHDL 2 07/27/2017   Lab Results  Component Value Date   LDLDIRECT 126.7 10/23/2011   LDLDIRECT 132.9 06/20/2009   LDLDIRECT 109.4 01/25/2008   very good profile   Glucose 82   Patient Active Problem List   Diagnosis Date Noted  . Abnormal serum ACE level 04/08/2018  . Itching 03/16/2018  . Chronic cough 12/14/2017  . Eye exam abnormal 08/04/2017  . Thoracic back pain 09/06/2015  . Tinnitus 11/13/2014  . History of shingles 04/27/2014  . Low back pain 04/20/2014  . OSA (obstructive sleep apnea) 09/27/2012  . Snoring 06/21/2012  . Fatigue 06/20/2012  . Colon cancer screening 11/04/2011  . Routine general medical examination at a health care  facility 10/22/2011  . Chronic foot pain 09/11/2011  . Vitamin D deficiency 07/02/2010  . DISTURBANCE OF SKIN SENSATION 04/07/2010  . PALPITATIONS 04/07/2010  . HYPOKALEMIA 07/10/2009  . PERIMENOPAUSAL STATUS 06/20/2009  . Allergic rhinitis 04/03/2008  . Essential hypertension 01/25/2008  . G E R D 07/05/2007  . H/O peptic ulcer 07/05/2007  . Headache, chronic daily 07/05/2007  . NEPHROLITHIASIS, HX OF 07/05/2007   Past Medical History:  Diagnosis Date  . Back pain   . GERD (gastroesophageal reflux disease)   . Headache(784.0)   . History of nephrolithiasis   . Hypertension   . Plantar fasciitis   . Sleep apnea   . Ulcer 1980's   peptic ulcer disease   Past Surgical History:  Procedure Laterality Date  . ABDOMINAL HYSTERECTOMY  2002  . CHOLECYSTECTOMY  1984  . COLONOSCOPY  12/18/2011   Normal  . IR ANGIO EXTERNAL CAROTID SEL EXT CAROTID BILAT MOD SED  11/15/2018  . IR ANGIO INTRA EXTRACRAN SEL  INTERNAL CAROTID BILAT MOD SED  11/15/2018  . IR ANGIO VERTEBRAL SEL VERTEBRAL UNI R MOD SED  11/15/2018  . IR US GUIDE VASC ACCESS RIGHT  11/15/2018  . OOPHORECTOMY     Social History   Tobacco Use  . Smoking status: Never Smoker  . Smokeless tobacco: Never Used  Substance Use Topics  . Alcohol use: No    Alcohol/week: 0.0 standard drinks  . Drug use: No   Family History  Problem Relation Age of Onset  . Stroke Father   . Hypertension Father   . Diabetes Father   . Cancer Brother        stomach tumor   Allergies  Allergen Reactions  . Atenolol Cough  . Hydrocodone Nausea Only   Current Outpatient Medications on File Prior to Visit  Medication Sig Dispense Refill  . amLODipine (NORVASC) 5 MG tablet TAKE 1 TABLET (5 MG TOTAL) BY MOUTH DAILY. 90 tablet 1  . celecoxib (CELEBREX) 200 MG capsule TAKE 1 CAPSULE (200 MG TOTAL) BY MOUTH DAILY AS NEEDED. 90 capsule 0  . Cholecalciferol (VITAMIN D3) 2000 UNITS capsule Take 2,000 Units by mouth daily.      Marland Kitchen gabapentin (NEURONTIN) 300 MG capsule Take 300 mg by mouth 3 (three) times daily.    . hydrOXYzine (ATARAX/VISTARIL) 10 MG tablet Take 1 tablet (10 mg total) by mouth 3 (three) times daily as needed. 30 tablet 0  . ondansetron (ZOFRAN) 4 MG tablet Take 1 tablet (4 mg total) by mouth every 8 (eight) hours as needed. 20 tablet 0  . traMADol (ULTRAM) 50 MG tablet TAKE 1-2 TABLETS (50-100 MG TOTAL) BY MOUTH EVERY 6 (SIX) HOURS AS NEEDED. 30 tablet 0   No current facility-administered medications on file prior to visit.      Review of Systems  Constitutional: Negative for activity change, appetite change, fatigue, fever and unexpected weight change.  HENT: Negative for congestion, ear pain, rhinorrhea, sinus pressure and sore throat.   Eyes: Negative for pain, redness and visual disturbance.  Respiratory: Negative for cough, shortness of breath and wheezing.   Cardiovascular: Negative for chest pain and palpitations.   Gastrointestinal: Negative for abdominal pain, blood in stool, constipation and diarrhea.  Endocrine: Negative for polydipsia and polyuria.  Genitourinary: Negative for dysuria, frequency and urgency.  Musculoskeletal: Positive for back pain. Negative for arthralgias and myalgias.  Skin: Negative for pallor and rash.  Allergic/Immunologic: Negative for environmental allergies.  Neurological: Negative for dizziness, syncope and headaches.  Hematological: Negative for adenopathy. Does not bruise/bleed easily.  Psychiatric/Behavioral: Negative for decreased concentration and dysphoric mood. The patient is not nervous/anxious.       Objective:   Physical Exam Constitutional:      General: She is not in acute distress.    Appearance: Normal appearance. She is well-developed. She is obese. She is not ill-appearing or diaphoretic.  HENT:     Head: Normocephalic and atraumatic.     Right Ear: Tympanic membrane, ear canal and external ear normal.     Left Ear: Tympanic membrane, ear canal and external ear normal.     Nose: Nose normal. No congestion.     Mouth/Throat:     Mouth: Mucous membranes are moist.     Pharynx: Oropharynx is clear. No posterior oropharyngeal erythema.  Eyes:     General: No scleral icterus.    Extraocular Movements: Extraocular movements intact.     Conjunctiva/sclera: Conjunctivae normal.     Pupils: Pupils are equal, round, and reactive to light.  Neck:     Musculoskeletal: Normal range of motion and neck supple. No neck rigidity or muscular tenderness.     Thyroid: No thyromegaly.     Vascular: No carotid bruit or JVD.  Cardiovascular:     Rate and Rhythm: Normal rate and regular rhythm.     Pulses: Normal pulses.     Heart sounds: Normal heart sounds. No gallop.   Pulmonary:     Effort: Pulmonary effort is normal. No respiratory distress.     Breath sounds: Normal breath sounds. No wheezing.     Comments: Good air exch Chest:     Chest wall: No  tenderness.  Abdominal:     General: Bowel sounds are normal. There is no distension or abdominal bruit.     Palpations: Abdomen is soft. There is no mass.     Tenderness: There is no abdominal tenderness.     Hernia: No hernia is present.  Genitourinary:    Comments: Breast exam: No mass, nodules, thickening, tenderness, bulging, retraction, inflamation, nipple discharge or skin changes noted.  No axillary or clavicular LA.     Musculoskeletal: Normal range of motion.        General: No tenderness.     Right lower leg: No edema.     Left lower leg: No edema.     Comments: Limited rom of LS  Lymphadenopathy:     Cervical: No cervical adenopathy.  Skin:    General: Skin is warm and dry.     Coloration: Skin is not pale.     Findings: No erythema or rash.     Comments: Scattered skin tags Some hypo pigmented areas on legs - resembles vitiligo   Neurological:     Mental Status: She is alert. Mental status is at baseline.     Cranial Nerves: No cranial nerve deficit.     Motor: No abnormal muscle tone.     Coordination: Coordination normal.     Gait: Gait normal.     Deep Tendon Reflexes: Reflexes are normal and symmetric.  Psychiatric:        Mood and Affect: Mood normal.        Cognition and Memory: Cognition and memory normal.           Assessment & Plan:   Problem List Items Addressed This Visit      Cardiovascular and Mediastinum   Essential hypertension    bp in fair control at this time  BP Readings from Last 1 Encounters:  08/22/19 118/72   No changes needed Most recent labs reviewed  Disc lifstyle change with low sodium diet and exercise          Other   Vitamin D deficiency    Stable at 33.6- low normal Enc to continue vit D daily  Vitamin D level is therapeutic with current supplementation Disc importance of this to bone and overall health       Chronic foot pain    Has h/o plantar fasciitis  Supportive shoes help (crocks actually)  Gabapentin        Routine general medical examination at a health care facility - Primary    Reviewed health habits including diet and exercise and skin cancer prevention Reviewed appropriate screening tests for age  Also reviewed health mt list, fam hx and immunization status , as well as social and family history   See HPI Labs reviewed  Excellent cholesterol  Tdap and flu vaccines today      Relevant Orders   Flu Vaccine QUAD 6+ mos PF IM (Fluarix Quad PF) (Completed)   Tdap vaccine greater than or equal to 7yo IM (Completed)   Low back pain    Ongoing and pt is using tramadol Severe at times No neuro symptoms  Will ref to orthopedics      Relevant Orders   Ambulatory referral to Orthopedic Surgery    Other Visit Diagnoses    Need for Tdap vaccination       Relevant Orders   Tdap vaccine greater than or equal to 7yo IM (Completed)   Need for influenza vaccination       Relevant Orders   Flu Vaccine QUAD 6+ mos PF IM (Fluarix Quad PF) (Completed)

## 2019-08-22 NOTE — Assessment & Plan Note (Signed)
Has h/o plantar fasciitis  Supportive shoes help (crocks actually)  Gabapentin

## 2019-08-22 NOTE — Assessment & Plan Note (Signed)
Ongoing and pt is using tramadol Severe at times No neuro symptoms  Will ref to orthopedics

## 2019-08-22 NOTE — Patient Instructions (Addendum)
The office will call you regarding orthopedic appointment for low back pain   Flu shot and tetanus shot today   Labs look good   Take tramadol as needed for back pain  Stretch/ use heat on your back

## 2019-09-05 DIAGNOSIS — M4696 Unspecified inflammatory spondylopathy, lumbar region: Secondary | ICD-10-CM | POA: Diagnosis not present

## 2019-09-05 DIAGNOSIS — M545 Low back pain: Secondary | ICD-10-CM | POA: Diagnosis not present

## 2019-09-25 ENCOUNTER — Ambulatory Visit (INDEPENDENT_AMBULATORY_CARE_PROVIDER_SITE_OTHER)
Admission: RE | Admit: 2019-09-25 | Discharge: 2019-09-25 | Disposition: A | Discharge status: Home / Self Care | Payer: BC Managed Care – PPO | Source: Ambulatory Visit | Attending: Family Medicine | Admitting: Family Medicine

## 2019-09-25 ENCOUNTER — Ambulatory Visit (INDEPENDENT_AMBULATORY_CARE_PROVIDER_SITE_OTHER): Payer: BC Managed Care – PPO | Admitting: Family Medicine

## 2019-09-25 ENCOUNTER — Encounter: Payer: Self-pay | Admitting: Family Medicine

## 2019-09-25 ENCOUNTER — Other Ambulatory Visit: Payer: Self-pay

## 2019-09-25 VITALS — BP 122/70 | HR 95 | Temp 98.0°F | Ht 61.75 in | Wt 171.4 lb

## 2019-09-25 DIAGNOSIS — N6089 Other benign mammary dysplasias of unspecified breast: Secondary | ICD-10-CM | POA: Insufficient documentation

## 2019-09-25 DIAGNOSIS — M79671 Pain in right foot: Secondary | ICD-10-CM

## 2019-09-25 DIAGNOSIS — S99921A Unspecified injury of right foot, initial encounter: Secondary | ICD-10-CM | POA: Diagnosis not present

## 2019-09-25 DIAGNOSIS — N6082 Other benign mammary dysplasias of left breast: Secondary | ICD-10-CM | POA: Diagnosis not present

## 2019-09-25 MED ORDER — OMEPRAZOLE 20 MG PO CPDR: 20.0000 mg | DELAYED_RELEASE_CAPSULE | Freq: Every day | ORAL | 3 refills | Status: DC | PRN

## 2019-09-25 NOTE — Patient Instructions (Signed)
Keep area under breast clean and dry  Use warm compress if bothersome If red/swollen (could be infected) let us know Make sure clothing is dry and minimizes friction   Xray of foot today  Scab looks fine -keep it clean  Want to rule out a fracture   We will call you when result returns   I sent in your omeprazole

## 2019-09-25 NOTE — Assessment & Plan Note (Signed)
Small seb cyst under R breast- may be recurrent  No redness/tenderness/fluctuance or drainage  Several comedones also noted  Disc poss of early hydradenitis Disc hygiene and prev of infection

## 2019-09-25 NOTE — Progress Notes (Signed)
Subjective:    Patient ID: Mindy Long, female    DOB: June 13, 1961, 58 y.o.   MRN: [id number]  HPI Here for right foot pain and knot under her chest  Wt Readings from Last 3 Encounters:  09/25/19 171 lb 6 oz (77.7 kg)  08/22/19 169 lb 3 oz (76.7 kg)  11/15/18 165 lb (74.8 kg)   31.60 kg/m   Hit her foot with a chair about 2 weeks ago (chair fell on her foot when grand child knocked it down)  It bled initially  That stopped  Hurts to flex toes  Still very sore/ sore to the touch   Standing make it worse  Also plantar flexing foot   She cannot wear a shoe that is tight on the top   Injury is on the top of her foot-healing well   No n/t or weakness   Tdap vaccine utd/recent   She gets little knots/boils -on breast or bottom  Has been happening for 2 years  One under her breast right now   She has used hot compress  One on bottom occ opens and drains occasionally    Patient Active Problem List   Diagnosis Date Noted  . Right foot pain 09/25/2019  . Cyst of skin of breast 09/25/2019  . Abnormal serum ACE level 04/08/2018  . Itching 03/16/2018  . Chronic cough 12/14/2017  . Eye exam abnormal 08/04/2017  . Thoracic back pain 09/06/2015  . Tinnitus 11/13/2014  . History of shingles 04/27/2014  . Low back pain 04/20/2014  . OSA (obstructive sleep apnea) 09/27/2012  . Snoring 06/21/2012  . Fatigue 06/20/2012  . Colon cancer screening 11/04/2011  . Routine general medical examination at a health care facility 10/22/2011  . Chronic foot pain 09/11/2011  . Vitamin D deficiency 07/02/2010  . DISTURBANCE OF SKIN SENSATION 04/07/2010  . PALPITATIONS 04/07/2010  . HYPOKALEMIA 07/10/2009  . PERIMENOPAUSAL STATUS 06/20/2009  . Allergic rhinitis 04/03/2008  . Essential hypertension 01/25/2008  . G E R D 07/05/2007  . H/O peptic ulcer 07/05/2007  . Headache, chronic daily 07/05/2007  . NEPHROLITHIASIS, HX OF 07/05/2007   Past Medical History:  Diagnosis Date  .  Back pain   . GERD (gastroesophageal reflux disease)   . Headache(784.0)   . History of nephrolithiasis   . Hypertension   . Plantar fasciitis   . Sleep apnea   . Ulcer 1980's   peptic ulcer disease   Past Surgical History:  Procedure Laterality Date  . ABDOMINAL HYSTERECTOMY  2002  . CHOLECYSTECTOMY  1984  . COLONOSCOPY  12/18/2011   Normal  . IR ANGIO EXTERNAL CAROTID SEL EXT CAROTID BILAT MOD SED  11/15/2018  . IR ANGIO INTRA EXTRACRAN SEL INTERNAL CAROTID BILAT MOD SED  11/15/2018  . IR ANGIO VERTEBRAL SEL VERTEBRAL UNI R MOD SED  11/15/2018  . IR US GUIDE VASC ACCESS RIGHT  11/15/2018  . OOPHORECTOMY     Social History   Tobacco Use  . Smoking status: Never Smoker  . Smokeless tobacco: Never Used  Substance Use Topics  . Alcohol use: No    Alcohol/week: 0.0 standard drinks  . Drug use: No   Family History  Problem Relation Age of Onset  . Stroke Father   . Hypertension Father   . Diabetes Father   . Cancer Brother        stomach tumor   Allergies  Allergen Reactions  . Atenolol Cough  . Hydrocodone Nausea Only   Current  Outpatient Medications on File Prior to Visit  Medication Sig Dispense Refill  . amLODipine (NORVASC) 5 MG tablet TAKE 1 TABLET (5 MG TOTAL) BY MOUTH DAILY. 90 tablet 1  . celecoxib (CELEBREX) 200 MG capsule TAKE 1 CAPSULE (200 MG TOTAL) BY MOUTH DAILY AS NEEDED. 90 capsule 0  . Cholecalciferol (VITAMIN D3) 2000 UNITS capsule Take 2,000 Units by mouth daily.      Marland Kitchen gabapentin (NEURONTIN) 300 MG capsule Take 300 mg by mouth 3 (three) times daily.    . hydrOXYzine (ATARAX/VISTARIL) 10 MG tablet Take 1 tablet (10 mg total) by mouth 3 (three) times daily as needed. 30 tablet 0  . ondansetron (ZOFRAN) 4 MG tablet Take 1 tablet (4 mg total) by mouth every 8 (eight) hours as needed. 20 tablet 0  . traMADol (ULTRAM) 50 MG tablet TAKE 1-2 TABLETS (50-100 MG TOTAL) BY MOUTH EVERY 6 (SIX) HOURS AS NEEDED. 30 tablet 0   No current facility-administered  medications on file prior to visit.     Review of Systems  Constitutional: Negative for activity change, appetite change, fatigue, fever and unexpected weight change.  HENT: Negative for congestion, ear pain, rhinorrhea, sinus pressure and sore throat.   Eyes: Negative for pain, redness and visual disturbance.  Respiratory: Negative for cough, shortness of breath and wheezing.   Cardiovascular: Negative for chest pain and palpitations.  Gastrointestinal: Negative for abdominal pain, blood in stool, constipation and diarrhea.  Endocrine: Negative for polydipsia and polyuria.  Genitourinary: Negative for dysuria, frequency and urgency.  Musculoskeletal: Negative for arthralgias, back pain and myalgias.       Right foot pain  Skin: Negative for pallor and rash.       Healing scab on foot   occ cysts under breasts   Allergic/Immunologic: Negative for environmental allergies.  Neurological: Negative for dizziness, syncope and headaches.  Hematological: Negative for adenopathy. Does not bruise/bleed easily.  Psychiatric/Behavioral: Negative for decreased concentration and dysphoric mood. The patient is not nervous/anxious.        Objective:   Physical Exam Constitutional:      General: She is not in acute distress.    Appearance: Normal appearance. She is obese. She is not ill-appearing or diaphoretic.  HENT:     Head: Atraumatic.     Mouth/Throat:     Mouth: Mucous membranes are moist.  Eyes:     General: No scleral icterus.    Extraocular Movements: Extraocular movements intact.     Conjunctiva/sclera: Conjunctivae normal.     Pupils: Pupils are equal, round, and reactive to light.  Neck:     Musculoskeletal: Normal range of motion and neck supple.  Cardiovascular:     Rate and Rhythm: Regular rhythm. Tachycardia present.     Pulses: Normal pulses.     Heart sounds: Normal heart sounds.  Pulmonary:     Effort: Pulmonary effort is normal. No respiratory distress.     Breath  sounds: Normal breath sounds. No wheezing or rales.  Musculoskeletal:        General: Swelling and tenderness present. No deformity.     Right foot: Normal range of motion and normal capillary refill. Tenderness, bony tenderness and swelling present. No crepitus or deformity.     Comments: Healing abrasion on dorsal mid R foot  Some mild soft tissue swelling surrounding this  Mildly tender over dorsum of foot- 3,4th metatarsals  No ecchymosis Nl rom ankle /foot  More pain with plantar flexion of foot and toes Bears weight  normally  Lymphadenopathy:     Cervical: No cervical adenopathy.  Skin:    General: Skin is warm and dry.     Findings: No erythema.     Comments: Healing scab on dorsal R foot- no redness or drainage  Small seb cyst under L breast-no redness/non tender/not fluctuant  Several comedones noted on trunk  Neurological:     Mental Status: She is alert.     Sensory: No sensory deficit.     Motor: No weakness.     Coordination: Coordination normal.  Psychiatric:        Mood and Affect: Mood normal.           Assessment & Plan:   Problem List Items Addressed This Visit      Musculoskeletal and Integument   Cyst of skin of breast    Small seb cyst under R breast- may be recurrent  No redness/tenderness/fluctuance or drainage  Several comedones also noted  Disc poss of early hydradenitis Disc hygiene and prev of infection         Other   Right foot pain - Primary    After injury  Abrasion is healing/no signs of infection  Recommend elevation/ice and supportive shoes Xray today to r/o fracture       Relevant Orders   DG Foot Complete Right (Completed)

## 2019-09-25 NOTE — Assessment & Plan Note (Signed)
After injury  Abrasion is healing/no signs of infection  Recommend elevation/ice and supportive shoes Xray today to r/o fracture

## 2019-09-28 ENCOUNTER — Telehealth: Payer: Self-pay | Admitting: Family Medicine

## 2019-09-28 NOTE — Telephone Encounter (Signed)
Pt called back she stated she called pharmacy insurance paid.  Disregard message

## 2019-09-28 NOTE — Telephone Encounter (Signed)
Pt called stating dr tower prescribed her  Omeprazole and pharmacy stated insurance will not pay for quantity. 90 supply  She stated [facility] over telling yo what they need  [facility] [place]

## 2019-12-13 ENCOUNTER — Other Ambulatory Visit: Payer: Self-pay | Admitting: Family Medicine

## 2019-12-13 NOTE — Telephone Encounter (Signed)
Last filled on 01/11/2018 #90 with 0 refill. LOV for foot pain on 09/25/2019

## 2019-12-25 ENCOUNTER — Encounter: Payer: Self-pay | Admitting: Family Medicine

## 2019-12-25 ENCOUNTER — Other Ambulatory Visit: Payer: Self-pay

## 2019-12-25 ENCOUNTER — Ambulatory Visit (INDEPENDENT_AMBULATORY_CARE_PROVIDER_SITE_OTHER): Payer: BC Managed Care – PPO | Admitting: Family Medicine

## 2019-12-25 VITALS — BP 122/78 | HR 99 | Temp 97.2°F | Ht 61.75 in | Wt 172.6 lb

## 2019-12-25 DIAGNOSIS — G8929 Other chronic pain: Secondary | ICD-10-CM

## 2019-12-25 DIAGNOSIS — L02214 Cutaneous abscess of groin: Secondary | ICD-10-CM | POA: Insufficient documentation

## 2019-12-25 DIAGNOSIS — M5440 Lumbago with sciatica, unspecified side: Secondary | ICD-10-CM | POA: Diagnosis not present

## 2019-12-25 MED ORDER — CEPHALEXIN 500 MG PO CAPS: 500.0000 mg | ORAL_CAPSULE | Freq: Three times a day (TID) | ORAL | 0 refills | Status: DC

## 2019-12-25 NOTE — Assessment & Plan Note (Signed)
In area between labia minora and thigh- more painful a day ago until it ruptured/drained  Not much drainage on exam today  tx with keflex tid for 7d and report progress  Enc to keep clean with soap and water  Continue warm compresses  Update if not starting to improve in a week or if worsening

## 2019-12-25 NOTE — Assessment & Plan Note (Signed)
Uses tramadol prn  Seeing spine specialist and considering epidural injections Tramadol refilled with warning of sedation/habit potential

## 2019-12-25 NOTE — Patient Instructions (Signed)
Keep the abscess as clean as you can with soap and water  Continue the warm compresses  Antibiotic ointment is ok to use  Take the keflex as directed (antibiotic) -if any problems let me know   If this worsens  - bigger/more pain or redness -call us right away   It may continue to drain more / you can use some gauze for that

## 2019-12-25 NOTE — Progress Notes (Signed)
Subjective:    Patient ID: Mindy Long, female    DOB: 05-Feb-1961, 59 y.o.   MRN: [id number]  This visit occurred during the SARS-CoV-2 public health emergency.  Safety protocols were in place, including screening questions prior to the visit, additional usage of staff PPE, and extensive cleaning of exam room while observing appropriate contact time as indicated for disinfecting solutions.    HPI Pt presents with a ? Boil in L groin area    Wt Readings from Last 3 Encounters:  12/25/19 172 lb 9 oz (78.3 kg)  09/25/19 171 lb 6 oz (77.7 kg)  08/22/19 169 lb 3 oz (76.7 kg)   31.82 kg/m   She tends to get this recurrently every 3-4 mo  Started Friday -used a hot compress Got larger  Then burst last night  Still quite painful (almost could not walk)   She gets these in axillae/buttocks   She takes occ tramadol for back pain   Planning injection tx for her back  Needs refill   Patient Active Problem List   Diagnosis Date Noted  . Abscess of left groin 12/25/2019  . Right foot pain 09/25/2019  . Cyst of skin of breast 09/25/2019  . Abnormal serum ACE level 04/08/2018  . Itching 03/16/2018  . Chronic cough 12/14/2017  . Eye exam abnormal 08/04/2017  . Thoracic back pain 09/06/2015  . Tinnitus 11/13/2014  . History of shingles 04/27/2014  . Low back pain 04/20/2014  . OSA (obstructive sleep apnea) 09/27/2012  . Snoring 06/21/2012  . Fatigue 06/20/2012  . Colon cancer screening 11/04/2011  . Routine general medical examination at a health care facility 10/22/2011  . Chronic foot pain 09/11/2011  . Vitamin D deficiency 07/02/2010  . DISTURBANCE OF SKIN SENSATION 04/07/2010  . PALPITATIONS 04/07/2010  . HYPOKALEMIA 07/10/2009  . PERIMENOPAUSAL STATUS 06/20/2009  . Allergic rhinitis 04/03/2008  . Essential hypertension 01/25/2008  . G E R D 07/05/2007  . H/O peptic ulcer 07/05/2007  . Headache, chronic daily 07/05/2007  . NEPHROLITHIASIS, HX OF 07/05/2007   Past  Medical History:  Diagnosis Date  . Back pain   . GERD (gastroesophageal reflux disease)   . Headache(784.0)   . History of nephrolithiasis   . Hypertension   . Plantar fasciitis   . Sleep apnea   . Ulcer 1980's   peptic ulcer disease   Past Surgical History:  Procedure Laterality Date  . ABDOMINAL HYSTERECTOMY  2002  . CHOLECYSTECTOMY  1984  . COLONOSCOPY  12/18/2011   Normal  . IR ANGIO EXTERNAL CAROTID SEL EXT CAROTID BILAT MOD SED  11/15/2018  . IR ANGIO INTRA EXTRACRAN SEL INTERNAL CAROTID BILAT MOD SED  11/15/2018  . IR ANGIO VERTEBRAL SEL VERTEBRAL UNI R MOD SED  11/15/2018  . IR US GUIDE VASC ACCESS RIGHT  11/15/2018  . OOPHORECTOMY     Social History   Tobacco Use  . Smoking status: Never Smoker  . Smokeless tobacco: Never Used  Substance Use Topics  . Alcohol use: No    Alcohol/week: 0.0 standard drinks  . Drug use: No   Family History  Problem Relation Age of Onset  . Stroke Father   . Hypertension Father   . Diabetes Father   . Cancer Brother        stomach tumor   Allergies  Allergen Reactions  . Atenolol Cough  . Hydrocodone Nausea Only   Current Outpatient Medications on File Prior to Visit  Medication Sig Dispense Refill  .  amLODipine (NORVASC) 5 MG tablet TAKE 1 TABLET (5 MG TOTAL) BY MOUTH DAILY. 90 tablet 1  . celecoxib (CELEBREX) 200 MG capsule Take 1 capsule (200 mg total) by mouth daily as needed for moderate pain. With food 60 capsule 1  . Cholecalciferol (VITAMIN D3) 2000 UNITS capsule Take 2,000 Units by mouth daily.      Marland Kitchen gabapentin (NEURONTIN) 300 MG capsule Take 300 mg by mouth 3 (three) times daily.    . hydrOXYzine (ATARAX/VISTARIL) 10 MG tablet Take 1 tablet (10 mg total) by mouth 3 (three) times daily as needed. 30 tablet 0  . omeprazole (PRILOSEC) 20 MG capsule Take 1 capsule (20 mg total) by mouth daily as needed. 90 capsule 3  . ondansetron (ZOFRAN) 4 MG tablet Take 1 tablet (4 mg total) by mouth every 8 (eight) hours as  needed. 20 tablet 0  . traMADol (ULTRAM) 50 MG tablet TAKE 1-2 TABLETS (50-100 MG TOTAL) BY MOUTH EVERY 6 (SIX) HOURS AS NEEDED. 30 tablet 0   No current facility-administered medications on file prior to visit.     Review of Systems  Constitutional: Negative for activity change, appetite change, fatigue, fever and unexpected weight change.  HENT: Negative for congestion, ear pain, rhinorrhea, sinus pressure and sore throat.   Eyes: Negative for pain, redness and visual disturbance.  Respiratory: Negative for cough, shortness of breath and wheezing.   Cardiovascular: Negative for chest pain and palpitations.  Gastrointestinal: Negative for abdominal pain, blood in stool, constipation and diarrhea.  Endocrine: Negative for polydipsia and polyuria.  Genitourinary: Negative for dysuria, frequency and urgency.  Musculoskeletal: Positive for back pain. Negative for arthralgias and myalgias.  Skin: Positive for wound. Negative for pallor and rash.  Allergic/Immunologic: Negative for environmental allergies.  Neurological: Negative for dizziness, syncope and headaches.  Hematological: Negative for adenopathy. Does not bruise/bleed easily.  Psychiatric/Behavioral: Negative for decreased concentration and dysphoric mood. The patient is not nervous/anxious.        Objective:   Physical Exam Constitutional:      General: She is not in acute distress.    Appearance: Normal appearance. She is well-developed. She is obese. She is not ill-appearing or diaphoretic.  HENT:     Head: Normocephalic and atraumatic.  Eyes:     General:        Right eye: No discharge.        Left eye: No discharge.     Conjunctiva/sclera: Conjunctivae normal.     Pupils: Pupils are equal, round, and reactive to light.  Neck:     Thyroid: No thyromegaly.     Vascular: No carotid bruit or JVD.  Cardiovascular:     Rate and Rhythm: Normal rate and regular rhythm.     Heart sounds: Normal heart sounds. No gallop.     Pulmonary:     Effort: Pulmonary effort is normal. No respiratory distress.     Breath sounds: Normal breath sounds. No wheezing or rales.  Abdominal:     General: Bowel sounds are normal. There is no distension or abdominal bruit.     Palpations: Abdomen is soft. There is no mass.     Tenderness: There is no abdominal tenderness.     Hernia: No hernia is present.  Musculoskeletal:     Cervical back: Normal range of motion and neck supple.     Right lower leg: No edema.     Left lower leg: No edema.     Comments: Poor rom LS No spinous  process tenderness  Lymphadenopathy:     Cervical: No cervical adenopathy.     Lower Body: No right inguinal adenopathy. No left inguinal adenopathy.  Skin:    General: Skin is warm and dry.     Findings: No rash.     Comments: Boil (2-3 cm) with 2 mm opening in groin area on L between labia majora and thigh  Some clear drainage Mildly tender and erythematous  Not fluctuant now   No other areas noted   Neurological:     Mental Status: She is alert.     Deep Tendon Reflexes: Reflexes are normal and symmetric.  Psychiatric:        Mood and Affect: Mood normal.            Assessment & Plan:   Problem List Items Addressed This Visit      Other   Low back pain    Uses tramadol prn  Seeing spine specialist and considering epidural injections Tramadol refilled with warning of sedation/habit potential       Abscess of left groin - Primary    In area between labia minora and thigh- more painful a day ago until it ruptured/drained  Not much drainage on exam today  tx with keflex tid for 7d and report progress  Enc to keep clean with soap and water  Continue warm compresses  Update if not starting to improve in a week or if worsening

## 2020-01-17 ENCOUNTER — Telehealth: Payer: Self-pay | Admitting: Family Medicine

## 2020-01-17 NOTE — Telephone Encounter (Signed)
Called pharmacy and pt's insurance only covers 90 pills of omeprazole for the whole year. Called pt and advised her to check with insurance and see what GERD med they do cover and let us know so we can change Rx to a covered med, pt verbalized understanding

## 2020-01-17 NOTE — Telephone Encounter (Signed)
Pt called stating insurance will only pay for 90 day supply of omeprzaole.  She wanted to know if you could call something else for her  [facility]

## 2020-01-18 NOTE — Telephone Encounter (Signed)
Pt called back she said that I needed to do an quantity overide on any GERD meds they only cover #90 tabs for a year on all meds. Called [facility] and did the PA over the phone. It was approved and pt and pharmacy notified

## 2020-02-08 ENCOUNTER — Other Ambulatory Visit: Payer: Self-pay | Admitting: Family Medicine

## 2020-02-08 MED ORDER — TRAMADOL HCL 50 MG PO TABS: 50.0000 mg | ORAL_TABLET | Freq: Four times a day (QID) | ORAL | 0 refills | Status: DC | PRN

## 2020-02-08 NOTE — Telephone Encounter (Signed)
Patient called.  Patient called [facility] for a refill on Tramadol.  [facility] was told by [facility] to call our office for refill.  Patient's out of her medication.

## 2020-02-08 NOTE — Telephone Encounter (Signed)
Name of Medication: Tramadol Name of Pharmacy: [facility] or Written Date and Quantity: 07/04/19 #30 tabs with 0 refills Last Office Visit and Type: boil on 12/25/19 Next Office Visit and Type: none scheduled  Last Controlled Substance Agreement Date: 11/13/2014 Last UDS:11/13/14

## 2020-02-18 ENCOUNTER — Other Ambulatory Visit: Payer: Self-pay | Admitting: Family Medicine

## 2020-03-04 DIAGNOSIS — M47816 Spondylosis without myelopathy or radiculopathy, lumbar region: Secondary | ICD-10-CM | POA: Diagnosis not present

## 2020-05-23 ENCOUNTER — Other Ambulatory Visit: Payer: Self-pay | Admitting: Family Medicine

## 2020-05-23 DIAGNOSIS — Z1231 Encounter for screening mammogram for malignant neoplasm of breast: Secondary | ICD-10-CM

## 2020-06-21 ENCOUNTER — Ambulatory Visit
Admission: RE | Admit: 2020-06-21 | Discharge: 2020-06-21 | Disposition: A | Discharge status: Home / Self Care | Payer: BC Managed Care – PPO | Source: Ambulatory Visit | Attending: Family Medicine | Admitting: Family Medicine

## 2020-06-21 ENCOUNTER — Other Ambulatory Visit: Payer: Self-pay

## 2020-06-21 ENCOUNTER — Ambulatory Visit: Payer: BC Managed Care – PPO

## 2020-06-21 DIAGNOSIS — Z1231 Encounter for screening mammogram for malignant neoplasm of breast: Secondary | ICD-10-CM | POA: Diagnosis not present

## 2020-07-04 ENCOUNTER — Telehealth: Payer: Self-pay | Admitting: Family Medicine

## 2020-07-04 NOTE — Telephone Encounter (Signed)
Patient came in office and dropped off disability placard. It was placed on the cart.

## 2020-07-05 NOTE — Telephone Encounter (Signed)
Done and in IN box 

## 2020-07-05 NOTE — Telephone Encounter (Signed)
Pt notified for ready for pick up

## 2020-07-15 DIAGNOSIS — M25561 Pain in right knee: Secondary | ICD-10-CM | POA: Diagnosis not present

## 2020-07-15 DIAGNOSIS — M545 Low back pain: Secondary | ICD-10-CM | POA: Diagnosis not present

## 2020-07-18 DIAGNOSIS — M545 Low back pain: Secondary | ICD-10-CM | POA: Diagnosis not present

## 2020-07-25 DIAGNOSIS — Z20822 Contact with and (suspected) exposure to covid-19: Secondary | ICD-10-CM | POA: Diagnosis not present

## 2020-08-01 DIAGNOSIS — M545 Low back pain: Secondary | ICD-10-CM | POA: Diagnosis not present

## 2020-08-13 ENCOUNTER — Other Ambulatory Visit: Payer: Self-pay | Admitting: Family Medicine

## 2020-08-13 DIAGNOSIS — M47816 Spondylosis without myelopathy or radiculopathy, lumbar region: Secondary | ICD-10-CM | POA: Diagnosis not present

## 2020-08-18 ENCOUNTER — Telehealth: Payer: Self-pay | Admitting: Family Medicine

## 2020-08-18 DIAGNOSIS — E559 Vitamin D deficiency, unspecified: Secondary | ICD-10-CM

## 2020-08-18 DIAGNOSIS — I10 Essential (primary) hypertension: Secondary | ICD-10-CM

## 2020-08-18 NOTE — Telephone Encounter (Signed)
-----   Message from Cloyd Stagers, RT sent at 08/05/2020  2:30 PM EDT ----- Regarding: Lab Orders for Monday 9.13.2021 Please place lab orders for Monday 9.13.2021, office visit for physical on Thursday 9.16.2021 Thank you, Dyke Maes RT(R)

## 2020-08-19 ENCOUNTER — Other Ambulatory Visit (INDEPENDENT_AMBULATORY_CARE_PROVIDER_SITE_OTHER): Payer: BC Managed Care – PPO

## 2020-08-19 ENCOUNTER — Other Ambulatory Visit: Payer: Self-pay

## 2020-08-19 DIAGNOSIS — E559 Vitamin D deficiency, unspecified: Secondary | ICD-10-CM | POA: Diagnosis not present

## 2020-08-19 DIAGNOSIS — I10 Essential (primary) hypertension: Secondary | ICD-10-CM | POA: Diagnosis not present

## 2020-08-19 LAB — LIPID PANEL
Cholesterol: 223 mg/dL — ABNORMAL HIGH (ref 0–200)
HDL: 89.7 mg/dL (ref >39.00)
LDL Cholesterol: 122 mg/dL — ABNORMAL HIGH (ref 0–99)
NonHDL: 132.97
Total CHOL/HDL Ratio: 2
Triglycerides: 54 mg/dL (ref 0.0–149.0)
VLDL: 10.8 mg/dL (ref 0.0–40.0)

## 2020-08-19 LAB — CBC WITH DIFFERENTIAL/PLATELET
Basophils Absolute: 0 10*3/uL (ref 0.0–0.1)
Basophils Relative: 0.5 % (ref 0.0–3.0)
Eosinophils Absolute: 0.1 10*3/uL (ref 0.0–0.7)
Eosinophils Relative: 1.2 % (ref 0.0–5.0)
HCT: 42.7 % (ref 36.0–46.0)
Hemoglobin: 13.8 g/dL (ref 12.0–15.0)
Lymphocytes Relative: 29 % (ref 12.0–46.0)
Lymphs Abs: 2.4 10*3/uL (ref 0.7–4.0)
MCHC: 32.4 g/dL (ref 30.0–36.0)
MCV: 77.9 fl — ABNORMAL LOW (ref 78.0–100.0)
Monocytes Absolute: 0.6 10*3/uL (ref 0.1–1.0)
Monocytes Relative: 7 % (ref 3.0–12.0)
Neutro Abs: 5.2 10*3/uL (ref 1.4–7.7)
Neutrophils Relative %: 62.3 % (ref 43.0–77.0)
Platelets: 240 10*3/uL (ref 150.0–400.0)
RBC: 5.48 Mil/uL — ABNORMAL HIGH (ref 3.87–5.11)
RDW: 14.8 % (ref 11.5–15.5)
WBC: 8.3 10*3/uL (ref 4.0–10.5)

## 2020-08-19 LAB — COMPREHENSIVE METABOLIC PANEL
ALT: 36 U/L — ABNORMAL HIGH (ref 0–35)
AST: 20 U/L (ref 0–37)
Albumin: 4.2 g/dL (ref 3.5–5.2)
Alkaline Phosphatase: 103 U/L (ref 39–117)
BUN: 17 mg/dL (ref 6–23)
CO2: 26 mEq/L (ref 19–32)
Calcium: 9.4 mg/dL (ref 8.4–10.5)
Chloride: 104 mEq/L (ref 96–112)
Creatinine, Ser: 0.86 mg/dL (ref 0.40–1.20)
GFR: 81.1 mL/min (ref >60.00)
Glucose, Bld: 74 mg/dL (ref 70–99)
Potassium: 4.2 mEq/L (ref 3.5–5.1)
Sodium: 139 mEq/L (ref 135–145)
Total Bilirubin: 0.5 mg/dL (ref 0.2–1.2)
Total Protein: 7.3 g/dL (ref 6.0–8.3)

## 2020-08-19 LAB — VITAMIN D 25 HYDROXY (VIT D DEFICIENCY, FRACTURES): VITD: 38.05 ng/mL (ref 30.00–100.00)

## 2020-08-19 LAB — TSH: TSH: 1.63 u[IU]/mL (ref 0.35–4.50)

## 2020-08-22 ENCOUNTER — Ambulatory Visit (INDEPENDENT_AMBULATORY_CARE_PROVIDER_SITE_OTHER): Payer: BC Managed Care – PPO | Admitting: Family Medicine

## 2020-08-22 ENCOUNTER — Other Ambulatory Visit: Payer: Self-pay

## 2020-08-22 ENCOUNTER — Telehealth: Payer: Self-pay | Admitting: Family Medicine

## 2020-08-22 ENCOUNTER — Encounter: Payer: Self-pay | Admitting: Family Medicine

## 2020-08-22 VITALS — BP 130/82 | HR 95 | Temp 97.4°F | Ht 62.5 in | Wt 169.5 lb

## 2020-08-22 DIAGNOSIS — I10 Essential (primary) hypertension: Secondary | ICD-10-CM

## 2020-08-22 DIAGNOSIS — E559 Vitamin D deficiency, unspecified: Secondary | ICD-10-CM

## 2020-08-22 DIAGNOSIS — Z23 Encounter for immunization: Secondary | ICD-10-CM

## 2020-08-22 DIAGNOSIS — Z0000 Encounter for general adult medical examination without abnormal findings: Secondary | ICD-10-CM | POA: Diagnosis not present

## 2020-08-22 NOTE — Progress Notes (Signed)
Subjective:    Patient ID: Mindy Long, female    DOB: Sep 20, 1961, 59 y.o.   MRN: [id number]  This visit occurred during the SARS-CoV-2 public health emergency.  Safety protocols were in place, including screening questions prior to the visit, additional usage of staff PPE, and extensive cleaning of exam room while observing appropriate contact time as indicated for disinfecting solutions.    HPI Here for health maintenance exam and to review chronic medical problems    Wt Readings from Last 3 Encounters:  08/22/20 169 lb 8 oz (76.9 kg)  12/25/19 172 lb 9 oz (78.3 kg)  09/25/19 171 lb 6 oz (77.7 kg)  trying to loose weight  Trying to eat healthy also  30.51 kg/m Just started with spinal injections  Likes to walk when she can   Some pain in r leg-worse at night Has f/ u next week  Doing ok overall   covid status -has been vaccinated  Flu vaccine - today  Tdap 9/20 Zoster status - had shingles but not vaccinated   Mammogram 7/21  Self breast exam - no lumps  Has had a hysterectomy   Colonoscopy 11/13   HTN bp is stable today  No cp or palpitations or headaches or edema  No side effects to medicines  BP Readings from Last 3 Encounters:  08/22/20 130/82  12/25/19 122/78  09/25/19 122/70    Takes amlodipine   occ chest flutters -not palpitation  Drinks one cup of coffee per day    Pulse Readings from Last 3 Encounters:  08/22/20 95  12/25/19 99  09/25/19 95    Vit D def Level is 38.0  Lab Results  Component Value Date   CREATININE 0.86 08/19/2020   BUN 17 08/19/2020   NA 139 08/19/2020   K 4.2 08/19/2020   CL 104 08/19/2020   CO2 26 08/19/2020   Lab Results  Component Value Date   ALT 36 (H) 08/19/2020   AST 20 08/19/2020   ALKPHOS 103 08/19/2020   BILITOT 0.5 08/19/2020   glucose 74  Watching sugar intake as well as salt   Lab Results  Component Value Date   WBC 8.3 08/19/2020   HGB 13.8 08/19/2020   HCT 42.7 08/19/2020   MCV 77.9  (L) 08/19/2020   PLT 240.0 08/19/2020   Lab Results  Component Value Date   TSH 1.63 08/19/2020    Cholesterol  Lab Results  Component Value Date   CHOL 223 (H) 08/19/2020   CHOL 214 (H) 08/18/2019   CHOL 181 08/11/2018   Lab Results  Component Value Date   HDL 89.70 08/19/2020   HDL 90.40 08/18/2019   HDL 75.30 08/11/2018   Lab Results  Component Value Date   LDLCALC 122 (H) 08/19/2020   LDLCALC 103 (H) 08/18/2019   LDLCALC 96 08/11/2018   Lab Results  Component Value Date   TRIG 54.0 08/19/2020   TRIG 101.0 08/18/2019   TRIG 49.0 08/11/2018   Lab Results  Component Value Date   CHOLHDL 2 08/19/2020   CHOLHDL 2 08/18/2019   CHOLHDL 2 08/11/2018   Lab Results  Component Value Date   LDLDIRECT 126.7 10/23/2011   LDLDIRECT 132.9 06/20/2009   LDLDIRECT 109.4 01/25/2008   Does eat some fried food  HDL is great  LDL up a bit    Patient Active Problem List   Diagnosis Date Noted  . Abscess of left groin 12/25/2019  . Right foot pain 09/25/2019  .  Cyst of skin of breast 09/25/2019  . Abnormal serum ACE level 04/08/2018  . Chronic cough 12/14/2017  . Eye exam abnormal 08/04/2017  . Tinnitus 11/13/2014  . History of shingles 04/27/2014  . Low back pain 04/20/2014  . OSA (obstructive sleep apnea) 09/27/2012  . Snoring 06/21/2012  . Fatigue 06/20/2012  . Colon cancer screening 11/04/2011  . Routine general medical examination at a health care facility 10/22/2011  . Chronic foot pain 09/11/2011  . Vitamin D deficiency 07/02/2010  . DISTURBANCE OF SKIN SENSATION 04/07/2010  . PALPITATIONS 04/07/2010  . HYPOKALEMIA 07/10/2009  . PERIMENOPAUSAL STATUS 06/20/2009  . Allergic rhinitis 04/03/2008  . Essential hypertension 01/25/2008  . G E R D 07/05/2007  . H/O peptic ulcer 07/05/2007  . Headache, chronic daily 07/05/2007  . NEPHROLITHIASIS, HX OF 07/05/2007   Past Medical History:  Diagnosis Date  . Back pain   . GERD (gastroesophageal reflux disease)     . Headache(784.0)   . History of nephrolithiasis   . Hypertension   . Plantar fasciitis   . Sleep apnea   . Ulcer 1980's   peptic ulcer disease   Past Surgical History:  Procedure Laterality Date  . ABDOMINAL HYSTERECTOMY  2002  . CHOLECYSTECTOMY  1984  . COLONOSCOPY  12/18/2011   Normal  . IR ANGIO EXTERNAL CAROTID SEL EXT CAROTID BILAT MOD SED  11/15/2018  . IR ANGIO INTRA EXTRACRAN SEL INTERNAL CAROTID BILAT MOD SED  11/15/2018  . IR ANGIO VERTEBRAL SEL VERTEBRAL UNI R MOD SED  11/15/2018  . IR US GUIDE VASC ACCESS RIGHT  11/15/2018  . OOPHORECTOMY     Social History   Tobacco Use  . Smoking status: Never Smoker  . Smokeless tobacco: Never Used  Substance Use Topics  . Alcohol use: No    Alcohol/week: 0.0 standard drinks  . Drug use: No   Family History  Problem Relation Age of Onset  . Stroke Father   . Hypertension Father   . Diabetes Father   . Cancer Brother        stomach tumor   Allergies  Allergen Reactions  . Atenolol Cough  . Hydrocodone Nausea Only   Current Outpatient Medications on File Prior to Visit  Medication Sig Dispense Refill  . amLODipine (NORVASC) 5 MG tablet TAKE 1 TABLET BY MOUTH EVERY DAY 90 tablet 1  . celecoxib (CELEBREX) 200 MG capsule Take 1 capsule (200 mg total) by mouth daily as needed for moderate pain. With food 60 capsule 1  . Cholecalciferol (VITAMIN D3) 2000 UNITS capsule Take 2,000 Units by mouth daily.      Marland Kitchen gabapentin (NEURONTIN) 300 MG capsule Take 300 mg by mouth 3 (three) times daily.    . hydrOXYzine (ATARAX/VISTARIL) 10 MG tablet Take 1 tablet (10 mg total) by mouth 3 (three) times daily as needed. 30 tablet 0  . omeprazole (PRILOSEC) 20 MG capsule Take 1 capsule (20 mg total) by mouth daily as needed. 90 capsule 3  . ondansetron (ZOFRAN) 4 MG tablet Take 1 tablet (4 mg total) by mouth every 8 (eight) hours as needed. 20 tablet 0  . tiZANidine (ZANAFLEX) 4 MG tablet Take 4 mg by mouth 2 (two) times daily as needed.      No current facility-administered medications on file prior to visit.     Review of Systems  Constitutional: Negative for activity change, appetite change, fatigue, fever and unexpected weight change.  HENT: Negative for congestion, ear pain, rhinorrhea, sinus pressure and  sore throat.   Eyes: Negative for pain, redness and visual disturbance.  Respiratory: Negative for cough, shortness of breath and wheezing.   Cardiovascular: Negative for chest pain and palpitations.  Gastrointestinal: Negative for abdominal pain, blood in stool, constipation and diarrhea.  Endocrine: Negative for polydipsia and polyuria.  Genitourinary: Negative for dysuria, frequency and urgency.  Musculoskeletal: Negative for arthralgias, back pain and myalgias.  Skin: Negative for pallor and rash.  Allergic/Immunologic: Negative for environmental allergies.  Neurological: Negative for dizziness, syncope and headaches.  Hematological: Negative for adenopathy. Does not bruise/bleed easily.  Psychiatric/Behavioral: Negative for decreased concentration and dysphoric mood. The patient is not nervous/anxious.        Objective:   Physical Exam Constitutional:      General: She is not in acute distress.    Appearance: Normal appearance. She is well-developed. She is obese. She is not ill-appearing or diaphoretic.  HENT:     Head: Normocephalic and atraumatic.     Right Ear: Tympanic membrane, ear canal and external ear normal.     Left Ear: Tympanic membrane, ear canal and external ear normal.     Nose: Nose normal. No congestion.     Mouth/Throat:     Mouth: Mucous membranes are moist.     Pharynx: Oropharynx is clear. No posterior oropharyngeal erythema.  Eyes:     General: No scleral icterus.    Extraocular Movements: Extraocular movements intact.     Conjunctiva/sclera: Conjunctivae normal.     Pupils: Pupils are equal, round, and reactive to light.  Neck:     Thyroid: No thyromegaly.     Vascular: No  carotid bruit or JVD.  Cardiovascular:     Rate and Rhythm: Normal rate and regular rhythm.     Pulses: Normal pulses.     Heart sounds: Normal heart sounds. No gallop.   Pulmonary:     Effort: Pulmonary effort is normal. No respiratory distress.     Breath sounds: Normal breath sounds. No wheezing.     Comments: Good air exch Chest:     Chest wall: No tenderness.  Abdominal:     General: Bowel sounds are normal. There is no distension or abdominal bruit.     Palpations: Abdomen is soft. There is no mass.     Tenderness: There is no abdominal tenderness.     Hernia: No hernia is present.  Genitourinary:    Comments: Breast exam: No mass, nodules, thickening, tenderness, bulging, retraction, inflamation, nipple discharge or skin changes noted.  No axillary or clavicular LA.     Musculoskeletal:        General: No tenderness. Normal range of motion.     Cervical back: Normal range of motion and neck supple. No rigidity. No muscular tenderness.     Right lower leg: No edema.     Left lower leg: No edema.  Lymphadenopathy:     Cervical: No cervical adenopathy.  Skin:    General: Skin is warm and dry.     Coloration: Skin is not pale.     Findings: No erythema or rash.  Neurological:     Mental Status: She is alert. Mental status is at baseline.     Cranial Nerves: No cranial nerve deficit.     Sensory: No sensory deficit.     Motor: No abnormal muscle tone.     Coordination: Coordination normal.     Gait: Gait normal.     Deep Tendon Reflexes: Reflexes are normal and symmetric.  Reflexes normal.  Psychiatric:        Mood and Affect: Mood normal.        Cognition and Memory: Cognition and memory normal.           Assessment & Plan:   Problem List Items Addressed This Visit      Cardiovascular and Mediastinum   Essential hypertension    bp in fair control at this time  BP Readings from Last 1 Encounters:  08/22/20 130/82   No changes needed Most recent labs reviewed   Disc lifstyle change with low sodium diet and exercise          Other   Vitamin D deficiency    Level of 38 Vitamin D level is therapeutic with current supplementation Disc importance of this to bone and overall health       Routine general medical examination at a health care facility - Primary    Reviewed health habits including diet and exercise and skin cancer prevention Reviewed appropriate screening tests for age  Also reviewed health mt list, fam hx and immunization status , as well as social and family history   See HPI Labs reviewed  Flu shot given  covid immunized  Discussed shingrix vaccine  Mammogram is utd Encouraged healthy diet and exercise          Relevant Orders   Flu Vaccine QUAD 6+ mos PF IM (Fluarix Quad PF) (Completed)    Other Visit Diagnoses    Need for influenza vaccination       Relevant Orders   Flu Vaccine QUAD 6+ mos PF IM (Fluarix Quad PF) (Completed)

## 2020-08-22 NOTE — Assessment & Plan Note (Signed)
Level of 38 Vitamin D level is therapeutic with current supplementation Disc importance of this to bone and overall health

## 2020-08-22 NOTE — Telephone Encounter (Signed)
Pt called she received lab results today at and has question

## 2020-08-22 NOTE — Patient Instructions (Addendum)
If you are interested in the shingles vaccine series (Shingrix), call your insurance or pharmacy to check on coverage and location it must be given.  If affordable - you can schedule it here or at your pharmacy depending on coverage   Take care of yourself  Try to eat healthy  Do whatever exercise you can   Flu shot today   Labs are stable   COVID-19 Vaccine Information can be found at: [email or web address].uk For questions related to vaccine distribution or appointments, please email [email or web address] or call [phone].

## 2020-08-22 NOTE — Assessment & Plan Note (Signed)
Reviewed health habits including diet and exercise and skin cancer prevention Reviewed appropriate screening tests for age  Also reviewed health mt list, fam hx and immunization status , as well as social and family history   See HPI Labs reviewed  Flu shot given  covid immunized  Discussed shingrix vaccine  Mammogram is utd Encouraged healthy diet and exercise

## 2020-08-22 NOTE — Assessment & Plan Note (Signed)
bp in fair control at this time  BP Readings from Last 1 Encounters:  08/22/20 130/82   No changes needed Most recent labs reviewed  Disc lifstyle change with low sodium diet and exercise

## 2020-08-26 NOTE — Telephone Encounter (Signed)
Pt called and asked why we didn't do an A1c, I advise pt she's not a diabetic and Blood sugars have been in normal range so there would be no indication to do one. Pt verbalized understanding she was just checking

## 2020-08-28 DIAGNOSIS — M47816 Spondylosis without myelopathy or radiculopathy, lumbar region: Secondary | ICD-10-CM | POA: Diagnosis not present

## 2020-08-28 DIAGNOSIS — M5416 Radiculopathy, lumbar region: Secondary | ICD-10-CM | POA: Diagnosis not present

## 2020-09-10 DIAGNOSIS — M5416 Radiculopathy, lumbar region: Secondary | ICD-10-CM | POA: Diagnosis not present

## 2020-09-10 DIAGNOSIS — M47816 Spondylosis without myelopathy or radiculopathy, lumbar region: Secondary | ICD-10-CM | POA: Diagnosis not present

## 2020-09-25 ENCOUNTER — Other Ambulatory Visit: Payer: Self-pay | Admitting: Family Medicine

## 2020-09-25 DIAGNOSIS — M47816 Spondylosis without myelopathy or radiculopathy, lumbar region: Secondary | ICD-10-CM | POA: Diagnosis not present

## 2020-09-25 DIAGNOSIS — M5416 Radiculopathy, lumbar region: Secondary | ICD-10-CM | POA: Diagnosis not present

## 2020-09-25 NOTE — Telephone Encounter (Signed)
CPE was on 08/22/20, last filled on1/6/21 #60 caps with 1 refill

## 2020-10-17 ENCOUNTER — Other Ambulatory Visit: Payer: Self-pay | Admitting: Family Medicine

## 2020-10-28 ENCOUNTER — Ambulatory Visit (INDEPENDENT_AMBULATORY_CARE_PROVIDER_SITE_OTHER): Payer: BC Managed Care – PPO | Admitting: Primary Care

## 2020-10-28 ENCOUNTER — Other Ambulatory Visit: Payer: Self-pay

## 2020-10-28 ENCOUNTER — Encounter: Payer: Self-pay | Admitting: Primary Care

## 2020-10-28 VITALS — BP 130/84 | HR 100 | Temp 97.6°F | Ht 62.5 in | Wt 170.0 lb

## 2020-10-28 DIAGNOSIS — R229 Localized swelling, mass and lump, unspecified: Secondary | ICD-10-CM | POA: Diagnosis not present

## 2020-10-28 NOTE — Patient Instructions (Signed)
You will be contacted regarding your ultrasound.  Please let us know if you have not been contacted within one week.  It was a pleasure meeting you!

## 2020-10-28 NOTE — Progress Notes (Signed)
Subjective:    Patient ID: Mindy Long Long, female    DOB: [birth year], 59 y.o.   MRN: [id number]  HPI  This visit occurred during the SARS-CoV-2 public health emergency.  Safety protocols were in place, including screening questions prior to the visit, additional usage of staff PPE, and extensive cleaning of exam room while observing appropriate contact time as indicated for disinfecting solutions.   Mindy Long Long is a 58 year old female patient of Dr. Glori Bickers with a history of hypertension, OSA, breast cyst, shingles who presents today with a chief complaint of skin mass.   Her skin mass is located to the left lower buttocks that she first noticed two weeks ago. The mass is not painful and she denies drainage, erythema, increase in size, fevers. Her husband took a look at the site last week and didn't notice infection. She has never noticed this mass before.   Review of Systems  Constitutional: Negative for fever.  Skin: Negative for color change and wound.       Skin mass       Past Medical History:  Diagnosis Date  . Back pain   . GERD (gastroesophageal reflux disease)   . Headache(784.0)   . History of nephrolithiasis   . Hypertension   . Plantar fasciitis   . Sleep apnea   . Ulcer 1980's   peptic ulcer disease     Social History   Socioeconomic History  . Marital status: Married    Spouse name: Not on file  . Number of children: Not on file  . Years of education: Not on file  . Highest education level: Not on file  Occupational History  . Occupation: ar specialist  Tobacco Use  . Smoking status: Never Smoker  . Smokeless tobacco: Never Used  Substance and Sexual Activity  . Alcohol use: No    Alcohol/week: 0.0 standard drinks  . Drug use: No  . Sexual activity: Not on file  Other Topics Concern  . Not on file  Social History Narrative   Walks for exercise   Social Determinants of Health   Financial Resource Strain:   . Difficulty of Paying Living  Expenses: Not on file  Food Insecurity:   . Worried About Charity fundraiser in the Last Year: Not on file  . Ran Out of Food in the Last Year: Not on file  Transportation Needs:   . Lack of Transportation (Medical): Not on file  . Lack of Transportation (Non-Medical): Not on file  Physical Activity:   . Days of Exercise per Week: Not on file  . Minutes of Exercise per Session: Not on file  Stress:   . Feeling of Stress : Not on file  Social Connections:   . Frequency of Communication with Friends and Family: Not on file  . Frequency of Social Gatherings with Friends and Family: Not on file  . Attends Religious Services: Not on file  . Active Member of Clubs or Organizations: Not on file  . Attends Archivist Meetings: Not on file  . Marital Status: Not on file  Intimate Partner Violence:   . Fear of Current or Ex-Partner: Not on file  . Emotionally Abused: Not on file  . Physically Abused: Not on file  . Sexually Abused: Not on file    Past Surgical History:  Procedure Laterality Date  . ABDOMINAL HYSTERECTOMY  2002  . CHOLECYSTECTOMY  1984  . COLONOSCOPY  12/18/2011   Normal  .  IR ANGIO EXTERNAL CAROTID SEL EXT CAROTID BILAT MOD SED  11/15/2018  . IR ANGIO INTRA EXTRACRAN SEL INTERNAL CAROTID BILAT MOD SED  11/15/2018  . IR ANGIO VERTEBRAL SEL VERTEBRAL UNI R MOD SED  11/15/2018  . IR US GUIDE VASC ACCESS RIGHT  11/15/2018  . OOPHORECTOMY      Family History  Problem Relation Age of Onset  . Stroke Father   . Hypertension Father   . Diabetes Father   . Cancer Brother        stomach tumor    Allergies  Allergen Reactions  . Atenolol Cough  . Hydrocodone Nausea Only    Current Outpatient Medications on File Prior to Visit  Medication Sig Dispense Refill  . amLODipine (NORVASC) 5 MG tablet TAKE 1 TABLET BY MOUTH EVERY DAY 90 tablet 1  . celecoxib (CELEBREX) 200 MG capsule TAKE 1 CAPSULE (200 MG TOTAL) BY MOUTH DAILY AS NEEDED FOR MODERATE PAIN. WITH  FOOD 30 capsule 2  . Cholecalciferol (VITAMIN D3) 2000 UNITS capsule Take 2,000 Units by mouth daily.      Marland Kitchen omeprazole (PRILOSEC) 20 MG capsule TAKE 1 CAPSULE (20 MG TOTAL) BY MOUTH DAILY AS NEEDED. 270 capsule 1  . tiZANidine (ZANAFLEX) 4 MG tablet Take 4 mg by mouth 2 (two) times daily as needed.    . gabapentin (NEURONTIN) 300 MG capsule Take 300 mg by mouth 3 (three) times daily. (Patient not taking: Reported on 10/28/2020)    . hydrOXYzine (ATARAX/VISTARIL) 10 MG tablet Take 1 tablet (10 mg total) by mouth 3 (three) times daily as needed. (Patient not taking: Reported on 10/28/2020) 30 tablet 0  . ondansetron (ZOFRAN) 4 MG tablet Take 1 tablet (4 mg total) by mouth every 8 (eight) hours as needed. (Patient not taking: Reported on 10/28/2020) 20 tablet 0   No current facility-administered medications on file prior to visit.    BP 130/84   Pulse 100   Temp 97.6 F (36.4 C) (Temporal)   Ht 5' 2.5" (1.588 m)   Wt 170 lb (77.1 kg)   SpO2 97%   BMI 30.60 kg/m    Objective:   Physical Exam Skin:    General: Skin is warm and dry.     Findings: No erythema.     Comments: Deeper subcutaneous skin mass measuring 1 cm in diameter to left mid buttocks. Non tender. Firm, immobile. No surrounding erythema.   Neurological:     Mental Status: She is alert.            Assessment & Plan:

## 2020-10-28 NOTE — Assessment & Plan Note (Signed)
Non infectious and non tender mass noted. Suspect benign cyst, but will order soft tissue ultrasound for further evaluation.   Orders placed. Await results.

## 2020-10-29 ENCOUNTER — Telehealth: Payer: Self-pay | Admitting: *Deleted

## 2020-10-29 DIAGNOSIS — R229 Localized swelling, mass and lump, unspecified: Secondary | ICD-10-CM

## 2020-10-29 NOTE — Telephone Encounter (Signed)
The mass is soft tissue. Will place order for Pelvic limited as requested.

## 2020-10-29 NOTE — Telephone Encounter (Signed)
Mindy Long at [facility] called stating that they need the order changed to a Pelvic limited ultrasound. [name] stated that she keep a look out for the order change.

## 2020-11-13 ENCOUNTER — Ambulatory Visit
Admission: RE | Admit: 2020-11-13 | Discharge: 2020-11-13 | Disposition: A | Discharge status: Home / Self Care | Payer: BC Managed Care – PPO | Source: Ambulatory Visit | Attending: Primary Care | Admitting: Primary Care

## 2020-11-13 DIAGNOSIS — R222 Localized swelling, mass and lump, trunk: Secondary | ICD-10-CM | POA: Diagnosis not present

## 2020-11-13 DIAGNOSIS — R229 Localized swelling, mass and lump, unspecified: Secondary | ICD-10-CM

## 2020-11-24 ENCOUNTER — Other Ambulatory Visit: Payer: Self-pay

## 2020-11-24 ENCOUNTER — Encounter (HOSPITAL_COMMUNITY): Payer: Self-pay | Admitting: *Deleted

## 2020-11-24 ENCOUNTER — Ambulatory Visit (HOSPITAL_COMMUNITY)
Admission: EM | Admit: 2020-11-24 | Discharge: 2020-11-24 | Disposition: A | Discharge status: Home / Self Care | Payer: BC Managed Care – PPO | Attending: Family Medicine | Admitting: Family Medicine

## 2020-11-24 DIAGNOSIS — S39012A Strain of muscle, fascia and tendon of lower back, initial encounter: Secondary | ICD-10-CM

## 2020-11-24 HISTORY — DX: Other chronic pain: G89.29

## 2020-11-24 MED ORDER — TRIAMCINOLONE ACETONIDE 40 MG/ML IJ SUSP
40.0000 mg | Freq: Once | INTRAMUSCULAR | Status: AC
  Administered 2020-11-24: 40 mg via INTRAMUSCULAR

## 2020-11-24 MED ORDER — TIZANIDINE HCL 4 MG PO TABS: 4.0000 mg | ORAL_TABLET | Freq: Two times a day (BID) | ORAL | 0 refills | Status: DC | PRN

## 2020-11-24 MED ORDER — PREDNISONE 50 MG PO TABS: ORAL_TABLET | ORAL | 0 refills | Status: DC

## 2020-11-24 MED ORDER — TRIAMCINOLONE ACETONIDE 40 MG/ML IJ SUSP
INTRAMUSCULAR | Status: AC
  Filled 2020-11-24: qty 1

## 2020-11-24 NOTE — ED Triage Notes (Signed)
Pt c/o chronic back pain; states started with flare-up of acute pain 2 days ago after "doing things around the house".  States has been taking tizanadine without relief; called her MD on-call - was told to come to [facility] to see if she can get meds to get by until f/u.  Denies any parasthesias.

## 2020-11-24 NOTE — ED Provider Notes (Signed)
[place]    CSN: [id number] Arrival date & time: 11/24/20  1103      History   Chief Complaint Chief Complaint  Patient presents with  . Back Pain    HPI Mindy Long is a 59 y.o. female.   Patient presenting today with localized left lateral low back pain x 2 days that is severe. Pain is worse with movement and weight bearing. States she was doing some chores around the house prior to onset and thinks she overdid it. Denies radiation down the leg, numbness or tingling, swelling, central back pain, urinary sxs, fever, bowel or bladder incontinence. So far taking celebrex, tizanidine and using heating pad all without benefit. Sees a back specialist for injections but her issues are all related to the right side of her back. She is trying to get an appt next week with them.      Past Medical History:  Diagnosis Date  . Back pain   . Chronic back pain   . GERD (gastroesophageal reflux disease)   . Headache(784.0)   . History of nephrolithiasis   . Hypertension   . Plantar fasciitis   . Sleep apnea   . Ulcer 1980's   peptic ulcer disease    Patient Active Problem List   Diagnosis Date Noted  . Localized skin mass, lump, or swelling 10/28/2020  . Cyst of skin of breast 09/25/2019  . Abnormal serum ACE level 04/08/2018  . Eye exam abnormal 08/04/2017  . Tinnitus 11/13/2014  . History of shingles 04/27/2014  . Low back pain 04/20/2014  . OSA (obstructive sleep apnea) 09/27/2012  . Snoring 06/21/2012  . Fatigue 06/20/2012  . Colon cancer screening 11/04/2011  . Routine general medical examination at a health care facility 10/22/2011  . Vitamin D deficiency 07/02/2010  . HYPOKALEMIA 07/10/2009  . Allergic rhinitis 04/03/2008  . Essential hypertension 01/25/2008  . G E R D 07/05/2007  . H/O peptic ulcer 07/05/2007  . Headache, chronic daily 07/05/2007  . NEPHROLITHIASIS, HX OF 07/05/2007    Past Surgical History:  Procedure Laterality Date  .  ABDOMINAL HYSTERECTOMY  2002  . CHOLECYSTECTOMY  1984  . COLONOSCOPY  12/18/2011   Normal  . IR ANGIO EXTERNAL CAROTID SEL EXT CAROTID BILAT MOD SED  11/15/2018  . IR ANGIO INTRA EXTRACRAN SEL INTERNAL CAROTID BILAT MOD SED  11/15/2018  . IR ANGIO VERTEBRAL SEL VERTEBRAL UNI R MOD SED  11/15/2018  . IR US GUIDE VASC ACCESS RIGHT  11/15/2018  . OOPHORECTOMY      OB History    Gravida  3   Para  3   Term  3   Preterm      AB      Living  3     SAB      IAB      Ectopic      Multiple      Live Births               Home Medications    Prior to Admission medications   Medication Sig Start Date End Date Taking? Authorizing Provider  amLODipine (NORVASC) 5 MG tablet TAKE 1 TABLET BY MOUTH EVERY DAY 08/13/20  Yes Tower, Marne A, MD  celecoxib (CELEBREX) 200 MG capsule TAKE 1 CAPSULE (200 MG TOTAL) BY MOUTH DAILY AS NEEDED FOR MODERATE PAIN. WITH FOOD 09/25/20  Yes Tower, Wynelle Fanny, MD  Cholecalciferol (VITAMIN D3) 2000 UNITS capsule Take 2,000 Units by mouth daily.  Yes [provider]  omeprazole (PRILOSEC) 20 MG capsule TAKE 1 CAPSULE (20 MG TOTAL) BY MOUTH DAILY AS NEEDED. 10/17/20  Yes Tower, Marne A, MD  gabapentin (NEURONTIN) 300 MG capsule Take 300 mg by mouth 3 (three) times daily. Patient not taking: No sig reported    [provider]  hydrOXYzine (ATARAX/VISTARIL) 10 MG tablet Take 1 tablet (10 mg total) by mouth 3 (three) times daily as needed. Patient not taking: No sig reported 03/16/18   Tower, Wynelle Fanny, MD  ondansetron (ZOFRAN) 4 MG tablet Take 1 tablet (4 mg total) by mouth every 8 (eight) hours as needed. Patient not taking: No sig reported 11/01/17   Jearld Fenton, NP  predniSONE (DELTASONE) 50 MG tablet Take 1 tab daily with breakfast for 3 days 11/24/20   Volney American, PA-C  tiZANidine (ZANAFLEX) 4 MG tablet Take 1 tablet (4 mg total) by mouth 2 (two) times daily as needed. DO NOT DRINK ALCOHOL OR DRIVE WHILE TAKING THIS  MEDICATION 11/24/20   Volney American, PA-C    Family History Family History  Problem Relation Age of Onset  . Stroke Father   . Hypertension Father   . Diabetes Father   . Cancer Brother        stomach tumor    Social History Social History   Tobacco Use  . Smoking status: Never Smoker  . Smokeless tobacco: Never Used  Vaping Use  . Vaping Use: Every day  Substance Use Topics  . Alcohol use: No  . Drug use: No     Allergies   Atenolol and Hydrocodone   Review of Systems Review of Systems PER HPI    Physical Exam Triage Vital Signs ED Triage Vitals  Enc Vitals Group     BP 11/24/20 1242 (!) 174/90     Pulse Rate 11/24/20 1242 77     Resp 11/24/20 1242 18     Temp 11/24/20 1242 (!) 97.4 F (36.3 C)     Temp Source 11/24/20 1242 Temporal     SpO2 11/24/20 1242 100 %     Weight --      Height --      Head Circumference --      Peak Flow --      Pain Score 11/24/20 1243 10     Pain Loc --      Pain Edu? --      Excl. in [place]? --    No data found.  Updated Vital Signs BP (!) 174/90   Pulse 77   Temp (!) 97.4 F (36.3 C) (Temporal)   Resp 18   SpO2 100%   Visual Acuity Right Eye Distance:   Left Eye Distance:   Bilateral Distance:    Right Eye Near:   Left Eye Near:    Bilateral Near:     Physical Exam Vitals and nursing note reviewed.  Constitutional:      Appearance: Normal appearance. She is not ill-appearing.  HENT:     Head: Atraumatic.     Mouth/Throat:     Mouth: Mucous membranes are moist.     Pharynx: Oropharynx is clear.  Eyes:     Extraocular Movements: Extraocular movements intact.     Conjunctiva/sclera: Conjunctivae normal.  Cardiovascular:     Rate and Rhythm: Normal rate and regular rhythm.     Heart sounds: Normal heart sounds.  Pulmonary:     Effort: Pulmonary effort is normal.     Breath  sounds: Normal breath sounds.  Abdominal:     General: Bowel sounds are normal. There is no distension.      Palpations: Abdomen is soft.     Tenderness: There is no abdominal tenderness. There is no guarding.  Musculoskeletal:        General: Tenderness (localized left lateral ttp ) present. No swelling or deformity. Normal range of motion.     Cervical back: Normal range of motion and neck supple.     Comments: - SLR b/l Good strength and ROM of left LE though painful  Skin:    General: Skin is warm and dry.     Findings: No erythema.  Neurological:     Mental Status: She is alert and oriented to person, place, and time.     Motor: No weakness.     Gait: Gait abnormal (antalgic).     Comments: B/l LEs neurovascularly intact  Psychiatric:        Mood and Affect: Mood normal.        Thought Content: Thought content normal.        Judgment: Judgment normal.     UC Treatments / Results  Labs (all labs ordered are listed, but only abnormal results are displayed) Labs Reviewed - No data to display  EKG   Radiology No results found.  Procedures Procedures (including critical care time)  Medications Ordered in UC Medications  triamcinolone acetonide (KENALOG-40) injection 40 mg (40 mg Intramuscular Given 11/24/20 1400)    Initial Impression / Assessment and Plan / UC Course  I have reviewed the triage vital signs and the nursing notes.  Pertinent labs & imaging results that were available during my care of the patient were reviewed by me and considered in my medical decision making (see chart for details).     Suspect muscular pain based on exam, and vitals stable and she is overall well appearing otherwise. Tx with IM kenalog, short course of prednisone at home, and refilled zanaflex as she was running out at home. Stretches, heat, rest, f/u with specialist as soon as able for recheck. Return precautions reviewed.   Final Clinical Impressions(s) / UC Diagnoses   Final diagnoses:  Strain of lumbar region, initial encounter   Discharge Instructions   None    ED  Prescriptions    Medication Sig Dispense Auth. Provider   tiZANidine (ZANAFLEX) 4 MG tablet Take 1 tablet (4 mg total) by mouth 2 (two) times daily as needed. DO NOT DRINK ALCOHOL OR DRIVE WHILE TAKING THIS MEDICATION 20 tablet Volney American, PA-C   predniSONE (DELTASONE) 50 MG tablet Take 1 tab daily with breakfast for 3 days 3 tablet Volney American, [place]     PDMP not reviewed this encounter.   Volney American, [place] 11/24/20 [phone]

## 2020-11-25 NOTE — Telephone Encounter (Signed)
Sounds like a lipoma most likely.  Would recommend ref to derm or gen surg (depending on size) to eval further and see if it needs removal   Thanks so much for seeing her!

## 2020-11-25 NOTE — Telephone Encounter (Signed)
Upon exam this appeared to be a lipoma, it looks like the ultrasound has picked this up as well. I will forward to PCP for a look and input. Marne, we can call her back once you've taken a look.

## 2020-11-25 NOTE — Telephone Encounter (Signed)
Do you want me to call patient or send to [facility] to review?

## 2020-11-25 NOTE — Telephone Encounter (Signed)
Patient called back in in regards to [place] results. Stated she is wanting to know what else to do and go over results with her as she is not entirely sure what it means. Please advise.

## 2020-11-25 NOTE — Telephone Encounter (Signed)
Joellen, please call patient and mention both my comments and Dr. Alba Cory comments. Let me know if she would be interested in seeing general surgery for removal.

## 2020-11-26 NOTE — Telephone Encounter (Signed)
Noted, referral placed.  

## 2020-11-26 NOTE — Telephone Encounter (Signed)
Spoke to patient she would like referral to general surgery in .

## 2020-11-26 NOTE — Addendum Note (Signed)
Addended by: Pleas Koch on: 11/26/2020 01:46 PM   Modules accepted: Orders

## 2020-12-09 ENCOUNTER — Other Ambulatory Visit: Payer: BC Managed Care – PPO

## 2020-12-09 DIAGNOSIS — Z20822 Contact with and (suspected) exposure to covid-19: Secondary | ICD-10-CM | POA: Diagnosis not present

## 2020-12-10 DIAGNOSIS — Z20822 Contact with and (suspected) exposure to covid-19: Secondary | ICD-10-CM | POA: Diagnosis not present

## 2020-12-10 LAB — SARS-COV-2, NAA 2 DAY TAT

## 2020-12-10 LAB — NOVEL CORONAVIRUS, NAA: SARS-CoV-2, NAA: NOT DETECTED

## 2020-12-18 DIAGNOSIS — D171 Benign lipomatous neoplasm of skin and subcutaneous tissue of trunk: Secondary | ICD-10-CM | POA: Diagnosis not present

## 2020-12-19 DIAGNOSIS — M5416 Radiculopathy, lumbar region: Secondary | ICD-10-CM | POA: Diagnosis not present

## 2020-12-24 DIAGNOSIS — M47816 Spondylosis without myelopathy or radiculopathy, lumbar region: Secondary | ICD-10-CM | POA: Diagnosis not present

## 2020-12-25 ENCOUNTER — Other Ambulatory Visit: Payer: Self-pay | Admitting: Surgery

## 2020-12-25 DIAGNOSIS — D171 Benign lipomatous neoplasm of skin and subcutaneous tissue of trunk: Secondary | ICD-10-CM

## 2021-01-07 ENCOUNTER — Ambulatory Visit
Admission: RE | Admit: 2021-01-07 | Discharge: 2021-01-07 | Disposition: A | Discharge status: Home / Self Care | Payer: BC Managed Care – PPO | Source: Ambulatory Visit | Attending: Surgery | Admitting: Surgery

## 2021-01-07 DIAGNOSIS — N3289 Other specified disorders of bladder: Secondary | ICD-10-CM | POA: Diagnosis not present

## 2021-01-07 DIAGNOSIS — D171 Benign lipomatous neoplasm of skin and subcutaneous tissue of trunk: Secondary | ICD-10-CM

## 2021-01-07 MED ORDER — IOPAMIDOL (ISOVUE-300) INJECTION 61%
100.0000 mL | Freq: Once | INTRAVENOUS | Status: AC | PRN
  Administered 2021-01-07: 100 mL via INTRAVENOUS

## 2021-01-08 DIAGNOSIS — M47816 Spondylosis without myelopathy or radiculopathy, lumbar region: Secondary | ICD-10-CM | POA: Diagnosis not present

## 2021-01-17 DIAGNOSIS — M5116 Intervertebral disc disorders with radiculopathy, lumbar region: Secondary | ICD-10-CM | POA: Diagnosis not present

## 2021-01-17 DIAGNOSIS — M9953 Intervertebral disc stenosis of neural canal of lumbar region: Secondary | ICD-10-CM | POA: Diagnosis not present

## 2021-01-23 DIAGNOSIS — M5116 Intervertebral disc disorders with radiculopathy, lumbar region: Secondary | ICD-10-CM | POA: Diagnosis not present

## 2021-01-23 DIAGNOSIS — M9953 Intervertebral disc stenosis of neural canal of lumbar region: Secondary | ICD-10-CM | POA: Diagnosis not present

## 2021-01-26 ENCOUNTER — Other Ambulatory Visit: Payer: Self-pay | Admitting: Family Medicine

## 2021-01-27 NOTE — Telephone Encounter (Signed)
CPE was on 08/22/20. Last filled on 09/25/20 #30 caps with 2 refills

## 2021-01-30 DIAGNOSIS — M9953 Intervertebral disc stenosis of neural canal of lumbar region: Secondary | ICD-10-CM | POA: Diagnosis not present

## 2021-01-30 DIAGNOSIS — M5116 Intervertebral disc disorders with radiculopathy, lumbar region: Secondary | ICD-10-CM | POA: Diagnosis not present

## 2021-02-11 DIAGNOSIS — M9953 Intervertebral disc stenosis of neural canal of lumbar region: Secondary | ICD-10-CM | POA: Diagnosis not present

## 2021-02-11 DIAGNOSIS — M5116 Intervertebral disc disorders with radiculopathy, lumbar region: Secondary | ICD-10-CM | POA: Diagnosis not present

## 2021-02-18 ENCOUNTER — Other Ambulatory Visit: Payer: Self-pay | Admitting: Family Medicine

## 2021-02-20 DIAGNOSIS — M5116 Intervertebral disc disorders with radiculopathy, lumbar region: Secondary | ICD-10-CM | POA: Diagnosis not present

## 2021-02-20 DIAGNOSIS — M9953 Intervertebral disc stenosis of neural canal of lumbar region: Secondary | ICD-10-CM | POA: Diagnosis not present

## 2021-02-26 DIAGNOSIS — D171 Benign lipomatous neoplasm of skin and subcutaneous tissue of trunk: Secondary | ICD-10-CM | POA: Diagnosis not present

## 2021-02-27 DIAGNOSIS — M9953 Intervertebral disc stenosis of neural canal of lumbar region: Secondary | ICD-10-CM | POA: Diagnosis not present

## 2021-02-27 DIAGNOSIS — M5116 Intervertebral disc disorders with radiculopathy, lumbar region: Secondary | ICD-10-CM | POA: Diagnosis not present

## 2021-03-17 ENCOUNTER — Telehealth: Payer: Self-pay

## 2021-03-17 ENCOUNTER — Ambulatory Visit
Admission: EM | Admit: 2021-03-17 | Discharge: 2021-03-17 | Disposition: A | Discharge status: Home / Self Care | Payer: BC Managed Care – PPO | Attending: Emergency Medicine | Admitting: Emergency Medicine

## 2021-03-17 ENCOUNTER — Emergency Department: Payer: BC Managed Care – PPO

## 2021-03-17 ENCOUNTER — Emergency Department
Admission: EM | Admit: 2021-03-17 | Discharge: 2021-03-17 | Disposition: A | Discharge status: Home / Self Care | Payer: BC Managed Care – PPO | Attending: Emergency Medicine | Admitting: Emergency Medicine

## 2021-03-17 ENCOUNTER — Other Ambulatory Visit: Payer: Self-pay

## 2021-03-17 DIAGNOSIS — R9431 Abnormal electrocardiogram [ECG] [EKG]: Secondary | ICD-10-CM | POA: Diagnosis not present

## 2021-03-17 DIAGNOSIS — R42 Dizziness and giddiness: Secondary | ICD-10-CM

## 2021-03-17 DIAGNOSIS — R002 Palpitations: Secondary | ICD-10-CM | POA: Insufficient documentation

## 2021-03-17 DIAGNOSIS — R000 Tachycardia, unspecified: Secondary | ICD-10-CM | POA: Diagnosis not present

## 2021-03-17 DIAGNOSIS — Z79899 Other long term (current) drug therapy: Secondary | ICD-10-CM | POA: Diagnosis not present

## 2021-03-17 DIAGNOSIS — R030 Elevated blood-pressure reading, without diagnosis of hypertension: Secondary | ICD-10-CM

## 2021-03-17 DIAGNOSIS — I10 Essential (primary) hypertension: Secondary | ICD-10-CM | POA: Diagnosis not present

## 2021-03-17 DIAGNOSIS — R519 Headache, unspecified: Secondary | ICD-10-CM

## 2021-03-17 DIAGNOSIS — R0602 Shortness of breath: Secondary | ICD-10-CM | POA: Diagnosis not present

## 2021-03-17 LAB — BASIC METABOLIC PANEL
Anion gap: 12 (ref 5–15)
BUN: 9 mg/dL (ref 8–23)
CO2: 24 mmol/L (ref 22–32)
Calcium: 9.7 mg/dL (ref 8.9–10.3)
Chloride: 105 mmol/L (ref 98–111)
Creatinine, Ser: 0.77 mg/dL (ref 0.44–1.00)
GFR, Estimated: >60 mL/min (ref >60)
Glucose, Bld: 87 mg/dL (ref 70–99)
Potassium: 3.5 mmol/L (ref 3.5–5.1)
Sodium: 141 mmol/L (ref 135–145)

## 2021-03-17 LAB — TSH: TSH: 1.359 u[IU]/mL (ref 0.350–4.500)

## 2021-03-17 LAB — CBC
HCT: 49.5 % — ABNORMAL HIGH (ref 36.0–46.0)
Hemoglobin: 15.1 g/dL — ABNORMAL HIGH (ref 12.0–15.0)
MCH: 24.8 pg — ABNORMAL LOW (ref 26.0–34.0)
MCHC: 30.5 g/dL (ref 30.0–36.0)
MCV: 81.1 fL (ref 80.0–100.0)
Platelets: 240 10*3/uL (ref 150–400)
RBC: 6.1 MIL/uL — ABNORMAL HIGH (ref 3.87–5.11)
RDW: 14.1 % (ref 11.5–15.5)
WBC: 5.2 10*3/uL (ref 4.0–10.5)
nRBC: 0 % (ref 0.0–0.2)

## 2021-03-17 LAB — TROPONIN I (HIGH SENSITIVITY): Troponin I (High Sensitivity): 2 ng/L (ref <18)

## 2021-03-17 LAB — MAGNESIUM: Magnesium: 2.2 mg/dL (ref 1.7–2.4)

## 2021-03-17 MED ORDER — METOPROLOL TARTRATE 25 MG PO TABS
12.5000 mg | ORAL_TABLET | Freq: Once | ORAL | Status: AC
  Administered 2021-03-17: 12.5 mg via ORAL
  Filled 2021-03-17: qty 1

## 2021-03-17 MED ORDER — METOPROLOL TARTRATE 25 MG PO TABS: 12.5000 mg | ORAL_TABLET | Freq: Two times a day (BID) | ORAL | 0 refills | Status: DC | PRN

## 2021-03-17 NOTE — Discharge Instructions (Addendum)
To the emergency department for evaluation of your abnormal EKG, tachycardia, heart palpitations, dizziness, and headache.

## 2021-03-17 NOTE — Telephone Encounter (Signed)
[facility] - Client TELEPHONE ADVICE RECORD [facility] Patient Name: Mindy Long Gender: Female DOB: [birth year] Age: 60 Y 9 M 26 D Return Phone Number: [phone] (Primary) Address: City/ State/ Zip: [place] [address] Client [facility] - Client Client Site [facility], Roque Lias - MD Contact Type Call Who Is Calling Patient / Member / Family / Caregiver Call Type Triage / Clinical Relationship To Patient Self Return Phone Number [phone] (Primary) Chief Complaint Heart palpitations or irregular heartbeat Reason for Call Symptomatic / Request for Portal states she has more frequent fluttering in her chest. She is not complaining of chest pains. No appts available today. Translation No Nurse Assessment Nurse: Altamease Oiler, RN, Adriana Date/Time (Eastern Time): 03/17/2021 9:12:59 AM Confirm and document reason for call. If symptomatic, describe symptoms. ---pt states that she mentioned to her PCP in her last checkup that she was experiencing fluttering to her chest, has been ongoing for last year intermittently. worse in the last week. states frequency q30-60 min. mild dizziness Does the patient have any new or worsening symptoms? ---Yes Will a triage be completed? ---Yes Related visit to physician within the last 2 weeks? ---No Does the PT have any chronic conditions? (i.e. diabetes, asthma, this includes High risk factors for pregnancy, etc.) ---Yes List chronic conditions. ---acid reflux htn Is this a behavioral health or substance abuse call? ---No Guidelines Guideline Title Affirmed Question Affirmed Notes Nurse Date/Time (Eastern Time) Heart Rate and Heartbeat Questions Palpitations are a chronic symptom (recurrent or ongoing AND present > 4 weeks) Altamease Oiler, RN, Adriana 03/17/2021 9:16:23 AM Disp. Time  Eilene Ghazi Time) Disposition Final User PLEASE NOTE: All timestamps contained within this report are represented as [place] Standard Time. CONFIDENTIALTY NOTICE: This fax transmission is intended only for the addressee. It contains information that is legally privileged, confidential or otherwise protected from use or disclosure. If you are not the intended recipient, you are strictly prohibited from reviewing, disclosing, copying using or disseminating any of this information or taking any action in reliance on or regarding this information. If you have received this fax in error, please notify us immediately by telephone so that we can arrange for its return to [place]. Phone: [phone], Toll-Free: [phone], Fax: [phone] Page: 2 of 2 Call Id: [id number] 03/17/2021 9:27:54 AM See PCP within 2 Weeks Yes Altamease Oiler, RN, Adriana Caller Disagree/Comply Comply Caller Understands Yes PreDisposition Call Doctor Care Advice Given Per Guideline SEE PCP WITHIN 2 WEEKS: * You need to be seen for this ongoing problem within the next 2 weeks. * PCP VISIT: Call your doctor (or NP/PA) during regular office hours and make an appointment. HEALTH BASICS: * Sleep: Try to get a sufficient amount of sleep. Lack of sleep can aggravate palpitations. Most people need 7 to 8 hours of sleep each night. * Exercise: Regular exercise will improve your overall health, improve your mood, and is a simple method to reduce stress. * Diet: Eat a balanced healthy diet. * Liquid Intake: Drink adequate liquids, 6 to 8 glasses of water daily. AVOID CAFFEINE: * Avoid caffeine-containing beverages (Reason: caffeine is a stimulant and can aggravate palpitations). * Examples include coffee, tea, colas, [address] [employer], and some 'energy drinks'. LIMIT ALCOHOL: * Limit your alcohol consumption to no more than 2 drinks a day. OTHER: * For smokers - Stop or reduce your smoking CALL BACK IF: * Chest pain, lightheadedness or difficulty  breathing occurs * Heart beating over 140 beats / minute * More than 3 extra or skipped beats / minute * You become worse CARE ADVICE given per Heart Rate and Heartbeat Questions (Adult) guideline. Referrals REFERRED TO PCP OFFICE

## 2021-03-17 NOTE — Telephone Encounter (Signed)
Aware, will watch for correspondence  

## 2021-03-17 NOTE — Discharge Instructions (Addendum)
Call Dr. Glori Bickers to discuss your ER visit. You should discuss getting set up with an outpatient heart monitor and/or echocardiogram in the near future.  For now, take the metoprolol twice a day, as needed for palpitations. I'd recommend taking it in the morning tomorrow then seeing if it helps your symptoms.  If Dr. Glori Bickers would prefer Cardiology referral for this, Dr. Saunders Revel is on call and number provided above.

## 2021-03-17 NOTE — ED Provider Notes (Signed)
[name] Emergency Department Provider Note  ____________________________________________   Event Date/Time   First MD Initiated Contact with Patient 03/17/21 1301     (approximate)  I have reviewed the triage vital signs and the nursing notes.   HISTORY  Chief Complaint Palpitations    HPI Mindy Long is a 60 y.o. female  With h/o palpitations here with palpitations.  Patient states that for the last week or 2, she has had increasingly frequent episodes in which she feels like her heart is beating very quickly.  She states that she has a history of palpitations and has been seen by her PCP for this, who told her to decrease her caffeine intake.  She has done this but has noticed increasing episodes over the last 2 weeks of palpitation.  She states that she feels like she is having additional heartbeats intermittently, and feels like her heart is beating quickly.  These episodes seem to come and go fairly quickly.  Denies specific alleviating or aggravating factors.  She does have about 1 cup of coffee per day but no significant increase in this recently.  No recent weight loss or other health changes.  No over-the-counter medication use.  Denies any heavy alcohol use.  Of note, she was told that she has a possible thyroid issue in the past, but has not had her thyroid checked.  No chest pain or shortness of breath.  She did have 1 or 2 episodes of lightheadedness with the palpitations but this resolved quickly.  No syncope.        Past Medical History:  Diagnosis Date  . Back pain   . Chronic back pain   . GERD (gastroesophageal reflux disease)   . Headache(784.0)   . History of nephrolithiasis   . Hypertension   . Plantar fasciitis   . Sleep apnea   . Ulcer 1980's   peptic ulcer disease    Patient Active Problem List   Diagnosis Date Noted  . Localized skin mass, lump, or swelling 10/28/2020  . Cyst of skin of breast 09/25/2019  .  Abnormal serum ACE level 04/08/2018  . Eye exam abnormal 08/04/2017  . Tinnitus 11/13/2014  . History of shingles 04/27/2014  . Low back pain 04/20/2014  . OSA (obstructive sleep apnea) 09/27/2012  . Snoring 06/21/2012  . Fatigue 06/20/2012  . Colon cancer screening 11/04/2011  . Routine general medical examination at a health care facility 10/22/2011  . Vitamin D deficiency 07/02/2010  . HYPOKALEMIA 07/10/2009  . Allergic rhinitis 04/03/2008  . Essential hypertension 01/25/2008  . G E R D 07/05/2007  . H/O peptic ulcer 07/05/2007  . Headache, chronic daily 07/05/2007  . NEPHROLITHIASIS, HX OF 07/05/2007    Past Surgical History:  Procedure Laterality Date  . ABDOMINAL HYSTERECTOMY  2002  . CHOLECYSTECTOMY  1984  . COLONOSCOPY  12/18/2011   Normal  . IR ANGIO EXTERNAL CAROTID SEL EXT CAROTID BILAT MOD SED  11/15/2018  . IR ANGIO INTRA EXTRACRAN SEL INTERNAL CAROTID BILAT MOD SED  11/15/2018  . IR ANGIO VERTEBRAL SEL VERTEBRAL UNI R MOD SED  11/15/2018  . IR US GUIDE VASC ACCESS RIGHT  11/15/2018  . OOPHORECTOMY      Prior to Admission medications   Medication Sig Start Date End Date Taking? Authorizing Provider  metoprolol tartrate (LOPRESSOR) 25 MG tablet Take 0.5 tablets (12.5 mg total) by mouth 2 (two) times daily as needed (palpitations). 03/17/21 03/17/22 Yes Duffy Bruce, MD  amLODipine (  NORVASC) 5 MG tablet TAKE 1 TABLET BY MOUTH EVERY DAY 02/18/21   Tower, Wynelle Fanny, MD  celecoxib (CELEBREX) 200 MG capsule TAKE 1 CAPSULE (200 MG TOTAL) BY MOUTH DAILY AS NEEDED FOR MODERATE PAIN. WITH FOOD 01/27/21   Tower, Wynelle Fanny, MD  Cholecalciferol (VITAMIN D3) 2000 UNITS capsule Take 2,000 Units by mouth daily.    [provider]  gabapentin (NEURONTIN) 300 MG capsule Take 300 mg by mouth 3 (three) times daily. Patient not taking: No sig reported    [provider]  hydrOXYzine (ATARAX/VISTARIL) 10 MG tablet Take 1 tablet (10 mg total) by mouth 3 (three) times daily  as needed. Patient not taking: No sig reported 03/16/18   Tower, Wynelle Fanny, MD  omeprazole (PRILOSEC) 20 MG capsule TAKE 1 CAPSULE (20 MG TOTAL) BY MOUTH DAILY AS NEEDED. 10/17/20   Tower, Wynelle Fanny, MD  ondansetron (ZOFRAN) 4 MG tablet Take 1 tablet (4 mg total) by mouth every 8 (eight) hours as needed. Patient not taking: No sig reported 11/01/17   Jearld Fenton, NP  predniSONE (DELTASONE) 50 MG tablet Take 1 tab daily with breakfast for 3 days 11/24/20   Volney American, PA-C  tiZANidine (ZANAFLEX) 4 MG tablet Take 1 tablet (4 mg total) by mouth 2 (two) times daily as needed. DO NOT DRINK ALCOHOL OR DRIVE WHILE TAKING THIS MEDICATION 11/24/20   Volney American, PA-C    Allergies Atenolol and Hydrocodone  Family History  Problem Relation Age of Onset  . Stroke Father   . Hypertension Father   . Diabetes Father   . Cancer Brother        stomach tumor    Social History Social History   Tobacco Use  . Smoking status: Never Smoker  . Smokeless tobacco: Never Used  Vaping Use  . Vaping Use: Every day  Substance Use Topics  . Alcohol use: No  . Drug use: No    Review of Systems  Review of Systems  Constitutional: Positive for fatigue. Negative for fever.  HENT: Negative for congestion and sore throat.   Eyes: Negative for visual disturbance.  Respiratory: Positive for shortness of breath. Negative for cough.   Cardiovascular: Positive for palpitations. Negative for chest pain.  Gastrointestinal: Negative for abdominal pain, diarrhea, nausea and vomiting.  Genitourinary: Negative for flank pain.  Musculoskeletal: Negative for back pain and neck pain.  Skin: Negative for rash and wound.  Neurological: Positive for weakness.  All other systems reviewed and are negative.    ____________________________________________  PHYSICAL EXAM:      VITAL SIGNS: ED Triage Vitals [03/17/21 1258]  Enc Vitals Group     BP      Pulse      Resp      Temp      Temp src       SpO2      Weight 167 lb (75.8 kg)     Height 5\' 3"  (1.6 m)     Head Circumference      Peak Flow      Pain Score 0     Pain Loc      Pain Edu?      Excl. in [place]?      Physical Exam Vitals and nursing note reviewed.  Constitutional:      General: She is not in acute distress.    Appearance: She is well-developed.  HENT:     Head: Normocephalic and atraumatic.  Eyes:  Conjunctiva/sclera: Conjunctivae normal.  Cardiovascular:     Rate and Rhythm: Regular rhythm. Tachycardia present.     Heart sounds: Normal heart sounds. No murmur heard. No friction rub.  Pulmonary:     Effort: Pulmonary effort is normal. No respiratory distress.     Breath sounds: Normal breath sounds. No wheezing or rales.  Abdominal:     General: There is no distension.     Palpations: Abdomen is soft.     Tenderness: There is no abdominal tenderness.  Musculoskeletal:     Cervical back: Neck supple.  Skin:    General: Skin is warm.     Capillary Refill: Capillary refill takes less than 2 seconds.  Neurological:     Mental Status: She is alert and oriented to person, place, and time.     Motor: No abnormal muscle tone.       ____________________________________________   LABS (all labs ordered are listed, but only abnormal results are displayed)  Labs Reviewed  CBC - Abnormal; Notable for the following components:      Result Value   RBC 6.10 (*)    Hemoglobin 15.1 (*)    HCT 49.5 (*)    MCH 24.8 (*)    All other components within normal limits  BASIC METABOLIC PANEL  MAGNESIUM  TSH  TROPONIN I (HIGH SENSITIVITY)    ____________________________________________  EKG: Sinus tachycardia, ventricular rate 113.  PR 172, QRS 66, QTc 438.  Nonspecific ST changes.  No acute ST elevations or depression.  No EKG evidence of acute ischemia or infarct. ________________________________________  RADIOLOGY All imaging, including plain films, CT scans, and ultrasounds, independently  reviewed by me, and interpretations confirmed via formal radiology reads.  ED MD interpretation:   Chest x-ray: Clear  Official radiology report(s): DG Chest 2 View  Result Date: 03/17/2021 CLINICAL DATA:  Shortness of breath. EXAM: CHEST - 2 VIEW COMPARISON:  None. FINDINGS: The heart size and mediastinal contours are within normal limits. Both lungs are clear. No pneumothorax or pleural effusion is noted. The visualized skeletal structures are unremarkable. IMPRESSION: No active cardiopulmonary disease. Electronically Signed   By: Marijo Conception M.D.   On: 03/17/2021 13:59    ____________________________________________  PROCEDURES   Procedure(s) performed (including Critical Care):  Procedures  ____________________________________________  INITIAL IMPRESSION / MDM / [place] / ED COURSE  As part of my medical decision making, I reviewed the following data within the [place] notes reviewed and incorporated, Old chart reviewed, Notes from prior ED visits, and [place] Controlled Substance Database       *Rafeef Lau Bartram was evaluated in Emergency Department on 03/17/2021 for the symptoms described in the history of present illness. She was evaluated in the context of the global COVID-19 pandemic, which necessitated consideration that the patient might be at risk for infection with the SARS-CoV-2 virus that causes COVID-19. Institutional protocols and algorithms that pertain to the evaluation of patients at risk for COVID-19 are in a state of rapid change based on information released by regulatory bodies including the CDC and federal and state organizations. These policies and algorithms were followed during the patient's care in the ED.  Some ED evaluations and interventions may be delayed as a result of limited staffing during the pandemic.*     Medical Decision Making: Very pleasant 60 year old female here with mild palpitations.  Symptoms seem  consistent with possible symptomatic PVCs versus transient SVT or other benign arrhythmia.  She has  no evidence of ectopy or ventricular arrhythmia on telemetry here in the ED.  Denies any chest pain and she has no known history of ischemic heart disease.  Denies any heavy caffeine or alcohol use.  Screening lab work reviewed as above, is largely unremarkable.  No significant anemia.  No electrolyte abnormalities.  Thyroid studies are normal.  Given the degree of her symptoms, she is requesting something to treat her palpitations and she does have some intermittent sinus tachycardia here.  No evidence to suggest DVT, PE, or other etiologies for her tachycardia.  After discussing risks and benefits of medications, will start on a low-dose of Lopressor to see if this helps that she also has hypertension.  Will have her call her PCP to set up an outpatient Holter monitor and possible echocardiogram.  Return precautions given.  ____________________________________________  FINAL CLINICAL IMPRESSION(S) / ED DIAGNOSES  Final diagnoses:  Heart palpitations     MEDICATIONS GIVEN DURING THIS VISIT:  Medications  metoprolol tartrate (LOPRESSOR) tablet 12.5 mg (12.5 mg Oral Given 03/17/21 1612)     ED Discharge Orders         Ordered    metoprolol tartrate (LOPRESSOR) 25 MG tablet  2 times daily PRN        03/17/21 1635           Note:  This document was prepared using Dragon voice recognition software and may include unintentional dictation errors.   Duffy Bruce, MD 03/17/21 [phone]

## 2021-03-17 NOTE — ED Provider Notes (Signed)
[facility]    CSN: [id number] Arrival date & time: 03/17/21  1145      History   Chief Complaint Chief Complaint  Patient presents with  . Irregular Heart Beat    HPI Mindy Long is a 60 y.o. female.   Patient presents with 1.5-week history of "heart fluttering."  She states he has had heart fluttering for more than a year intermittently.  She was previously instructed to discontinue caffeine and her symptoms improved.  Her symptoms returned after she restarted caffeine.  Patient also reports headache and dizziness.  Treatment attempted at home with Tylenol.  She denies chest pain, shortness of breath, focal weakness, numbness, or other symptoms.  Her medical history includes hypertension, chronic back pain, headache, peptic ulcer disease, GERD, kidney stones.  The history is provided by the patient and medical records.    Past Medical History:  Diagnosis Date  . Back pain   . Chronic back pain   . GERD (gastroesophageal reflux disease)   . Headache(784.0)   . History of nephrolithiasis   . Hypertension   . Plantar fasciitis   . Sleep apnea   . Ulcer 1980's   peptic ulcer disease    Patient Active Problem List   Diagnosis Date Noted  . Localized skin mass, lump, or swelling 10/28/2020  . Cyst of skin of breast 09/25/2019  . Abnormal serum ACE level 04/08/2018  . Eye exam abnormal 08/04/2017  . Tinnitus 11/13/2014  . History of shingles 04/27/2014  . Low back pain 04/20/2014  . OSA (obstructive sleep apnea) 09/27/2012  . Snoring 06/21/2012  . Fatigue 06/20/2012  . Colon cancer screening 11/04/2011  . Routine general medical examination at a health care facility 10/22/2011  . Vitamin D deficiency 07/02/2010  . HYPOKALEMIA 07/10/2009  . Allergic rhinitis 04/03/2008  . Essential hypertension 01/25/2008  . G E R D 07/05/2007  . H/O peptic ulcer 07/05/2007  . Headache, chronic daily 07/05/2007  . NEPHROLITHIASIS, HX OF 07/05/2007    Past Surgical  History:  Procedure Laterality Date  . ABDOMINAL HYSTERECTOMY  2002  . CHOLECYSTECTOMY  1984  . COLONOSCOPY  12/18/2011   Normal  . IR ANGIO EXTERNAL CAROTID SEL EXT CAROTID BILAT MOD SED  11/15/2018  . IR ANGIO INTRA EXTRACRAN SEL INTERNAL CAROTID BILAT MOD SED  11/15/2018  . IR ANGIO VERTEBRAL SEL VERTEBRAL UNI R MOD SED  11/15/2018  . IR US GUIDE VASC ACCESS RIGHT  11/15/2018  . OOPHORECTOMY      OB History    Gravida  3   Para  3   Term  3   Preterm      AB      Living  3     SAB      IAB      Ectopic      Multiple      Live Births               Home Medications    Prior to Admission medications   Medication Sig Start Date End Date Taking? Authorizing Provider  amLODipine (NORVASC) 5 MG tablet TAKE 1 TABLET BY MOUTH EVERY DAY 02/18/21   Tower, Wynelle Fanny, MD  celecoxib (CELEBREX) 200 MG capsule TAKE 1 CAPSULE (200 MG TOTAL) BY MOUTH DAILY AS NEEDED FOR MODERATE PAIN. WITH FOOD 01/27/21   Tower, Wynelle Fanny, MD  Cholecalciferol (VITAMIN D3) 2000 UNITS capsule Take 2,000 Units by mouth daily.    [provider]  gabapentin (NEURONTIN) 300 MG capsule Take 300 mg by mouth 3 (three) times daily. Patient not taking: No sig reported    [provider]  hydrOXYzine (ATARAX/VISTARIL) 10 MG tablet Take 1 tablet (10 mg total) by mouth 3 (three) times daily as needed. Patient not taking: No sig reported 03/16/18   Tower, Wynelle Fanny, MD  omeprazole (PRILOSEC) 20 MG capsule TAKE 1 CAPSULE (20 MG TOTAL) BY MOUTH DAILY AS NEEDED. 10/17/20   Tower, Wynelle Fanny, MD  ondansetron (ZOFRAN) 4 MG tablet Take 1 tablet (4 mg total) by mouth every 8 (eight) hours as needed. Patient not taking: No sig reported 11/01/17   Jearld Fenton, NP  predniSONE (DELTASONE) 50 MG tablet Take 1 tab daily with breakfast for 3 days 11/24/20   Volney American, PA-C  tiZANidine (ZANAFLEX) 4 MG tablet Take 1 tablet (4 mg total) by mouth 2 (two) times daily as needed. DO NOT DRINK ALCOHOL OR  DRIVE WHILE TAKING THIS MEDICATION 11/24/20   Volney American, PA-C    Family History Family History  Problem Relation Age of Onset  . Stroke Father   . Hypertension Father   . Diabetes Father   . Cancer Brother        stomach tumor    Social History Social History   Tobacco Use  . Smoking status: Never Smoker  . Smokeless tobacco: Never Used  Vaping Use  . Vaping Use: Every day  Substance Use Topics  . Alcohol use: No  . Drug use: No     Allergies   Atenolol and Hydrocodone   Review of Systems Review of Systems  Constitutional: Negative for chills and fever.  HENT: Negative for ear pain and sore throat.   Eyes: Negative for pain and visual disturbance.  Respiratory: Negative for cough and shortness of breath.   Cardiovascular: Positive for palpitations. Negative for chest pain and leg swelling.  Gastrointestinal: Negative for abdominal pain, nausea and vomiting.  Genitourinary: Negative for dysuria and hematuria.  Musculoskeletal: Negative for arthralgias and back pain.  Skin: Negative for color change and rash.  Neurological: Positive for dizziness and headaches. Negative for seizures, syncope, weakness and numbness.  All other systems reviewed and are negative.    Physical Exam Triage Vital Signs ED Triage Vitals  Enc Vitals Group     BP      Pulse      Resp      Temp      Temp src      SpO2      Weight      Height      Head Circumference      Peak Flow      Pain Score      Pain Loc      Pain Edu?      Excl. in [place]?    No data found.  Updated Vital Signs BP (!) 146/106 (BP Location: Left Arm)   Pulse 98   Temp 98.5 F (36.9 C) (Oral)   Resp 18   Wt 167 lb (75.8 kg)   SpO2 95%   BMI 30.06 kg/m   Visual Acuity Right Eye Distance:   Left Eye Distance:   Bilateral Distance:    Right Eye Near:   Left Eye Near:    Bilateral Near:     Physical Exam Vitals and nursing note reviewed.  Constitutional:      General: She is not  in acute distress.    Appearance: She  is well-developed.  HENT:     Head: Normocephalic and atraumatic.     Mouth/Throat:     Mouth: Mucous membranes are moist.  Eyes:     Conjunctiva/sclera: Conjunctivae normal.  Cardiovascular:     Rate and Rhythm: Regular rhythm. Tachycardia present.     Heart sounds: Normal heart sounds.  Pulmonary:     Effort: Pulmonary effort is normal. No respiratory distress.     Breath sounds: Normal breath sounds.  Abdominal:     Palpations: Abdomen is soft.     Tenderness: There is no abdominal tenderness.  Musculoskeletal:     Cervical back: Neck supple.     Right lower leg: No edema.     Left lower leg: No edema.  Skin:    General: Skin is warm and dry.  Neurological:     General: No focal deficit present.     Mental Status: She is alert and oriented to person, place, and time.     Sensory: No sensory deficit.     Motor: No weakness.     Gait: Gait normal.  Psychiatric:        Mood and Affect: Mood normal.        Behavior: Behavior normal.      UC Treatments / Results  Labs (all labs ordered are listed, but only abnormal results are displayed) Labs Reviewed - No data to display  EKG   Radiology No results found.  Procedures Procedures (including critical care time)  Medications Ordered in UC Medications - No data to display  Initial Impression / Assessment and Plan / UC Course  I have reviewed the triage vital signs and the nursing notes.  Pertinent labs & imaging results that were available during my care of the patient were reviewed by me and considered in my medical decision making (see chart for details).   Abnormal EKG, tachycardia, heart palpitations, dizziness, headache, elevated blood pressure reading.  EKG shows sinus tachycardia, rate 109, T wave abnormality in leads III, aVR, aVF, V3, V4, V5; changed compared to previous from 2011.  Sending patient to the ED for evaluation.  She declines EMS and states her husband will  drive her.  She feels stable for transport by her husband.   Final Clinical Impressions(s) / UC Diagnoses   Final diagnoses:  Abnormal EKG  Tachycardia  Heart palpitations  Dizziness  Acute nonintractable headache, unspecified headache type  Elevated blood pressure reading     Discharge Instructions     To the emergency department for evaluation of your abnormal EKG, tachycardia, heart palpitations, dizziness, and headache.        ED Prescriptions    None     PDMP not reviewed this encounter.   Sharion Balloon, NP 03/17/21 1227

## 2021-03-17 NOTE — Telephone Encounter (Addendum)
I spoke with pt; pt has had fluttering in chest on and off for 1 year but the past week fluttering has worsened.pt having fluttering for at least 30'- 60' every day this week.No CP,chest pressure or chest tightness. Pt said usually has fluttering when exerting herself. BP today was 140/84 P 111. Last fluttering of heart was within the last hour. Pt has a slight H/A and dizziness. Room is not spinning around so more lightheaded. Pt is cancelling appt with DR Damita Dunnings for 03/20/21 and pt is going to [facility] in [place] for eval and testing. Sending note to DR [employer] as Juluis Rainier.

## 2021-03-17 NOTE — ED Triage Notes (Signed)
Pt comes with c/o palpitations and SOB for few days on and off. Pt states hx of this and was informed to dec caffeine intake.  Pt denies any CP,

## 2021-03-17 NOTE — ED Triage Notes (Signed)
Pt presents to UC for increased heart fluttering episodes for the past 1.5 weeks. She states this has been an on-going issue that her PCP is aware of but most recently has increased. She spoke to the triage RN who instructed her to come in for eval. She also c/o of dizziness and headache as well. She reports treating her headache with Tylenol.   Denies SOB.

## 2021-03-17 NOTE — ED Notes (Signed)
Patient is being discharged from the Urgent Care and sent to the Emergency Department via POV . Per Claiborne Billings, NP patient is in need of higher level of care due to abnormal EKG. Patient is aware and verbalizes understanding of plan of care.  Vitals:   03/17/21 1154  BP: (!) 146/106  Pulse: 98  Resp: 18  Temp: 98.5 F (36.9 C)  SpO2: 95%

## 2021-03-18 ENCOUNTER — Telehealth: Payer: Self-pay | Admitting: Family Medicine

## 2021-03-18 NOTE — Telephone Encounter (Signed)
Pt notified of Dr. Marliss Coots comments and f/u appt scheduled for 04/01/21 (1st available)

## 2021-03-18 NOTE — Telephone Encounter (Signed)
Yes- please take that with current medicine  I would like her to follow up with me before referring to cardiology (week after next is ok then we will see how she does on the new medicine)  If problems before then however please let us know

## 2021-03-18 NOTE — Telephone Encounter (Signed)
Mindy Long went to the ER last night and they did an EKG and referred her to do an outpatient moniter, and they prescribed Metoprolol 2x a day and she wanted to know if she can take it with her BP medication. And wanted to know if she needed to get referred to a cardiologist.   Please advise

## 2021-03-20 ENCOUNTER — Ambulatory Visit: Payer: BC Managed Care – PPO | Admitting: Family Medicine

## 2021-03-26 DIAGNOSIS — M5116 Intervertebral disc disorders with radiculopathy, lumbar region: Secondary | ICD-10-CM | POA: Diagnosis not present

## 2021-04-01 ENCOUNTER — Encounter: Payer: Self-pay | Admitting: Family Medicine

## 2021-04-01 ENCOUNTER — Ambulatory Visit (INDEPENDENT_AMBULATORY_CARE_PROVIDER_SITE_OTHER): Payer: BC Managed Care – PPO | Admitting: Family Medicine

## 2021-04-01 ENCOUNTER — Other Ambulatory Visit: Payer: Self-pay

## 2021-04-01 VITALS — BP 134/80 | HR 69 | Temp 97.2°F | Ht 63.0 in | Wt 167.0 lb

## 2021-04-01 DIAGNOSIS — R0789 Other chest pain: Secondary | ICD-10-CM | POA: Insufficient documentation

## 2021-04-01 DIAGNOSIS — I10 Essential (primary) hypertension: Secondary | ICD-10-CM | POA: Diagnosis not present

## 2021-04-01 DIAGNOSIS — K219 Gastro-esophageal reflux disease without esophagitis: Secondary | ICD-10-CM | POA: Diagnosis not present

## 2021-04-01 DIAGNOSIS — R002 Palpitations: Secondary | ICD-10-CM

## 2021-04-01 DIAGNOSIS — R079 Chest pain, unspecified: Secondary | ICD-10-CM | POA: Diagnosis not present

## 2021-04-01 MED ORDER — METOPROLOL TARTRATE 25 MG PO TABS
12.5000 mg | ORAL_TABLET | Freq: Two times a day (BID) | ORAL | 3 refills | Status: DC | PRN
Start: 2021-04-01 — End: 2021-04-04

## 2021-04-01 NOTE — Progress Notes (Signed)
Subjective:    Patient ID: Mindy Long, female    DOB: 1961/01/23, 60 y.o.   MRN: [id number]  This visit occurred during the SARS-CoV-2 public health emergency.  Safety protocols were in place, including screening questions prior to the visit, additional usage of staff PPE, and extensive cleaning of exam room while observing appropriate contact time as indicated for disinfecting solutions.    HPI Pt presents for f/u from ER visit   Wt Readings from Last 3 Encounters:  04/01/21 167 lb (75.8 kg)  03/17/21 167 lb (75.8 kg)  03/17/21 167 lb (75.8 kg)   29.58 kg/m   She was seen at [facility] on 4/11 for palpitations She presented with c/o increasing palpitations for the 2 wk prior despite cutting caffeine   EKG showed sinus tachycardia with rate of 113 and no acute changes (no evidence of ectopy)  cxr clear  Labs Results for orders placed or performed during the hospital encounter of 78/93/81  Basic metabolic panel  Result Value Ref Range   Sodium 141 135 - 145 mmol/L   Potassium 3.5 3.5 - 5.1 mmol/L   Chloride 105 98 - 111 mmol/L   CO2 24 22 - 32 mmol/L   Glucose, Bld 87 70 - 99 mg/dL   BUN 9 8 - 23 mg/dL   Creatinine, Ser 0.77 0.44 - 1.00 mg/dL   Calcium 9.7 8.9 - 10.3 mg/dL   GFR, Estimated >60 >60 mL/min   Anion gap 12 5 - 15  CBC  Result Value Ref Range   WBC 5.2 4.0 - 10.5 K/uL   RBC 6.10 (H) 3.87 - 5.11 MIL/uL   Hemoglobin 15.1 (H) 12.0 - 15.0 g/dL   HCT 49.5 (H) 36.0 - 46.0 %   MCV 81.1 80.0 - 100.0 fL   MCH 24.8 (L) 26.0 - 34.0 pg   MCHC 30.5 30.0 - 36.0 g/dL   RDW 14.1 11.5 - 15.5 %   Platelets 240 150 - 400 K/uL   nRBC 0.0 0.0 - 0.2 %  Magnesium  Result Value Ref Range   Magnesium 2.2 1.7 - 2.4 mg/dL  TSH  Result Value Ref Range   TSH 1.359 0.350 - 4.500 uIU/mL  Troponin I (High Sensitivity)  Result Value Ref Range   Troponin I (High Sensitivity) 2 <18 ng/L    She was treated with low dose lopressor to see if helpful with symptoms    BP  Readings from Last 3 Encounters:  04/01/21 134/80  03/17/21 (!) 147/96  03/17/21 (!) 146/106   Pulse Readings from Last 3 Encounters:  04/01/21 69  03/17/21 89  03/17/21 98   Metoprolol 12.5 mg bid  Amlodipine 5 mg daily  Does not feel palpitations sitting here today  Had some in the car   Started having some chest discomfort after leaving ER   (last week)  Happened while eating (had to stop eating) out of the blue  Was sharp L sided pain and then felt like pins Some headaches also -for about 3 days   Not exertional at all   Feels the most palpitations when lying on back or L side   Stress level has been baseline  Cares for grandson   Stopped caffeine entirely  occ decaf 2-3 times per week  Needs to drink more water- working on it   GERD - takes ppi once to twice daily   Patient Active Problem List   Diagnosis Date Noted  . Chest pain 04/01/2021  .  Localized skin mass, lump, or swelling 10/28/2020  . Cyst of skin of breast 09/25/2019  . Abnormal serum ACE level 04/08/2018  . Eye exam abnormal 08/04/2017  . Tinnitus 11/13/2014  . History of shingles 04/27/2014  . Low back pain 04/20/2014  . OSA (obstructive sleep apnea) 09/27/2012  . Snoring 06/21/2012  . Fatigue 06/20/2012  . Colon cancer screening 11/04/2011  . Routine general medical examination at a health care facility 10/22/2011  . Vitamin D deficiency 07/02/2010  . Palpitations 04/07/2010  . HYPOKALEMIA 07/10/2009  . Allergic rhinitis 04/03/2008  . Essential hypertension 01/25/2008  . G E R D 07/05/2007  . H/O peptic ulcer 07/05/2007  . Headache, chronic daily 07/05/2007  . NEPHROLITHIASIS, HX OF 07/05/2007   Past Medical History:  Diagnosis Date  . Back pain   . Chronic back pain   . GERD (gastroesophageal reflux disease)   . Headache(784.0)   . History of nephrolithiasis   . Hypertension   . Plantar fasciitis   . Sleep apnea   . Ulcer 1980's   peptic ulcer disease   Past Surgical  History:  Procedure Laterality Date  . ABDOMINAL HYSTERECTOMY  2002  . CHOLECYSTECTOMY  1984  . COLONOSCOPY  12/18/2011   Normal  . IR ANGIO EXTERNAL CAROTID SEL EXT CAROTID BILAT MOD SED  11/15/2018  . IR ANGIO INTRA EXTRACRAN SEL INTERNAL CAROTID BILAT MOD SED  11/15/2018  . IR ANGIO VERTEBRAL SEL VERTEBRAL UNI R MOD SED  11/15/2018  . IR US GUIDE VASC ACCESS RIGHT  11/15/2018  . OOPHORECTOMY     Social History   Tobacco Use  . Smoking status: Never Smoker  . Smokeless tobacco: Never Used  Vaping Use  . Vaping Use: Every day  Substance Use Topics  . Alcohol use: No  . Drug use: No   Family History  Problem Relation Age of Onset  . Stroke Father   . Hypertension Father   . Diabetes Father   . Cancer Brother        stomach tumor   Allergies  Allergen Reactions  . Atenolol Cough  . Hydrocodone Nausea Only   Current Outpatient Medications on File Prior to Visit  Medication Sig Dispense Refill  . amLODipine (NORVASC) 5 MG tablet TAKE 1 TABLET BY MOUTH EVERY DAY 90 tablet 1  . celecoxib (CELEBREX) 200 MG capsule TAKE 1 CAPSULE (200 MG TOTAL) BY MOUTH DAILY AS NEEDED FOR MODERATE PAIN. WITH FOOD 30 capsule 5  . Cholecalciferol (VITAMIN D3) 2000 UNITS capsule Take 2,000 Units by mouth daily.    Marland Kitchen gabapentin (NEURONTIN) 300 MG capsule Take 300 mg by mouth 3 (three) times daily.    . hydrOXYzine (ATARAX/VISTARIL) 10 MG tablet Take 1 tablet (10 mg total) by mouth 3 (three) times daily as needed. 30 tablet 0  . omeprazole (PRILOSEC) 20 MG capsule TAKE 1 CAPSULE (20 MG TOTAL) BY MOUTH DAILY AS NEEDED. 270 capsule 1  . ondansetron (ZOFRAN) 4 MG tablet Take 1 tablet (4 mg total) by mouth every 8 (eight) hours as needed. 20 tablet 0  . tiZANidine (ZANAFLEX) 4 MG tablet Take 1 tablet (4 mg total) by mouth 2 (two) times daily as needed. DO NOT DRINK ALCOHOL OR DRIVE WHILE TAKING THIS MEDICATION 20 tablet 0   No current facility-administered medications on file prior to visit.     Review of Systems  Constitutional: Positive for fatigue. Negative for activity change, appetite change, fever and unexpected weight change.  HENT: Negative for congestion,  ear pain, rhinorrhea, sinus pressure and sore throat.   Eyes: Negative for pain, redness and visual disturbance.  Respiratory: Negative for cough, shortness of breath and wheezing.   Cardiovascular: Positive for palpitations. Negative for chest pain and leg swelling.  Gastrointestinal: Negative for abdominal pain, blood in stool, constipation and diarrhea.  Endocrine: Negative for polydipsia and polyuria.  Genitourinary: Negative for dysuria, frequency and urgency.  Musculoskeletal: Negative for arthralgias, back pain and myalgias.  Skin: Negative for pallor and rash.  Allergic/Immunologic: Negative for environmental allergies.  Neurological: Negative for dizziness, syncope and headaches.  Hematological: Negative for adenopathy. Does not bruise/bleed easily.  Psychiatric/Behavioral: Negative for decreased concentration and dysphoric mood. The patient is not nervous/anxious.        Objective:   Physical Exam Constitutional:      General: She is not in acute distress.    Appearance: Normal appearance. She is well-developed. She is obese. She is not ill-appearing or diaphoretic.  HENT:     Head: Normocephalic and atraumatic.  Eyes:     Conjunctiva/sclera: Conjunctivae normal.     Pupils: Pupils are equal, round, and reactive to light.  Neck:     Thyroid: No thyromegaly.     Vascular: No carotid bruit or JVD.  Cardiovascular:     Rate and Rhythm: Normal rate and regular rhythm.     Heart sounds: Normal heart sounds. No gallop.   Pulmonary:     Effort: Pulmonary effort is normal. No respiratory distress.     Breath sounds: Normal breath sounds. No wheezing or rales.  Abdominal:     General: Bowel sounds are normal. There is no distension or abdominal bruit.     Palpations: Abdomen is soft. There is no mass.      Tenderness: There is no abdominal tenderness.  Musculoskeletal:     Cervical back: Normal range of motion and neck supple.     Right lower leg: No edema.     Left lower leg: No edema.     Comments: No leg tenderness  Lymphadenopathy:     Cervical: No cervical adenopathy.  Skin:    General: Skin is warm and dry.     Findings: No rash.  Neurological:     Mental Status: She is alert.     Motor: No weakness.     Coordination: Coordination normal.     Deep Tendon Reflexes: Reflexes are normal and symmetric.     Comments: No tremor   Psychiatric:        Mood and Affect: Mood normal.           Assessment & Plan:   Problem List Items Addressed This Visit      Cardiovascular and Mediastinum   Essential hypertension    bp in fair control at this time  BP Readings from Last 1 Encounters:  04/01/21 134/80   No changes needed Most recent labs reviewed  Disc lifstyle change with low sodium diet and exercise  Plan to continue metoprolol 12.5 mg bid and amlodipine 5 mg daily      Relevant Medications   metoprolol tartrate (LOPRESSOR) 25 MG tablet     Digestive   G E R D    Worse lately  May add to chest pain  Adv watch diet and inc nexium to bid        Other   Palpitations - Primary    Ongoing and recently worse with visit to ER Reviewed hospital records, lab results and studies in  detail   Reassuring w/u incl EKG and labs  Now improved with metoprolol (has had chest pain episode however) Strongly suspect PVC (less likely a fib) Ref made to cardiology for eval and most likely plan for monitor      Relevant Orders   Ambulatory referral to Cardiology   Chest pain    Episode of this in setting of palpitations  Not exertional  Reassuring ER w/u and exam today  Ref made to cardiology      Relevant Orders   Ambulatory referral to Cardiology

## 2021-04-01 NOTE — Assessment & Plan Note (Signed)
Episode of this in setting of palpitations  Not exertional  Reassuring ER w/u and exam today  Ref made to cardiology

## 2021-04-01 NOTE — Assessment & Plan Note (Signed)
bp in fair control at this time  BP Readings from Last 1 Encounters:  04/01/21 134/80   No changes needed Most recent labs reviewed  Disc lifstyle change with low sodium diet and exercise  Plan to continue metoprolol 12.5 mg bid and amlodipine 5 mg daily

## 2021-04-01 NOTE — Patient Instructions (Addendum)
I placed a referral for cardiology  You will get a call to schedule that   Continue to avoid caffeine   Increase your acid reflux medicine to twice daily for now   Continue the metoprolol

## 2021-04-01 NOTE — Assessment & Plan Note (Signed)
Ongoing and recently worse with visit to ER Reviewed hospital records, lab results and studies in detail   Reassuring w/u incl EKG and labs  Now improved with metoprolol (has had chest pain episode however) Strongly suspect PVC (less likely a fib) Ref made to cardiology for eval and most likely plan for monitor

## 2021-04-01 NOTE — Assessment & Plan Note (Signed)
Worse lately  May add to chest pain  Adv watch diet and inc nexium to bid

## 2021-04-04 ENCOUNTER — Ambulatory Visit (INDEPENDENT_AMBULATORY_CARE_PROVIDER_SITE_OTHER): Payer: BC Managed Care – PPO | Admitting: Internal Medicine

## 2021-04-04 ENCOUNTER — Ambulatory Visit (INDEPENDENT_AMBULATORY_CARE_PROVIDER_SITE_OTHER): Payer: BC Managed Care – PPO

## 2021-04-04 ENCOUNTER — Encounter: Payer: Self-pay | Admitting: Internal Medicine

## 2021-04-04 ENCOUNTER — Other Ambulatory Visit: Payer: Self-pay

## 2021-04-04 VITALS — BP 110/78 | HR 86 | Ht 63.0 in | Wt 167.0 lb

## 2021-04-04 DIAGNOSIS — R9431 Abnormal electrocardiogram [ECG] [EKG]: Secondary | ICD-10-CM

## 2021-04-04 DIAGNOSIS — R079 Chest pain, unspecified: Secondary | ICD-10-CM | POA: Diagnosis not present

## 2021-04-04 DIAGNOSIS — R002 Palpitations: Secondary | ICD-10-CM

## 2021-04-04 NOTE — Patient Instructions (Signed)
Medication Instructions:  Your physician recommends that you continue on your current medications as directed. Please refer to the Current Medication list given to you today.  *If you need a refill on your cardiac medications before your next appointment, please call your pharmacy*   Lab Work: None ordered If you have labs (blood work) drawn today and your tests are completely normal, you will receive your results only by: Marland Kitchen MyChart Message (if you have MyChart) OR . A paper copy in the mail If you have any lab test that is abnormal or we need to change your treatment, we will call you to review the results.   Testing/Procedures: Your physician has requested that you have an echocardiogram. Echocardiography is a painless test that uses sound waves to create images of your heart. It provides your doctor with information about the size and shape of your heart and how well your heart's chambers and valves are working. This procedure takes approximately one hour. There are no restrictions for this procedure.  Your physician has recommended that you wear a Zio XT monitor. (To be worn for 14 days)  This monitor is a medical device that records the heart's electrical activity. Doctors most often use these monitors to diagnose arrhythmias. Arrhythmias are problems with the speed or rhythm of the heartbeat. The monitor is a small device applied to your chest. You can wear one while you do your normal daily activities. While wearing this monitor if you have any symptoms to push the button and record what you felt. Once you have worn this monitor for the period of time provider prescribed (Usually 14 days), you will return the monitor device in the postage paid box. Once it is returned they will download the data collected and provide [place] with a report which the provider will then review and we will call you with those results. Important tips:  1. Avoid showering during the first 24 hours of wearing the  monitor. 2. Avoid excessive sweating to help maximize wear time. 3. Do not submerge the device, no hot tubs, and no swimming pools. 4. Keep any lotions or oils away from the patch. 5. After 24 hours you may shower with the patch on. Take brief showers with your back facing the shower head.  6. Do not remove patch once it has been placed because that will interrupt data and decrease adhesive wear time. 7. Push the button when you have any symptoms and write down what you were feeling. 8. Once you have completed wearing your monitor, remove and place into box which has postage paid and place in your outgoing mailbox.  9. If for some reason you have misplaced your box then call our office and we can provide another box and/or mail it off for you.       Follow-Up: At [facility] you and your health needs are our priority.  As part of our continuing mission to provide you with exceptional heart care, we have created designated Provider Care Teams.  These Care Teams include your primary Cardiologist (physician) and Advanced Practice Providers (APPs -  Physician Assistants and Nurse Practitioners) who all work together to provide you with the care you need, when you need it.  We recommend signing up for the patient portal called "MyChart".  Sign up information is provided on this After Visit Summary.  MyChart is used to connect with patients for Virtual Visits (Telemedicine).  Patients are able to view lab/test results, encounter notes, upcoming appointments, etc.  Non-urgent messages can be sent to your provider as well.   To learn more about what you can do with MyChart, go to [email or web address].    Your next appointment:   6 week(s)  The format for your next appointment:   In Person  Provider:   You may see Nelva Bush, MD or one of the following Advanced Practice Providers on your designated Care Team:    Murray Hodgkins, NP  Christell Faith, PA-C  Marrianne Mood,  PA-C  Cadence [place], [place]  Laurann Montana, NP    Other Instructions N/A

## 2021-04-04 NOTE — Progress Notes (Signed)
New Outpatient Visit Date: 04/04/2021  Referring Provider: Abner Greenspan, MD [place],  [place] [address]  Chief Complaint: Palpitations and chest pain  HPI:  Mindy Long is a 60 y.o. female who is being seen today for the evaluation of palpitations and chest pain at the request of Dr. Glori Bickers. She has a history of hypertension, sleep apnea, GERD, and kidney stones.  She presented to the [facility] emergency department earlier this month (03/17/2021) complaining of palpitations she had presented to urgent care earlier that day but was referred to the ED due to EKG abnormalities.  ED work-up was unrevealing.  She was discharged with a prescription for metoprolol tartrate 12.5 mg twice daily as needed for palpitations.  [name] notes that she has been experiencing palpitations for about a year.  Initially, they were fairly infrequent and improved with avoidance of caffeine.  However, over the last 3 weeks, the palpitations have been more frequent and persistent.  She notes that they have improved significant with addition of metoprolol; she is taking 12.5 mg twice daily (not prn).  She has experienced mild coughing with metoprolol (this also occurred with atenolol in the past), though she tolerates it and wishes to continue standing metoprolol.  She describes the palpitations as a fluttering that is most noticeable when she is lying in bed (especially with her left side down).  She often rolls onto her right side and notices the flutters cease.  There is some associated lightheadedness.  [name] also reports increasing chest pain.  Initially, she would feel a sharp pain in the center of her chest that would last a couple of minutes.  Now, it feels more like needles sticking her in the chest.  The pain only lasts for a moment but comes and goes.  She denies shortness of breath, orthopnea, PND, and edema.  She has not undergone heart testing in the  past.  --------------------------------------------------------------------------------------------------  Cardiovascular History & Procedures: Cardiovascular Problems:  Palpitations  Chest pain  Risk Factors:  Hypertension  Cath/PCI:  None  CV Surgery:  None  EP Procedures and Devices:  None  Non-Invasive Evaluation(s):  Carotid Doppler (08/21/2016): No significant carotid atherosclerosis.  Antegrade vertebral artery flow bilaterally.  Recent CV Pertinent Labs: Lab Results  Component Value Date   CHOL 223 (H) 08/19/2020   HDL 89.70 08/19/2020   LDLCALC 122 (H) 08/19/2020   LDLDIRECT 126.7 10/23/2011   TRIG 54.0 08/19/2020   CHOLHDL 2 08/19/2020   INR 0.94 11/15/2018   K 3.5 03/17/2021   MG 2.2 03/17/2021   BUN 9 03/17/2021   CREATININE 0.77 03/17/2021    --------------------------------------------------------------------------------------------------  Past Medical History:  Diagnosis Date  . Back pain   . Chronic back pain   . GERD (gastroesophageal reflux disease)   . Headache(784.0)   . History of nephrolithiasis   . Hypertension   . Plantar fasciitis   . Sleep apnea   . Ulcer 1980's   peptic ulcer disease    Past Surgical History:  Procedure Laterality Date  . ABDOMINAL HYSTERECTOMY  2002  . CHOLECYSTECTOMY  1984  . COLONOSCOPY  12/18/2011   Normal  . IR ANGIO EXTERNAL CAROTID SEL EXT CAROTID BILAT MOD SED  11/15/2018  . IR ANGIO INTRA EXTRACRAN SEL INTERNAL CAROTID BILAT MOD SED  11/15/2018  . IR ANGIO VERTEBRAL SEL VERTEBRAL UNI R MOD SED  11/15/2018  . IR US GUIDE VASC ACCESS RIGHT  11/15/2018  . OOPHORECTOMY  Current Meds  Medication Sig  . amLODipine (NORVASC) 5 MG tablet TAKE 1 TABLET BY MOUTH EVERY DAY  . celecoxib (CELEBREX) 200 MG capsule TAKE 1 CAPSULE (200 MG TOTAL) BY MOUTH DAILY AS NEEDED FOR MODERATE PAIN. WITH FOOD  . Cholecalciferol (VITAMIN D3) 2000 UNITS capsule Take 2,000 Units by mouth daily.  Marland Kitchen gabapentin  (NEURONTIN) 300 MG capsule Take 300 mg by mouth 3 (three) times daily as needed.  . metoprolol tartrate (LOPRESSOR) 25 MG tablet Take 0.5 tablets (12.5 mg total) by mouth 2 (two) times daily as needed (palpitations). (Patient taking differently: Take 12.5 mg by mouth 2 (two) times daily.)  . omeprazole (PRILOSEC) 20 MG capsule TAKE 1 CAPSULE (20 MG TOTAL) BY MOUTH DAILY AS NEEDED.    Allergies: Atenolol and Hydrocodone  Social History   Tobacco Use  . Smoking status: Never Smoker  . Smokeless tobacco: Never Used  Vaping Use  . Vaping Use: Every day  Substance Use Topics  . Alcohol use: No  . Drug use: No    Family History  Problem Relation Age of Onset  . Stroke Father   . Hypertension Father   . Diabetes Father   . Cancer Brother        stomach tumor    Review of Systems: Ms. Hurlock has noticed the corners of her mouth to be damp at times.  She denies other focal neurologic changes.  Otherwise, a 12-system review of systems was performed and was negative except as noted in the HPI.  --------------------------------------------------------------------------------------------------  Physical Exam: BP 110/78 (BP Location: Right Arm, Patient Position: Sitting, Cuff Size: Normal)   Pulse 86   Ht 5\' 3"  (1.6 m)   Wt 167 lb (75.8 kg)   SpO2 98%   BMI 29.58 kg/m   General:  NAD. HEENT: No conjunctival pallor or scleral icterus. Facemask in place. Neck: Supple without lymphadenopathy, thyromegaly, JVD, or HJR. No carotid bruit. Lungs: Normal work of breathing. Clear to auscultation bilaterally without wheezes or crackles. Heart: Regular rate and rhythm without murmurs, rubs, or gallops. Non-displaced PMI. Abd: Bowel sounds present. Soft, NT/ND without hepatosplenomegaly Ext: No lower extremity edema. Radial, PT, and DP pulses are 2+ bilaterally Skin: Warm and dry without rash. Neuro: CNIII-XII intact. Strength and fine-touch sensation intact in upper and lower extremities  bilaterally. Psych: Normal mood and affect.  EKG:  Normal sinus rhythm with nonspecific T wave changes.  Heart rate has decreased since 03/17/2021.  Otherwise, there has been no significant interval change.  Lab Results  Component Value Date   WBC 5.2 03/17/2021   HGB 15.1 (H) 03/17/2021   HCT 49.5 (H) 03/17/2021   MCV 81.1 03/17/2021   PLT 240 03/17/2021    Lab Results  Component Value Date   NA 141 03/17/2021   K 3.5 03/17/2021   CL 105 03/17/2021   CO2 24 03/17/2021   BUN 9 03/17/2021   CREATININE 0.77 03/17/2021   GLUCOSE 87 03/17/2021   ALT 36 (H) 08/19/2020    Lab Results  Component Value Date   CHOL 223 (H) 08/19/2020   HDL 89.70 08/19/2020   LDLCALC 122 (H) 08/19/2020   LDLDIRECT 126.7 10/23/2011   TRIG 54.0 08/19/2020   CHOLHDL 2 08/19/2020   Lab Results  Component Value Date   TSH 1.359 03/17/2021   --------------------------------------------------------------------------------------------------  ASSESSMENT AND PLAN: Palpitations: Symptoms have been present for ~1 year but worsened significantly earlier this month.  Standing metoprolol has been helping, which I encouraged Ms. Heacox  to continue.  If her cough becomes more problematic or palpitations worsen, we could try switching her to diltiazem instead.  I have recommended that we obtain a 14-day event monitor to better characterize her palpitations.  We will also arrange for an echocardiogram to exclude structural abnormalities in the setting of intermittent atypical chest pain and nonspecific EKG abnormalities noted today and during recent ED visit.  Chest pain and abnormal EKG: Chest pain is atypical.  EKG today again shows nonspecific T wave changes.  Only cardiac risk factor is hypertension.  We will obtain an event monitor as above, as well as an echocardiogram for further evaluation.  Ms. Balbuena should continue low-dose metoprolol.  If monitor and echocardiogram are unrevealing and pain does not  improve with conservative measures, we will need to consider noninvasive ischemia testing when Ms. Sachse returns for follow-up.  Hypertension: Blood pressure is normal today.  No medication changes are recommended at this time.  Follow-up: Return to clinic in 6 weeks.  Nelva Bush, MD 04/04/2021 11:36 AM

## 2021-04-05 ENCOUNTER — Encounter: Payer: Self-pay | Admitting: Internal Medicine

## 2021-04-07 ENCOUNTER — Telehealth: Payer: Self-pay | Admitting: Internal Medicine

## 2021-04-07 NOTE — Telephone Encounter (Signed)
Patient states she is wearing a monitor and want to know if she can have surgery to have a cyst removed, Please assist.

## 2021-04-07 NOTE — Telephone Encounter (Signed)
Spoke with patient and she is going to have an outpatient surgery at the end of May. She inquired if the monitor could be worn during that time. Advised that she should remove monitor on 5/13 which is a week or so before her outpatient surgery so that should be fine. She verbalized understanding with no further questions at this time.

## 2021-04-25 ENCOUNTER — Encounter: Payer: Self-pay | Admitting: Family Medicine

## 2021-04-25 ENCOUNTER — Other Ambulatory Visit: Payer: Self-pay

## 2021-04-25 ENCOUNTER — Ambulatory Visit (INDEPENDENT_AMBULATORY_CARE_PROVIDER_SITE_OTHER): Payer: BC Managed Care – PPO | Admitting: Family Medicine

## 2021-04-25 VITALS — BP 126/84 | HR 99 | Temp 96.9°F | Ht 63.0 in | Wt 165.5 lb

## 2021-04-25 DIAGNOSIS — N3000 Acute cystitis without hematuria: Secondary | ICD-10-CM

## 2021-04-25 DIAGNOSIS — R8290 Unspecified abnormal findings in urine: Secondary | ICD-10-CM

## 2021-04-25 LAB — POC URINALSYSI DIPSTICK (AUTOMATED)
Bilirubin, UA: 1
Blood, UA: NEGATIVE
Glucose, UA: NEGATIVE
Ketones, UA: NEGATIVE
Nitrite, UA: NEGATIVE
Protein, UA: POSITIVE — AB
Spec Grav, UA: 1.02 (ref 1.010–1.025)
Urobilinogen, UA: 0.2 E.U./dL
pH, UA: 6.5 (ref 5.0–8.0)

## 2021-04-25 MED ORDER — CEPHALEXIN 500 MG PO CAPS: 500.0000 mg | ORAL_CAPSULE | Freq: Two times a day (BID) | ORAL | 0 refills | Status: DC

## 2021-04-25 NOTE — Progress Notes (Signed)
Subjective:    Patient ID: Mindy Long, female    DOB: 22-Jul-1961, 60 y.o.   MRN: [id number]  This visit occurred during the SARS-CoV-2 public health emergency.  Safety protocols were in place, including screening questions prior to the visit, additional usage of staff PPE, and extensive cleaning of exam room while observing appropriate contact time as indicated for disinfecting solutions.    HPI Pt presents with urinary symptoms   Wt Readings from Last 3 Encounters:  04/25/21 165 lb 8 oz (75.1 kg)  04/04/21 167 lb (75.8 kg)  04/01/21 167 lb (75.8 kg)   29.32 kg/m   Symptoms started yesterday am  Feels crampy  Very strong urine odor  No pain to urinate No blood in urine   Frequency and urgency are getting worse   No nausea or fever   Does not get utis often  May not be drinking enough water  No bowel changes    Results for orders placed or performed in visit on 04/25/21  POCT Urinalysis Dipstick (Automated)  Result Value Ref Range   Color, UA Yellow    Clarity, UA Hazy    Glucose, UA Negative Negative   Bilirubin, UA 1 mg/dL    Ketones, UA Negative    Spec Grav, UA 1.020 1.010 - 1.025   Blood, UA Negative    pH, UA 6.5 5.0 - 8.0   Protein, UA Positive (A) Negative   Urobilinogen, UA 0.2 0.2 or 1.0 E.U./dL   Nitrite, UA Negative    Leukocytes, UA Small (1+) (A) Negative    Patient Active Problem List   Diagnosis Date Noted  . Acute cystitis 04/25/2021  . Chest pain 04/01/2021  . Localized skin mass, lump, or swelling 10/28/2020  . Cyst of skin of breast 09/25/2019  . Abnormal serum ACE level 04/08/2018  . Eye exam abnormal 08/04/2017  . Tinnitus 11/13/2014  . History of shingles 04/27/2014  . Low back pain 04/20/2014  . OSA (obstructive sleep apnea) 09/27/2012  . Snoring 06/21/2012  . Fatigue 06/20/2012  . Colon cancer screening 11/04/2011  . Routine general medical examination at a health care facility 10/22/2011  . Vitamin D deficiency  07/02/2010  . Palpitations 04/07/2010  . HYPOKALEMIA 07/10/2009  . Allergic rhinitis 04/03/2008  . Essential hypertension 01/25/2008  . G E R D 07/05/2007  . H/O peptic ulcer 07/05/2007  . Headache, chronic daily 07/05/2007  . NEPHROLITHIASIS, HX OF 07/05/2007   Past Medical History:  Diagnosis Date  . Back pain   . Chronic back pain   . GERD (gastroesophageal reflux disease)   . Headache(784.0)   . History of nephrolithiasis   . Hypertension   . Plantar fasciitis   . Sleep apnea   . Ulcer 1980's   peptic ulcer disease   Past Surgical History:  Procedure Laterality Date  . ABDOMINAL HYSTERECTOMY  2002  . CHOLECYSTECTOMY  1984  . COLONOSCOPY  12/18/2011   Normal  . IR ANGIO EXTERNAL CAROTID SEL EXT CAROTID BILAT MOD SED  11/15/2018  . IR ANGIO INTRA EXTRACRAN SEL INTERNAL CAROTID BILAT MOD SED  11/15/2018  . IR ANGIO VERTEBRAL SEL VERTEBRAL UNI R MOD SED  11/15/2018  . IR US GUIDE VASC ACCESS RIGHT  11/15/2018  . OOPHORECTOMY     Social History   Tobacco Use  . Smoking status: Never Smoker  . Smokeless tobacco: Never Used  Vaping Use  . Vaping Use: Every day  Substance Use Topics  . Alcohol use:  No  . Drug use: No   Family History  Problem Relation Age of Onset  . Stroke Father   . Hypertension Father   . Diabetes Father   . Cancer Brother        stomach tumor   Allergies  Allergen Reactions  . Atenolol Cough  . Hydrocodone Nausea Only   Current Outpatient Medications on File Prior to Visit  Medication Sig Dispense Refill  . amLODipine (NORVASC) 5 MG tablet TAKE 1 TABLET BY MOUTH EVERY DAY 90 tablet 1  . celecoxib (CELEBREX) 200 MG capsule TAKE 1 CAPSULE (200 MG TOTAL) BY MOUTH DAILY AS NEEDED FOR MODERATE PAIN. WITH FOOD 30 capsule 5  . Cholecalciferol (VITAMIN D3) 2000 UNITS capsule Take 2,000 Units by mouth daily.    Marland Kitchen gabapentin (NEURONTIN) 300 MG capsule Take 300 mg by mouth 3 (three) times daily as needed.    . metoprolol tartrate (LOPRESSOR) 25 MG  tablet Take 12.5 mg by mouth 2 (two) times daily.    Marland Kitchen omeprazole (PRILOSEC) 20 MG capsule TAKE 1 CAPSULE (20 MG TOTAL) BY MOUTH DAILY AS NEEDED. 270 capsule 1   No current facility-administered medications on file prior to visit.    Review of Systems  Constitutional: Positive for fatigue. Negative for activity change, appetite change, fever and unexpected weight change.  HENT: Negative for congestion, ear pain, rhinorrhea, sinus pressure and sore throat.   Eyes: Negative for pain, redness and visual disturbance.  Respiratory: Negative for cough, shortness of breath and wheezing.   Cardiovascular: Negative for chest pain and palpitations.  Gastrointestinal: Negative for abdominal pain, blood in stool, constipation and diarrhea.  Endocrine: Negative for polydipsia and polyuria.  Genitourinary: Positive for frequency and urgency. Negative for dysuria and hematuria.  Musculoskeletal: Negative for arthralgias, back pain and myalgias.  Skin: Negative for pallor and rash.  Allergic/Immunologic: Negative for environmental allergies.  Neurological: Negative for dizziness, syncope and headaches.  Hematological: Negative for adenopathy. Does not bruise/bleed easily.  Psychiatric/Behavioral: Negative for decreased concentration and dysphoric mood. The patient is not nervous/anxious.        Objective:   Physical Exam Constitutional:      General: She is not in acute distress.    Appearance: She is well-developed.  HENT:     Head: Normocephalic and atraumatic.  Eyes:     Conjunctiva/sclera: Conjunctivae normal.     Pupils: Pupils are equal, round, and reactive to light.  Cardiovascular:     Rate and Rhythm: Normal rate and regular rhythm.     Heart sounds: Normal heart sounds.  Pulmonary:     Effort: Pulmonary effort is normal.     Breath sounds: Normal breath sounds.  Abdominal:     General: Bowel sounds are normal. There is no distension.     Palpations: Abdomen is soft.      Tenderness: There is abdominal tenderness. There is no rebound.     Comments: No cva tenderness  Mild suprapubic tenderness  Musculoskeletal:     Cervical back: Normal range of motion and neck supple.  Lymphadenopathy:     Cervical: No cervical adenopathy.  Skin:    Findings: No rash.  Neurological:     Mental Status: She is alert.  Psychiatric:        Mood and Affect: Mood normal.           Assessment & Plan:   Problem List Items Addressed This Visit      Genitourinary   Acute cystitis - Primary  Pain in bladder area with frequency and urgency and leuk on ua tx with keflex  Enc good fluid intake  Culture pending-will update  inst to reach out if symptoms suddenly worsen      Relevant Orders   Urine Culture (Completed)    Other Visit Diagnoses    Abnormal urine odor       Relevant Orders   POCT Urinalysis Dipstick (Automated) (Completed)

## 2021-04-25 NOTE — Patient Instructions (Signed)
Drink lots of water   Take the keflex as directed for uti   If symptoms suddenly worse- call the office   We will contact you with culture results    The shingles vaccine is called shingrix  If you are interested in the shingles vaccine series (Shingrix), call your insurance or pharmacy to check on coverage and location it must be given.  If affordable - you can schedule it here or at your pharmacy depending on coverage

## 2021-04-26 LAB — URINE CULTURE
MICRO NUMBER:: 11916405
Result:: NO GROWTH
SPECIMEN QUALITY:: ADEQUATE

## 2021-04-27 NOTE — Assessment & Plan Note (Signed)
Pain in bladder area with frequency and urgency and leuk on ua tx with keflex  Enc good fluid intake  Culture pending-will update  inst to reach out if symptoms suddenly worsen

## 2021-04-28 NOTE — Patient Instructions (Signed)
DUE TO COVID-19 ONLY ONE VISITOR IS ALLOWED TO COME WITH YOU AND STAY IN THE WAITING ROOM ONLY DURING PRE OP AND PROCEDURE DAY OF SURGERY. THE 1 VISITOR  MAY VISIT WITH YOU AFTER SURGERY IN YOUR PRIVATE ROOM DURING VISITING HOURS ONLY!  YOU NEED TO HAVE A COVID 19 TEST ON: 05/16/21 @ 11:00 AM , THIS TEST MUST BE DONE BEFORE SURGERY,  COVID TESTING SITE [place] [address], IT IS ON THE RIGHT GOING OUT [address] APPROXIMATELY  2 MINUTES PAST [employer] ON THE RIGHT. ONCE YOUR COVID TEST IS COMPLETED,  PLEASE BEGIN THE QUARANTINE INSTRUCTIONS AS OUTLINED IN YOUR HANDOUT.                [place]   Your procedure is scheduled on: 05/20/21    Report to [facility] Main  Entrance   Report to admitting at: 8:30 AM     Call this number if you have problems the morning of surgery [phone]    Remember: Do not eat solid food :After Midnight. Clear liquids until: 7:30 am.  CLEAR LIQUID DIET  Foods Allowed                                                                     Foods Excluded  Coffee and tea, regular and decaf                             liquids that you cannot  Plain Jell-O any favor except red or purple                                           see through such as: Fruit ices (not with fruit pulp)                                     milk, soups, orange juice  Iced Popsicles                                    All solid food Carbonated beverages, regular and diet                                    Cranberry, grape and apple juices Sports drinks like Gatorade Lightly seasoned clear broth or consume(fat free) Sugar, honey syrup  Sample Menu Breakfast                                Lunch                                     Supper Cranberry juice  Beef broth                            Chicken broth Jell-O                                     Grape juice                           Apple juice Coffee or tea                         Jell-O                                      Popsicle                                                Coffee or tea                        Coffee or tea  _____________________________________________________________________  BRUSH YOUR TEETH MORNING OF SURGERY AND RINSE YOUR MOUTH OUT, NO CHEWING GUM CANDY OR MINTS.    Take these medicines the morning of surgery with A SIP OF WATER: gabapentin,metropolol,amlodipine,keflex,omeprazole.                            You may not have any metal on your body including hair pins and              piercings  Do not wear jewelry, make-up, lotions, powders or perfumes, deodorant             Do not wear nail polish on your fingernails.  Do not shave  48 hours prior to surgery.    Do not bring valuables to the hospital. [place].  Contacts, dentures or bridgework may not be worn into surgery.  Leave suitcase in the car. After surgery it may be brought to your room.     Patients discharged the day of surgery will not be allowed to drive home. IF YOU ARE HAVING SURGERY AND GOING HOME THE SAME DAY, YOU MUST HAVE AN ADULT TO DRIVE YOU HOME AND BE WITH YOU FOR 24 HOURS. YOU MAY GO HOME BY TAXI OR UBER OR ORTHERWISE, BUT AN ADULT MUST ACCOMPANY YOU HOME AND STAY WITH YOU FOR 24 HOURS.  Name and phone number of your driver:  Special Instructions: N/A              Please read over the following fact sheets you were given: _____________________________________________________________________         [facility] - Preparing for Surgery Before surgery, you can play an important role.  Because skin is not sterile, your skin needs to be as free of germs as possible.  You can reduce the number of germs on your skin by washing with CHG (chlorahexidine gluconate) soap before surgery.  CHG is an antiseptic cleaner which kills germs  and bonds with the skin to continue killing germs even after washing. Please  DO NOT use if you have an allergy to CHG or antibacterial soaps.  If your skin becomes reddened/irritated stop using the CHG and inform your nurse when you arrive at Short Stay. Do not shave (including legs and underarms) for at least 48 hours prior to the first CHG shower.  You may shave your face/neck. Please follow these instructions carefully:  1.  Shower with CHG Soap the night before surgery and the  morning of Surgery.  2.  If you choose to wash your hair, wash your hair first as usual with your  normal  shampoo.  3.  After you shampoo, rinse your hair and body thoroughly to remove the  shampoo.                           4.  Use CHG as you would any other liquid soap.  You can apply chg directly  to the skin and wash                       Gently with a scrungie or clean washcloth.  5.  Apply the CHG Soap to your body ONLY FROM THE NECK DOWN.   Do not use on face/ open                           Wound or open sores. Avoid contact with eyes, ears mouth and genitals (private parts).                       Wash face,  Genitals (private parts) with your normal soap.             6.  Wash thoroughly, paying special attention to the area where your surgery  will be performed.  7.  Thoroughly rinse your body with warm water from the neck down.  8.  DO NOT shower/wash with your normal soap after using and rinsing off  the CHG Soap.                9.  Pat yourself dry with a clean towel.            10.  Wear clean pajamas.            11.  Place clean sheets on your bed the night of your first shower and do not  sleep with pets. Day of Surgery : Do not apply any lotions/deodorants the morning of surgery.  Please wear clean clothes to the hospital/surgery center.  FAILURE TO FOLLOW THESE INSTRUCTIONS MAY RESULT IN THE CANCELLATION OF YOUR SURGERY PATIENT SIGNATURE_________________________________  NURSE  SIGNATURE__________________________________  ________________________________________________________________________

## 2021-04-28 NOTE — Progress Notes (Signed)
Pt. Needs orders for upcomming surgery.PAT and labs on 03/30/21.

## 2021-04-29 ENCOUNTER — Encounter (HOSPITAL_COMMUNITY): Admission: RE | Admit: 2021-04-29 | Payer: BC Managed Care – PPO | Source: Ambulatory Visit

## 2021-04-29 ENCOUNTER — Encounter (HOSPITAL_COMMUNITY): Payer: Self-pay

## 2021-04-29 ENCOUNTER — Other Ambulatory Visit: Payer: Self-pay

## 2021-04-29 NOTE — Progress Notes (Signed)
COVID Vaccine Completed: Yes Date COVID Vaccine completed: 03/25/20 COVID vaccine manufacturer: Pfizer      PCP -Dr. Loura Pardon. LOV: 04/25/21  Cardiologist - Dr. Harrell Gave End. LOV: 04/04/21  Chest x-ray - 03/17/21 EKG - 04/04/21: EPIC Stress Test - schedule: 05/13/21 ECHO -  Cardiac Cath -  Pacemaker/ICD device last checked:  Sleep Study - Yes CPAP - No  Fasting Blood Sugar -  Checks Blood Sugar _____ times a day  Blood Thinner Instructions: Aspirin Instructions: Last Dose:  Anesthesia review: Hx: HTN,OSA(No CPAP),palpitations,chest pain  Patient denies shortness of breath, fever, cough and chest pain at PAT appointment   Patient verbalized understanding of instructions that were given to them at the PAT appointment. Patient was also instructed that they will need to review over the PAT instructions again at home before surgery.

## 2021-05-01 ENCOUNTER — Telehealth: Payer: Self-pay

## 2021-05-01 NOTE — Telephone Encounter (Signed)
Able to reach pt regarding her recent Zio, End had a chance to review her results and advised   "Please let the patient know that her event monitor showed a few extra beats and two episodes of a fast heartbeat lasting about 10 seconds. She should follow-up as previously arranged to reassess her symptoms and determine if further testing/intervention is necessary."  Pt is thankful for the phone call, will keep her echo appt on 6/7 and f/u with Mickle Plumb on 6/9. At this time, pt reports she is feeling well and has not had any episodes.  Otherwise will see pt next month for testing and clinic visit.

## 2021-05-06 ENCOUNTER — Telehealth: Payer: Self-pay | Admitting: *Deleted

## 2021-05-06 NOTE — Telephone Encounter (Signed)
Drink lots of fluids She would be ok to take mucinex DM or robitussin DM for cough and congestion  I hope that helps Keep [place] posted

## 2021-05-06 NOTE — Telephone Encounter (Signed)
Patient called stating that she started with cold symptoms Sunday. Patient stated that she has had a cough, head congestion and runny nose. Patient denies that she has had a fever. Patient stated that she did a home covid test last night and it was negative. Patient stated that she has not been around anyone with covid that she knows of.  Patient wants to know what she can take over the counter for her symptoms because of the medications that she is on. Patient was given ER precautions and she verbalized understanding.

## 2021-05-06 NOTE — Telephone Encounter (Signed)
Pt is aware and voices understanding.

## 2021-05-08 ENCOUNTER — Other Ambulatory Visit: Payer: Self-pay

## 2021-05-08 ENCOUNTER — Encounter (HOSPITAL_COMMUNITY)
Admission: RE | Admit: 2021-05-08 | Discharge: 2021-05-08 | Disposition: A | Discharge status: Home / Self Care | Payer: BC Managed Care – PPO | Source: Ambulatory Visit | Attending: Surgery | Admitting: Surgery

## 2021-05-08 DIAGNOSIS — Z01812 Encounter for preprocedural laboratory examination: Secondary | ICD-10-CM | POA: Diagnosis not present

## 2021-05-08 LAB — CBC
HCT: 45.8 % (ref 36.0–46.0)
Hemoglobin: 14.2 g/dL (ref 12.0–15.0)
MCH: 25 pg — ABNORMAL LOW (ref 26.0–34.0)
MCHC: 31 g/dL (ref 30.0–36.0)
MCV: 80.8 fL (ref 80.0–100.0)
Platelets: 220 10*3/uL (ref 150–400)
RBC: 5.67 MIL/uL — ABNORMAL HIGH (ref 3.87–5.11)
RDW: 14.1 % (ref 11.5–15.5)
WBC: 4.8 10*3/uL (ref 4.0–10.5)
nRBC: 0 % (ref 0.0–0.2)

## 2021-05-08 LAB — BASIC METABOLIC PANEL
Anion gap: 7 (ref 5–15)
BUN: 11 mg/dL (ref 8–23)
CO2: 27 mmol/L (ref 22–32)
Calcium: 9 mg/dL (ref 8.9–10.3)
Chloride: 105 mmol/L (ref 98–111)
Creatinine, Ser: 0.81 mg/dL (ref 0.44–1.00)
GFR, Estimated: >60 mL/min (ref >60)
Glucose, Bld: 99 mg/dL (ref 70–99)
Potassium: 3.4 mmol/L — ABNORMAL LOW (ref 3.5–5.1)
Sodium: 139 mmol/L (ref 135–145)

## 2021-05-10 DIAGNOSIS — Z20822 Contact with and (suspected) exposure to covid-19: Secondary | ICD-10-CM | POA: Diagnosis not present

## 2021-05-12 NOTE — Telephone Encounter (Signed)
Pt called back stating PCR and home covid tests have come back positive. She has been taking Robitussin DM. Has almost finished the bottle. Still having a very productive cough. Asking is she should continue with the Robitussin DM or get something different. If she gets a Rx, send to [facility].

## 2021-05-13 ENCOUNTER — Encounter: Payer: Self-pay | Admitting: Family Medicine

## 2021-05-13 ENCOUNTER — Other Ambulatory Visit: Payer: Self-pay

## 2021-05-13 ENCOUNTER — Other Ambulatory Visit: Payer: BC Managed Care – PPO

## 2021-05-13 ENCOUNTER — Telehealth (INDEPENDENT_AMBULATORY_CARE_PROVIDER_SITE_OTHER): Payer: BC Managed Care – PPO | Admitting: Family Medicine

## 2021-05-13 DIAGNOSIS — U071 COVID-19: Secondary | ICD-10-CM | POA: Diagnosis not present

## 2021-05-13 MED ORDER — PROMETHAZINE-DM 6.25-15 MG/5ML PO SYRP: 5.0000 mL | ORAL_SOLUTION | Freq: Four times a day (QID) | ORAL | 0 refills | Status: DC | PRN

## 2021-05-13 MED ORDER — BENZONATATE 200 MG PO CAPS: 200.0000 mg | ORAL_CAPSULE | Freq: Three times a day (TID) | ORAL | 1 refills | Status: DC | PRN

## 2021-05-13 NOTE — Telephone Encounter (Signed)
Pt called back because she had not heard back from anyone yesterday. I realized I had not routed the message to Dr Glori Bickers. I apologized to the pt and let her know I did send it right then to Dr Glori Bickers.

## 2021-05-13 NOTE — Telephone Encounter (Signed)
Virtual visit done today with PCP

## 2021-05-13 NOTE — Telephone Encounter (Signed)
Would she like to do a virtual visit now?

## 2021-05-13 NOTE — Assessment & Plan Note (Signed)
First symptom was about 2 wk ago and overall better but still dealing with persistent cough and some nasal congestion (clear mucous) Out of window for anti virals  Pt is immunized  Reassuring hx  Px prometh DM and tessalon to help post viral cough and congestion Urged to also continue nasal saline Watch for wheeze, facial pain or return of fever  Update if not starting to improve in a week or if worsening

## 2021-05-13 NOTE — Patient Instructions (Signed)
Continue to drink fluids and rest  Try tessalon and prometh dm for cough (watch for sedation)  If no improvement please alert [place]  Continue nasal saline spray/irrigation for nasal congestion   Update if not starting to improve in a week or if worsening  Especially if any wheezing or tight chest or facial pain

## 2021-05-13 NOTE — Progress Notes (Signed)
Virtual Visit via Video Note  I connected with [name] on 05/13/21 at 12:00 PM EDT by a video enabled telemedicine application and verified that I am speaking with the correct person using two identifiers.  Location: Patient: home Provider: office   I discussed the limitations of evaluation and management by telemedicine and the availability of in person appointments. The patient expressed understanding and agreed to proceed.  Parties involved in encounter  Patient: Mindy Long  Provider:  Loura Pardon MD   History of Present Illness: Pt presents with c/o covid illness with positive home test  Did first test on 5/31 -that was negative , had cold symptoms  Then husband [facility]-positive Did a 2nd test herself and that was positive   Almost 2 wk out Chills /malaise are better   Still coughing a lot and some congestion  Used robitussin and not much help   All mucous is clear  No sob or tight chest  Cough is productive  No pain in face   Drinking lots of fluids Some fatigue still     pt is covid immunized with booster    Lab Results  Component Value Date   CREATININE 0.81 05/08/2021   BUN 11 05/08/2021   NA 139 05/08/2021   K 3.4 (L) 05/08/2021   CL 105 05/08/2021   CO2 27 05/08/2021   Patient Active Problem List   Diagnosis Date Noted  . COVID-19 05/13/2021  . Acute cystitis 04/25/2021  . Chest pain 04/01/2021  . Localized skin mass, lump, or swelling 10/28/2020  . Cyst of skin of breast 09/25/2019  . Abnormal serum ACE level 04/08/2018  . Eye exam abnormal 08/04/2017  . Tinnitus 11/13/2014  . History of shingles 04/27/2014  . Low back pain 04/20/2014  . OSA (obstructive sleep apnea) 09/27/2012  . Snoring 06/21/2012  . Fatigue 06/20/2012  . Colon cancer screening 11/04/2011  . Routine general medical examination at a health care facility 10/22/2011  . Vitamin D deficiency 07/02/2010  . Palpitations 04/07/2010  . HYPOKALEMIA  07/10/2009  . Allergic rhinitis 04/03/2008  . Essential hypertension 01/25/2008  . G E R D 07/05/2007  . H/O peptic ulcer 07/05/2007  . Headache, chronic daily 07/05/2007  . NEPHROLITHIASIS, HX OF 07/05/2007   Past Medical History:  Diagnosis Date  . Back pain   . Chronic back pain   . GERD (gastroesophageal reflux disease)   . Headache(784.0)   . History of nephrolithiasis   . Hypertension   . Plantar fasciitis   . Sleep apnea   . Ulcer 1980's   peptic ulcer disease   Past Surgical History:  Procedure Laterality Date  . ABDOMINAL HYSTERECTOMY  2002  . CHOLECYSTECTOMY  1984  . COLONOSCOPY  12/18/2011   Normal  . IR ANGIO EXTERNAL CAROTID SEL EXT CAROTID BILAT MOD SED  11/15/2018  . IR ANGIO INTRA EXTRACRAN SEL INTERNAL CAROTID BILAT MOD SED  11/15/2018  . IR ANGIO VERTEBRAL SEL VERTEBRAL UNI R MOD SED  11/15/2018  . IR US GUIDE VASC ACCESS RIGHT  11/15/2018  . OOPHORECTOMY     Social History   Tobacco Use  . Smoking status: Never Smoker  . Smokeless tobacco: Never Used  Vaping Use  . Vaping Use: Never used  Substance Use Topics  . Alcohol use: Yes    Comment: occas.  . Drug use: No   Family History  Problem Relation Age of Onset  . Stroke Father   . Hypertension Father   .  Diabetes Father   . Cancer Brother        stomach tumor   Allergies  Allergen Reactions  . Atenolol Cough  . Hydrocodone Nausea Only   Current Outpatient Medications on File Prior to Visit  Medication Sig Dispense Refill  . amLODipine (NORVASC) 5 MG tablet TAKE 1 TABLET BY MOUTH EVERY DAY (Patient taking differently: Take 5 mg by mouth daily. TAKE 1 TABLET BY MOUTH EVERY DAY) 90 tablet 1  . celecoxib (CELEBREX) 200 MG capsule TAKE 1 CAPSULE (200 MG TOTAL) BY MOUTH DAILY AS NEEDED FOR MODERATE PAIN. WITH FOOD 30 capsule 5  . Cholecalciferol (VITAMIN D3) 2000 UNITS capsule Take 2,000 Units by mouth daily.    Marland Kitchen gabapentin (NEURONTIN) 300 MG capsule Take 300 mg by mouth 3 (three) times  daily as needed (pain).    . metoprolol tartrate (LOPRESSOR) 25 MG tablet Take 12.5 mg by mouth 2 (two) times daily.    Marland Kitchen omeprazole (PRILOSEC) 20 MG capsule TAKE 1 CAPSULE (20 MG TOTAL) BY MOUTH DAILY AS NEEDED. (Patient taking differently: Take 20 mg by mouth daily as needed (acid reflux).) 270 capsule 1  . tiZANidine (ZANAFLEX) 4 MG capsule Take 4 mg by mouth 3 (three) times daily as needed for muscle spasms.     No current facility-administered medications on file prior to visit.   Review of Systems  Constitutional: Positive for malaise/fatigue. Negative for chills and fever.  HENT: Positive for congestion. Negative for ear pain, sinus pain and sore throat.   Eyes: Negative for blurred vision, discharge and redness.  Respiratory: Positive for cough and sputum production. Negative for shortness of breath, wheezing and stridor.   Cardiovascular: Negative for chest pain, palpitations and leg swelling.  Gastrointestinal: Negative for abdominal pain, diarrhea, nausea and vomiting.  Musculoskeletal: Negative for myalgias.  Skin: Negative for rash.  Neurological: Negative for dizziness and headaches.     Observations/Objective: Patient appears well, in no distress Weight is baseline  No facial swelling or asymmetry Voice is slt hoarse No obvious tremor or mobility impairment Moving neck and UEs normally Able to hear the call well  Cough sounds dry and hacking No audible wheeze or sob  Talkative and mentally sharp with no cognitive changes No skin changes on face or neck , no rash or pallor Affect is normal    Assessment and Plan: Problem List Items Addressed This Visit      Other   COVID-19    First symptom was about 2 wk ago and overall better but still dealing with persistent cough and some nasal congestion (clear mucous) Out of window for anti virals  Pt is immunized  Reassuring hx  Px prometh DM and tessalon to help post viral cough and congestion Urged to also continue  nasal saline Watch for wheeze, facial pain or return of fever  Update if not starting to improve in a week or if worsening              Follow Up Instructions: Continue to drink fluids and rest  Try tessalon and prometh dm for cough (watch for sedation)  If no improvement please alert [place]  Continue nasal saline spray/irrigation for nasal congestion   Update if not starting to improve in a week or if worsening  Especially if any wheezing or tight chest or facial pain    I discussed the assessment and treatment plan with the patient. The patient was provided an opportunity to ask questions and all were answered. The  patient agreed with the plan and demonstrated an understanding of the instructions.   The patient was advised to call back or seek an in-person evaluation if the symptoms worsen or if the condition fails to improve as anticipated.     Loura Pardon, MD

## 2021-05-15 ENCOUNTER — Ambulatory Visit: Payer: BC Managed Care – PPO | Admitting: Physician Assistant

## 2021-05-15 ENCOUNTER — Other Ambulatory Visit: Payer: Self-pay

## 2021-05-15 ENCOUNTER — Ambulatory Visit (INDEPENDENT_AMBULATORY_CARE_PROVIDER_SITE_OTHER): Payer: BC Managed Care – PPO

## 2021-05-15 DIAGNOSIS — R079 Chest pain, unspecified: Secondary | ICD-10-CM

## 2021-05-15 LAB — ECHOCARDIOGRAM COMPLETE
AR max vel: 2.51 cm2
AV Area VTI: 2.43 cm2
AV Area mean vel: 2.33 cm2
AV Mean grad: 3 mmHg
AV Peak grad: 6 mmHg
Ao pk vel: 1.22 m/s
Area-P 1/2: 4.36 cm2
Calc EF: 51.5 %
S' Lateral: 2.7 cm
Single Plane A2C EF: 51.8 %
Single Plane A4C EF: 50.1 %

## 2021-05-16 ENCOUNTER — Other Ambulatory Visit (HOSPITAL_COMMUNITY): Payer: BC Managed Care – PPO

## 2021-05-19 NOTE — H&P (Signed)
Chief Complaint:  mass of the left buttock  History of Present Illness:  Mindy Long is an 60 y.o. female with a mass in her left buttock.  This mass is 1.7 cm in diameter by ultrasound.  CT did not show any deep extension.   Past Medical History:  Diagnosis Date   Back pain    Chronic back pain    GERD (gastroesophageal reflux disease)    Headache(784.0)    History of nephrolithiasis    Hypertension    Plantar fasciitis    Sleep apnea    Ulcer 1980's   peptic ulcer disease    Past Surgical History:  Procedure Laterality Date   ABDOMINAL HYSTERECTOMY  2002   CHOLECYSTECTOMY  1984   COLONOSCOPY  12/18/2011   Normal   IR ANGIO EXTERNAL CAROTID SEL EXT CAROTID BILAT MOD SED  11/15/2018   IR ANGIO INTRA EXTRACRAN SEL INTERNAL CAROTID BILAT MOD SED  11/15/2018   IR ANGIO VERTEBRAL SEL VERTEBRAL UNI R MOD SED  11/15/2018   IR US GUIDE VASC ACCESS RIGHT  11/15/2018   OOPHORECTOMY      No current facility-administered medications for this encounter.   Current Outpatient Medications  Medication Sig Dispense Refill   amLODipine (NORVASC) 5 MG tablet TAKE 1 TABLET BY MOUTH EVERY DAY (Patient taking differently: Take 5 mg by mouth daily. TAKE 1 TABLET BY MOUTH EVERY DAY) 90 tablet 1   celecoxib (CELEBREX) 200 MG capsule TAKE 1 CAPSULE (200 MG TOTAL) BY MOUTH DAILY AS NEEDED FOR MODERATE PAIN. WITH FOOD 30 capsule 5   Cholecalciferol (VITAMIN D3) 2000 UNITS capsule Take 2,000 Units by mouth daily.     gabapentin (NEURONTIN) 300 MG capsule Take 300 mg by mouth 3 (three) times daily as needed (pain).     metoprolol tartrate (LOPRESSOR) 25 MG tablet Take 12.5 mg by mouth 2 (two) times daily.     omeprazole (PRILOSEC) 20 MG capsule TAKE 1 CAPSULE (20 MG TOTAL) BY MOUTH DAILY AS NEEDED. (Patient taking differently: Take 20 mg by mouth daily as needed (acid reflux).) 270 capsule 1   tiZANidine (ZANAFLEX) 4 MG capsule Take 4 mg by mouth 3 (three) times daily as needed for muscle spasms.      benzonatate (TESSALON) 200 MG capsule Take 1 capsule (200 mg total) by mouth 3 (three) times daily as needed. Swallow whole, do not bite pill 30 capsule 1   promethazine-dextromethorphan (PROMETHAZINE-DM) 6.25-15 MG/5ML syrup Take 5 mLs by mouth 4 (four) times daily as needed for cough. Caution of sedation 118 mL 0   Atenolol and Hydrocodone Family History  Problem Relation Age of Onset   Stroke Father    Hypertension Father    Diabetes Father    Cancer Brother        stomach tumor   Social History:   reports that she has never smoked. She has never used smokeless tobacco. She reports current alcohol use. She reports that she does not use drugs.   REVIEW OF SYSTEMS : Negative except for see problem list  Physical Exam:   There were no vitals taken for this visit. There is no height or weight on file to calculate BMI.  Gen:  WDWN AAF NAD  Neurological: Alert and oriented to person, place, and time. Motor and sensory function is grossly intact  Head: Normocephalic and atraumatic.  Eyes: Conjunctivae are normal. Pupils are equal, round, and reactive to light. No scleral icterus.  Neck: Normal range of motion. Neck supple.  No tracheal deviation or thyromegaly present.  Cardiovascular:  SR without murmurs or gallops.  No carotid bruits Breast:  not examied Respiratory: Effort normal.  No respiratory distress. No chest wall tenderness. Breath sounds normal.  No wheezes, rales or rhonchi.  Abdomen:  nontender GU:  mass of the left buttock Musculoskeletal: Normal range of motion. Extremities are nontender. No cyanosis, edema or clubbing noted Lymphadenopathy: No cervical, preauricular, postauricular or axillary adenopathy is present Skin: Skin is warm and dry. No rash noted. No diaphoresis. No erythema. No pallor. Pscyh: Normal mood and affect. Behavior is normal. Judgment and thought content normal.   LABORATORY RESULTS: No results found for this or any previous visit (from the past  48 hour(s)).   RADIOLOGY RESULTS: No results found.  Problem List: Patient Active Problem List   Diagnosis Date Noted   COVID-19 05/13/2021   Acute cystitis 04/25/2021   Chest pain 04/01/2021   Localized skin mass, lump, or swelling 10/28/2020   Cyst of skin of breast 09/25/2019   Abnormal serum ACE level 04/08/2018   Eye exam abnormal 08/04/2017   Tinnitus 11/13/2014   History of shingles 04/27/2014   Low back pain 04/20/2014   OSA (obstructive sleep apnea) 09/27/2012   Snoring 06/21/2012   Fatigue 06/20/2012   Colon cancer screening 11/04/2011   Routine general medical examination at a health care facility 10/22/2011   Vitamin D deficiency 07/02/2010   Palpitations 04/07/2010   HYPOKALEMIA 07/10/2009   Allergic rhinitis 04/03/2008   Essential hypertension 01/25/2008   G E R D 07/05/2007   H/O peptic ulcer 07/05/2007   Headache, chronic daily 07/05/2007   NEPHROLITHIASIS, HX OF 07/05/2007    Assessment & Plan: Palpable mass of the left buttocks for excision    Matt B. Hassell Done, MD, [facility] [phone] beeper [phone]  05/19/2021 7:31 AM

## 2021-05-20 ENCOUNTER — Encounter (HOSPITAL_COMMUNITY)
Admission: RE | Disposition: A | Discharge status: Home / Self Care | Payer: Self-pay | Source: Home / Self Care | Attending: Surgery

## 2021-05-20 ENCOUNTER — Ambulatory Visit (HOSPITAL_COMMUNITY): Payer: BC Managed Care – PPO | Admitting: Certified Registered Nurse Anesthetist

## 2021-05-20 ENCOUNTER — Encounter (HOSPITAL_COMMUNITY): Payer: Self-pay | Admitting: Surgery

## 2021-05-20 ENCOUNTER — Ambulatory Visit (HOSPITAL_COMMUNITY)
Admission: RE | Admit: 2021-05-20 | Discharge: 2021-05-20 | Disposition: A | Discharge status: Home / Self Care | Payer: BC Managed Care – PPO | Attending: Surgery | Admitting: Surgery

## 2021-05-20 DIAGNOSIS — U071 COVID-19: Secondary | ICD-10-CM

## 2021-05-20 DIAGNOSIS — G4733 Obstructive sleep apnea (adult) (pediatric): Secondary | ICD-10-CM | POA: Diagnosis not present

## 2021-05-20 DIAGNOSIS — K219 Gastro-esophageal reflux disease without esophagitis: Secondary | ICD-10-CM | POA: Insufficient documentation

## 2021-05-20 DIAGNOSIS — Z79899 Other long term (current) drug therapy: Secondary | ICD-10-CM | POA: Diagnosis not present

## 2021-05-20 DIAGNOSIS — E876 Hypokalemia: Secondary | ICD-10-CM | POA: Diagnosis not present

## 2021-05-20 DIAGNOSIS — U099 Post covid-19 condition, unspecified: Secondary | ICD-10-CM | POA: Diagnosis not present

## 2021-05-20 DIAGNOSIS — Z800 Family history of malignant neoplasm of digestive organs: Secondary | ICD-10-CM | POA: Diagnosis not present

## 2021-05-20 DIAGNOSIS — Z791 Long term (current) use of non-steroidal anti-inflammatories (NSAID): Secondary | ICD-10-CM | POA: Diagnosis not present

## 2021-05-20 DIAGNOSIS — R059 Cough, unspecified: Secondary | ICD-10-CM | POA: Insufficient documentation

## 2021-05-20 DIAGNOSIS — D171 Benign lipomatous neoplasm of skin and subcutaneous tissue of trunk: Secondary | ICD-10-CM | POA: Diagnosis not present

## 2021-05-20 DIAGNOSIS — I10 Essential (primary) hypertension: Secondary | ICD-10-CM | POA: Diagnosis not present

## 2021-05-20 DIAGNOSIS — Z833 Family history of diabetes mellitus: Secondary | ICD-10-CM | POA: Insufficient documentation

## 2021-05-20 DIAGNOSIS — Z8249 Family history of ischemic heart disease and other diseases of the circulatory system: Secondary | ICD-10-CM | POA: Diagnosis not present

## 2021-05-20 HISTORY — PX: LIPOMA EXCISION: SHX5283

## 2021-05-20 SURGERY — EXCISION LIPOMA
Anesthesia: Monitor Anesthesia Care | Site: Buttocks | Laterality: Left

## 2021-05-20 MED ORDER — ONDANSETRON HCL 4 MG/2ML IJ SOLN: 4.0000 mg | Freq: Once | INTRAMUSCULAR | Status: DC | PRN

## 2021-05-20 MED ORDER — CHLORHEXIDINE GLUCONATE 0.12 % MT SOLN
15.0000 mL | Freq: Once | OROMUCOSAL | Status: AC
  Administered 2021-05-20: 15 mL via OROMUCOSAL

## 2021-05-20 MED ORDER — ORAL CARE MOUTH RINSE: 15.0000 mL | Freq: Once | OROMUCOSAL | Status: AC

## 2021-05-20 MED ORDER — FENTANYL CITRATE (PF) 100 MCG/2ML IJ SOLN
INTRAMUSCULAR | Status: DC | PRN
  Administered 2021-05-20 (×2): 50 ug via INTRAVENOUS

## 2021-05-20 MED ORDER — ROPIVACAINE HCL 7.5 MG/ML IJ SOLN: INTRAMUSCULAR | Status: DC | PRN

## 2021-05-20 MED ORDER — SODIUM CHLORIDE 0.9 % IV SOLN
INTRAVENOUS | Status: DC | PRN
  Administered 2021-05-20: 40 ug via INTRAVENOUS
  Administered 2021-05-20 (×2): 80 ug via INTRAVENOUS
  Administered 2021-05-20: 40 ug via INTRAVENOUS
  Administered 2021-05-20: 80 ug via INTRAVENOUS
  Administered 2021-05-20 (×2): 40 ug via INTRAVENOUS

## 2021-05-20 MED ORDER — CEFAZOLIN SODIUM-DEXTROSE 2-4 GM/100ML-% IV SOLN
2.0000 g | INTRAVENOUS | Status: AC
  Administered 2021-05-20: 2 g via INTRAVENOUS
  Filled 2021-05-20: qty 100

## 2021-05-20 MED ORDER — MIDAZOLAM HCL 2 MG/2ML IJ SOLN
INTRAMUSCULAR | Status: AC
  Filled 2021-05-20: qty 2

## 2021-05-20 MED ORDER — SCOPOLAMINE 1 MG/3DAYS TD PT72
1.0000 | MEDICATED_PATCH | TRANSDERMAL | Status: DC
  Administered 2021-05-20: 1.5 mg via TRANSDERMAL
  Filled 2021-05-20: qty 1

## 2021-05-20 MED ORDER — FENTANYL CITRATE (PF) 100 MCG/2ML IJ SOLN: 25.0000 ug | INTRAMUSCULAR | Status: DC | PRN

## 2021-05-20 MED ORDER — EPHEDRINE 5 MG/ML INJ
INTRAVENOUS | Status: AC
  Filled 2021-05-20: qty 10

## 2021-05-20 MED ORDER — LIDOCAINE 2% (20 MG/ML) 5 ML SYRINGE
INTRAMUSCULAR | Status: AC
  Filled 2021-05-20: qty 5

## 2021-05-20 MED ORDER — BUPIVACAINE-EPINEPHRINE (PF) 0.25% -1:200000 IJ SOLN
INTRAMUSCULAR | Status: AC
  Filled 2021-05-20: qty 30

## 2021-05-20 MED ORDER — LIDOCAINE HCL (CARDIAC) PF 100 MG/5ML IV SOSY
PREFILLED_SYRINGE | INTRAVENOUS | Status: DC | PRN
  Administered 2021-05-20: 100 mg via INTRATRACHEAL

## 2021-05-20 MED ORDER — EPHEDRINE SULFATE 50 MG/ML IJ SOLN
INTRAMUSCULAR | Status: DC | PRN
  Administered 2021-05-20 (×2): 5 mg via INTRAVENOUS

## 2021-05-20 MED ORDER — FENTANYL CITRATE (PF) 100 MCG/2ML IJ SOLN
INTRAMUSCULAR | Status: AC
  Filled 2021-05-20: qty 2

## 2021-05-20 MED ORDER — ACETAMINOPHEN 500 MG PO TABS
1000.0000 mg | ORAL_TABLET | ORAL | Status: AC
  Administered 2021-05-20: 1000 mg via ORAL
  Filled 2021-05-20: qty 2

## 2021-05-20 MED ORDER — DEXMEDETOMIDINE (PRECEDEX) IN NS 20 MCG/5ML (4 MCG/ML) IV SYRINGE
PREFILLED_SYRINGE | INTRAVENOUS | Status: AC
  Filled 2021-05-20: qty 5

## 2021-05-20 MED ORDER — HYDROCODONE-ACETAMINOPHEN 5-325 MG PO TABS: 1.0000 | ORAL_TABLET | Freq: Four times a day (QID) | ORAL | 0 refills | Status: DC | PRN

## 2021-05-20 MED ORDER — CHLORHEXIDINE GLUCONATE CLOTH 2 % EX PADS: 6.0000 | MEDICATED_PAD | Freq: Once | CUTANEOUS | Status: DC

## 2021-05-20 MED ORDER — PROPOFOL 500 MG/50ML IV EMUL
INTRAVENOUS | Status: AC
  Filled 2021-05-20: qty 50

## 2021-05-20 MED ORDER — 0.9 % SODIUM CHLORIDE (POUR BTL) OPTIME
TOPICAL | Status: DC | PRN
  Administered 2021-05-20: 1000 mL

## 2021-05-20 MED ORDER — MIDAZOLAM HCL 5 MG/5ML IJ SOLN
INTRAMUSCULAR | Status: DC | PRN
  Administered 2021-05-20: 2 mg via INTRAVENOUS

## 2021-05-20 MED ORDER — OXYCODONE HCL 5 MG/5ML PO SOLN: 5.0000 mg | Freq: Once | ORAL | Status: DC | PRN

## 2021-05-20 MED ORDER — ONDANSETRON HCL 4 MG/2ML IJ SOLN
INTRAMUSCULAR | Status: DC | PRN
  Administered 2021-05-20: 4 mg via INTRAVENOUS

## 2021-05-20 MED ORDER — BUPIVACAINE-EPINEPHRINE (PF) 0.25% -1:200000 IJ SOLN
INTRAMUSCULAR | Status: DC | PRN
  Administered 2021-05-20: 10 mL

## 2021-05-20 MED ORDER — PHENYLEPHRINE 40 MCG/ML (10ML) SYRINGE FOR IV PUSH (FOR BLOOD PRESSURE SUPPORT)
PREFILLED_SYRINGE | INTRAVENOUS | Status: AC
  Filled 2021-05-20: qty 10

## 2021-05-20 MED ORDER — OXYCODONE HCL 5 MG PO TABS: 5.0000 mg | ORAL_TABLET | Freq: Once | ORAL | Status: DC | PRN

## 2021-05-20 MED ORDER — PROPOFOL 500 MG/50ML IV EMUL
INTRAVENOUS | Status: DC | PRN
  Administered 2021-05-20: 125 ug/kg/min via INTRAVENOUS

## 2021-05-20 MED ORDER — LACTATED RINGERS IV SOLN: INTRAVENOUS | Status: DC

## 2021-05-20 MED ORDER — ONDANSETRON HCL 4 MG/2ML IJ SOLN
INTRAMUSCULAR | Status: AC
  Filled 2021-05-20: qty 2

## 2021-05-20 SURGICAL SUPPLY — 36 items
ADH SKN CLS APL DERMABOND .7 (GAUZE/BANDAGES/DRESSINGS) ×1
APL SKNCLS STERI-STRIP NONHPOA (GAUZE/BANDAGES/DRESSINGS)
BENZOIN TINCTURE PRP APPL 2/3 (GAUZE/BANDAGES/DRESSINGS) IMPLANT
BLADE HEX COATED 2.75 (ELECTRODE) ×1 IMPLANT
BLADE SURG 15 STRL LF DISP TIS (BLADE) ×1 IMPLANT
BLADE SURG 15 STRL SS (BLADE) ×2
BLADE SURG SZ10 CARB STEEL (BLADE) ×1 IMPLANT
COVER SURGICAL LIGHT HANDLE (MISCELLANEOUS) ×2 IMPLANT
COVER WAND RF STERILE (DRAPES) IMPLANT
DECANTER SPIKE VIAL GLASS SM (MISCELLANEOUS) IMPLANT
DERMABOND ADVANCED (GAUZE/BANDAGES/DRESSINGS) ×1
DERMABOND ADVANCED .7 DNX12 (GAUZE/BANDAGES/DRESSINGS) IMPLANT
DRAPE LAPAROTOMY T 102X78X121 (DRAPES) IMPLANT
DRAPE LAPAROTOMY TRNSV 102X78 (DRAPES) ×1 IMPLANT
DRAPE SHEET LG 3/4 BI-LAMINATE (DRAPES) IMPLANT
ELECT REM PT RETURN 15FT ADLT (MISCELLANEOUS) ×2 IMPLANT
GAUZE SPONGE 4X4 12PLY STRL (GAUZE/BANDAGES/DRESSINGS) ×2 IMPLANT
GLOVE SURG ENC TEXT LTX SZ8 (GLOVE) ×2 IMPLANT
GLOVE SURG UNDER POLY LF SZ7 (GLOVE) ×2 IMPLANT
GOWN SPEC L4 XLG W/TWL (GOWN DISPOSABLE) ×2 IMPLANT
GOWN STRL REUS W/TWL LRG LVL3 (GOWN DISPOSABLE) ×2 IMPLANT
GOWN STRL REUS W/TWL XL LVL3 (GOWN DISPOSABLE) ×6 IMPLANT
KIT BASIN OR (CUSTOM PROCEDURE TRAY) ×2 IMPLANT
KIT TURNOVER KIT A (KITS) ×2 IMPLANT
MARKER SKIN DUAL TIP RULER LAB (MISCELLANEOUS) ×1 IMPLANT
NDL HYPO 25X1 1.5 SAFETY (NEEDLE) ×1 IMPLANT
NEEDLE HYPO 25X1 1.5 SAFETY (NEEDLE) ×2 IMPLANT
NS IRRIG 1000ML POUR BTL (IV SOLUTION) ×2 IMPLANT
PACK BASIC VI WITH GOWN DISP (CUSTOM PROCEDURE TRAY) ×2 IMPLANT
PENCIL SMOKE EVACUATOR (MISCELLANEOUS) IMPLANT
SPONGE LAP 18X18 RF (DISPOSABLE) IMPLANT
SPONGE LAP 4X18 RFD (DISPOSABLE) ×2 IMPLANT
STAPLER VISISTAT 35W (STAPLE) IMPLANT
SUT VIC AB 4-0 SH 18 (SUTURE) IMPLANT
SYR CONTROL 10ML LL (SYRINGE) ×2 IMPLANT
TAPE CLOTH SURG 4X10 WHT LF (GAUZE/BANDAGES/DRESSINGS) ×1 IMPLANT

## 2021-05-20 NOTE — Interval H&P Note (Signed)
History and Physical Interval Note:  05/20/2021 9:59 AM  Mindy Long  has presented today for surgery, with the diagnosis of LIPOMA OF BUTTOCK.  The various methods of treatment have been discussed with the patient and family. After consideration of risks, benefits and other options for treatment, the patient has consented to  Procedure(s): EXCISION LIPOMA OF LEFT BUTTOCK (Left) as a surgical intervention.  The patient's history has been reviewed, patient examined, no change in status, stable for surgery.  I have reviewed the patient's chart and labs.  Questions were answered to the patient's satisfaction.     [name]

## 2021-05-20 NOTE — Anesthesia Preprocedure Evaluation (Addendum)
Anesthesia Evaluation  Patient identified by MRN, date of birth, ID band Patient awake    Reviewed: Allergy & Precautions, NPO status , Patient's Chart, lab work & pertinent test results, reviewed documented beta blocker date and time   Airway Mallampati: II  TM Distance: >3 FB Neck ROM: Full    Dental no notable dental hx. (+) Teeth Intact, Dental Advisory Given   Pulmonary sleep apnea and Continuous Positive Airway Pressure Ventilation , Recent URI , Residual Cough,  Covid 19, 05/13/21 Cough, runny nose, chills Symptoms began on 05/08/21 tested negative    Pulmonary exam normal breath sounds clear to auscultation       Cardiovascular hypertension, Pt. on medications and Pt. on home beta blockers Normal cardiovascular exam Rhythm:Regular Rate:Normal     Neuro/Psych  Headaches, negative psych ROS   GI/Hepatic Neg liver ROS, PUD, GERD  Medicated and Controlled,  Endo/Other  negative endocrine ROS  Renal/GU Hx/o nephrolithiasis  negative genitourinary   Musculoskeletal Chronic back pain   Abdominal   Peds  Hematology negative hematology ROS (+)   Anesthesia Other Findings   Reproductive/Obstetrics                           Anesthesia Physical Anesthesia Plan  ASA: 2  Anesthesia Plan: MAC   Post-op Pain Management:    Induction: Intravenous  PONV Risk Score and Plan: 2 and Treatment may vary due to age or medical condition, Propofol infusion and Ondansetron  Airway Management Planned: Natural Airway and Simple Face Mask  Additional Equipment:   Intra-op Plan:   Post-operative Plan:   Informed Consent: I have reviewed the patients History and Physical, chart, labs and discussed the procedure including the risks, benefits and alternatives for the proposed anesthesia with the patient or authorized representative who has indicated his/her understanding and acceptance.     Dental advisory  given  Plan Discussed with: CRNA and Anesthesiologist  Anesthesia Plan Comments:         Anesthesia Quick Evaluation

## 2021-05-20 NOTE — Anesthesia Postprocedure Evaluation (Signed)
Anesthesia Post Note  Patient: Mindy Long  Procedure(s) Performed: EXCISION LIPOMA OF LEFT BUTTOCK (Left: Buttocks)     Patient location during evaluation: PACU Anesthesia Type: MAC Level of consciousness: awake and alert and oriented Pain management: pain level controlled Vital Signs Assessment: post-procedure vital signs reviewed and stable Respiratory status: spontaneous breathing, nonlabored ventilation and respiratory function stable Cardiovascular status: stable and blood pressure returned to baseline Postop Assessment: no apparent nausea or vomiting Anesthetic complications: no   No notable events documented.  Last Vitals:  Vitals:   05/20/21 0858 05/20/21 1102  BP: (!) 132/93 109/78  Pulse: 73 87  Resp: 18 (!) 21  Temp: 37 C (!) 35.8 C  SpO2: 100% 94%    Last Pain:  Vitals:   05/20/21 1102  TempSrc:   PainSc: Asleep                 Skylan Gift A.

## 2021-05-20 NOTE — Anesthesia Procedure Notes (Deleted)
Anesthesia Regional Block: Adductor canal block   Pre-Anesthetic Checklist: , timeout performed,  Correct Patient, Correct Site, Correct Laterality,  Correct Procedure, Correct Position, site marked,  Risks and benefits discussed,  Surgical consent,  Pre-op evaluation,  At surgeon's request and post-op pain management  Laterality: Right  Prep: chloraprep       Needles:  Injection technique: Single-shot  Needle Type: Echogenic Stimulator Needle     Needle Length: 10cm  Needle Gauge: 21   Needle insertion depth: 8 cm   Additional Needles:   Narrative:  Start time: 05/20/2021 9:55 AM End time: 05/20/2021 10:00 AM Injection made incrementally with aspirations every 5 mL.  Performed by: Personally  Anesthesiologist: Josephine Igo, MD  Additional Notes: Timeout performed. Patient sedated. Relevant anatomy ID'd using [place]. Incremental 2-3ml injection of LA with frequent aspiration. Patient tolerated procedure well.

## 2021-05-20 NOTE — Transfer of Care (Signed)
Immediate Anesthesia Transfer of Care Note  Patient: Mindy Long  Procedure(s) Performed: EXCISION LIPOMA OF LEFT BUTTOCK (Left: Buttocks)  Patient Location: PACU  Anesthesia Type:MAC  Level of Consciousness: awake, alert  and oriented  Airway & Oxygen Therapy: Patient Spontanous Breathing  Post-op Assessment: Report given to RN and Post -op Vital signs reviewed and stable  Post vital signs: Reviewed and stable  Last Vitals:  Vitals Value Taken Time  BP 109/78 05/20/21 1102  Temp    Pulse 87 05/20/21 1104  Resp 20 05/20/21 1104  SpO2 94 % 05/20/21 1104  Vitals shown include unvalidated device data.  Last Pain:  Vitals:   05/20/21 0858  TempSrc: Oral  PainSc: 0-No pain      Patients Stated Pain Goal: 3 (02/63/78 5885)  Complications: No notable events documented.

## 2021-05-20 NOTE — Op Note (Signed)
Arneshia Ade Weissman  [birth year]   05/20/2021    PCP:  Abner Greenspan, MD   Surgeon: Kaylyn Lim, MD, FACS  Asst:  none  Anes:  Local MAC  Preop Dx: Palpable mass if the left buttock Postop Dx: same  Procedure: Excision of buttock mass (1.7 cm) Location Surgery: [facility] Complications: none  EBL:   minimal cc  Drains: none  Description of Procedure:  The patient was taken to OR 1 .  After anesthesia was administered and the patient was prepped  with chloroprep  and a timeout was performed.  Preop she had been marked in multiple positions with an indelible marker.The area was infiltrated transversely and an ellipse of skin and the palpable mass was felt and later marked with a suture in the specimen. Blunt and sharp dissecton were used.  When removed no other masses were appreciated.    The wound was irrigated and closed in layers with 4-0 vicryl and three vertical mattress sutures of 4-0 nylon were placed.  Dermabond was placed between nylon sutures  The patient tolerated the procedure well and was taken to the PACU in stable condition.     Matt B. Hassell Done, [facility], [facility], [place]

## 2021-05-21 ENCOUNTER — Encounter (HOSPITAL_COMMUNITY): Payer: Self-pay | Admitting: Surgery

## 2021-05-21 LAB — SURGICAL PATHOLOGY

## 2021-05-23 ENCOUNTER — Other Ambulatory Visit: Payer: Self-pay | Admitting: Family Medicine

## 2021-05-23 DIAGNOSIS — Z1231 Encounter for screening mammogram for malignant neoplasm of breast: Secondary | ICD-10-CM

## 2021-06-20 ENCOUNTER — Other Ambulatory Visit: Payer: Self-pay | Admitting: Family Medicine

## 2021-06-25 ENCOUNTER — Other Ambulatory Visit: Payer: BC Managed Care – PPO

## 2021-07-01 ENCOUNTER — Encounter: Payer: Self-pay | Admitting: Physician Assistant

## 2021-07-01 ENCOUNTER — Ambulatory Visit (INDEPENDENT_AMBULATORY_CARE_PROVIDER_SITE_OTHER): Payer: BC Managed Care – PPO | Admitting: Physician Assistant

## 2021-07-01 ENCOUNTER — Other Ambulatory Visit: Payer: Self-pay

## 2021-07-01 VITALS — BP 110/82 | HR 82 | Ht 63.0 in | Wt 166.0 lb

## 2021-07-01 DIAGNOSIS — R0789 Other chest pain: Secondary | ICD-10-CM

## 2021-07-01 DIAGNOSIS — Z8616 Personal history of COVID-19: Secondary | ICD-10-CM

## 2021-07-01 DIAGNOSIS — G4733 Obstructive sleep apnea (adult) (pediatric): Secondary | ICD-10-CM

## 2021-07-01 DIAGNOSIS — R002 Palpitations: Secondary | ICD-10-CM

## 2021-07-01 DIAGNOSIS — I10 Essential (primary) hypertension: Secondary | ICD-10-CM | POA: Diagnosis not present

## 2021-07-01 DIAGNOSIS — K219 Gastro-esophageal reflux disease without esophagitis: Secondary | ICD-10-CM

## 2021-07-01 DIAGNOSIS — R9431 Abnormal electrocardiogram [ECG] [EKG]: Secondary | ICD-10-CM

## 2021-07-01 MED ORDER — ISOSORBIDE MONONITRATE ER 30 MG PO TB24: 30.0000 mg | ORAL_TABLET | Freq: Every day | ORAL | 5 refills | Status: DC

## 2021-07-01 NOTE — Patient Instructions (Signed)
Medication Instructions:   Your physician has recommended you make the following change in your medication:   START taking IMDUR 30 mg once a day.  *If you need a refill on your cardiac medications before your next appointment, please call your pharmacy*   Lab Work: None ordered If you have labs (blood work) drawn today and your tests are completely normal, you will receive your results only by: [facility] (if you have MyChart) OR A paper copy in the mail If you have any lab test that is abnormal or we need to change your treatment, we will call you to review the results.   Testing/Procedures: None ordered   Follow-Up: At [facility], you and your health needs are our priority.  As part of our continuing mission to provide you with exceptional heart care, we have created designated Provider Care Teams.  These Care Teams include your primary Cardiologist (physician) and Advanced Practice Providers (APPs -  Physician Assistants and Nurse Practitioners) who all work together to provide you with the care you need, when you need it.  We recommend signing up for the patient portal called "MyChart".  Sign up information is provided on this After Visit Summary.  MyChart is used to connect with patients for Virtual Visits (Telemedicine).  Patients are able to view lab/test results, encounter notes, upcoming appointments, etc.  Non-urgent messages can be sent to your provider as well.   To learn more about what you can do with MyChart, go to [email or web address].    Your next appointment:   3-4 month(s)  The format for your next appointment:   In Person  Provider:   You may see Dr. Saunders Revel or one of the following Advanced Practice Providers on your designated Care Team:   Murray Hodgkins, NP Christell Faith, PA-C Marrianne Mood, PA-C Cadence Kathlen Mody, [place]   Other Instructions

## 2021-07-01 NOTE — Progress Notes (Signed)
Office Visit    Patient Name: Mindy Long Date of Encounter: 07/01/2021  PCP:  Abner Greenspan, MD   [place]  Cardiologist:  Dr. Saunders Revel Advanced Practice Provider:  No care team member to display Electrophysiologist:  None [id number]}   Chief Complaint    Chief Complaint  Patient presents with   Follow-up    60 y.o. female with history of chest pain, palpitations, hypertension, sleep apnea, GERD, kidney stones, and seen today for follow-up.  Past Medical History    Past Medical History:  Diagnosis Date   Back pain    Chronic back pain    GERD (gastroesophageal reflux disease)    Headache(784.0)    History of nephrolithiasis    Hypertension    Plantar fasciitis    Sleep apnea    Ulcer 1980's   peptic ulcer disease   Past Surgical History:  Procedure Laterality Date   ABDOMINAL HYSTERECTOMY  2002   CHOLECYSTECTOMY  1984   COLONOSCOPY  12/18/2011   Normal   IR ANGIO EXTERNAL CAROTID SEL EXT CAROTID BILAT MOD SED  11/15/2018   IR ANGIO INTRA EXTRACRAN SEL INTERNAL CAROTID BILAT MOD SED  11/15/2018   IR ANGIO VERTEBRAL SEL VERTEBRAL UNI R MOD SED  11/15/2018   IR US GUIDE VASC ACCESS RIGHT  11/15/2018   LIPOMA EXCISION Left 05/20/2021   Procedure: EXCISION LIPOMA OF LEFT BUTTOCK;  Surgeon: Johnathan Hausen, MD;  Location: [facility];  Service: General;  Laterality: Left;   OOPHORECTOMY      Allergies  Allergies  Allergen Reactions   Atenolol Cough   Hydrocodone Nausea Only    History of Present Illness    Mindy Long is a 60 y.o. female with PMH as above.  S she initially established with Dr. Saunders Revel for palpitations and chest pain.  She has history of hypertension, sleep apnea, GERD, and kidney stones.  She presented to [facility]/17/2021 with palpitations after presenting to urgent care earlier that day.  She was referred to the ED for EKG abnormalities.  ED work-up was largely unrevealing.  She was discharged with Lopressor 12.5 mg twice  daily.  When seen 04/04/2021, she reported palpitations for about a year.  Palpitations were infrequent and improved by avoiding caffeine.  Over the last 3 weeks leading up to her visit, palpitations have been more frequent and persistent.  She noted that they improved with Lopressor 12.5 mg twice daily.  She had some mild coughing with metoprolol as she reported also occurred with atenolol in the past.  She decided to continue metoprolol.  Palpitations were described as a fluttering, most noticeable when she was in bed.  She especially noticed them with her left side down.  She often roll onto her right side to terminate the feeling of fluttering in her chest.  She noted some associated lightheadedness.  She also reported increasing chest pain.  She would feel a sharp pain in the center of her chest, sometimes needles, lasting for a moment and coming and going.  Given her symptoms, recommendation was for 14-day event monitor and echo.  If echo was unrevealing and pain did not improve, noninvasive ischemic testing would be recommended at follow-up. She reports that 1 week later, she started having chest pain that would come on all of a sudden - this was also described as sharp with palpitations.  Subsequent 04/2021 cardiac monitoring showed predominantly NSR with average rate 76 bpm.  She had rare PACs  and PVCs.  She also had 2 atrial runs/paroxysmal SVT, lasting up to 9.6 seconds and with maximum rate 184 bpm.  No sustained arrhythmia or prolonged pauses noted.  Patient triggered events corresponded to NSR and PACs.  Shortly after her visit, she tested herself for coronavirus with first 5/31 test negative.  Her husband then tested at [facility] and.  Her husband then tested at [facility] and was positive with patient report that the second test she did for COVID-19 was also positive.  She was seen by her PCP 6/7 and reported that her chills and malaise were improving.  She was still coughing a lot with some congestion.  She  was using Robitussin.  She reported fatigue.   On 6/14, she had excision of L buttock lipoma.   05/15/2021 echo with EF 55 to 60%, NRWMA.  Today, 07/01/2021, she returns to clinic and notes that she is feeling significantly improved when compared with her previous clinic visits.  She reports ongoing issues with GERD, described as a knot in her throat.  She continues to use Lopressor for palpitations with report that her palpitations have significantly decreased in frequency and intensity.  When her palpitations occur, they last only seconds.  She is monitoring her BP at home with SBP 1 30-1 32 and DBP 80.  Heart rate usually ranges between 70 and 80.  She continues to note that her palpitations are better when on her left side.  No presyncope or syncope.  She denies any significant symptoms of volume overload.  She continues to recover from COVID-19.  Home Medications   Current Outpatient Medications  Medication Instructions   amLODipine (NORVASC) 5 MG tablet TAKE 1 TABLET BY MOUTH EVERY DAY   benzonatate (TESSALON) 200 mg, Oral, 3 times daily PRN, Swallow whole, do not bite pill   celecoxib (CELEBREX) 200 mg, Oral, Daily PRN, With food   gabapentin (NEURONTIN) 300 mg, Oral, 3 times daily PRN   HYDROcodone-acetaminophen (NORCO/VICODIN) 5-325 MG tablet 1 tablet, Oral, Every 6 hours PRN   isosorbide mononitrate (IMDUR) 30 mg, Oral, Daily   metoprolol tartrate (LOPRESSOR) 25 MG tablet TAKE 1/2 TABLET BY MOUTH TWICE A DAY AS NEEDED (PALPITATIONS)   omeprazole (PRILOSEC) 20 mg, Oral, Daily PRN   promethazine-dextromethorphan (PROMETHAZINE-DM) 6.25-15 MG/5ML syrup 5 mLs, Oral, 4 times daily PRN, Caution of sedation   tiZANidine (ZANAFLEX) 4 mg, Oral, 3 times daily PRN   Vitamin D3 2,000 Units, Oral, Daily,       Review of Systems    She reports improved palpitations and CP on lopressor BID.Marland Kitchen She has sx of GERD and a knot in her throat. Since COVID19, she has slowly had improved dyspnea, pnd,  orthopnea. No n, v, dizziness, syncope, edema, weight gain, or early satiety. .   All other systems reviewed and are otherwise negative except as noted above.  Physical Exam    VS:  BP 110/82 (BP Location: Left Arm, Patient Position: Sitting, Cuff Size: Large)   Pulse 82   Ht '5\' 3"'$  (1.6 m)   Wt 166 lb (75.3 kg)   SpO2 98%   BMI 29.41 kg/m  , BMI Body mass index is 29.41 kg/m. GEN: Well nourished, well developed, in no acute distress. HEENT: normal. Neck: Supple, no JVD, carotid bruits, or masses. Cardiac: RRR, no murmurs, rubs, or gallops. No clubbing, cyanosis, edema.  Radials/DP/PT 2+ and equal bilaterally.  Respiratory:  Respirations regular and unlabored, clear to auscultation bilaterally. GI: Soft, nontender, nondistended, BS + x 4.  MS: no deformity or atrophy. Skin: warm and dry, no rash. Neuro:  Strength and sensation are intact. Psych: Normal affect.  Accessory Clinical Findings    ECG personally reviewed by me today - No ekg - no acute changes.  VITALS Reviewed today   Temp Readings from Last 3 Encounters:  05/20/21 97.7 F (36.5 C) (Oral)  05/08/21 98.4 F (36.9 C) (Oral)  04/25/21 (!) 96.9 F (36.1 C) (Temporal)   BP Readings from Last 3 Encounters:  07/01/21 110/82  05/20/21 (!) 141/85  05/08/21 132/85   Pulse Readings from Last 3 Encounters:  07/01/21 82  05/20/21 63  05/08/21 (!) 113    Wt Readings from Last 3 Encounters:  07/01/21 166 lb (75.3 kg)  05/20/21 162 lb (73.5 kg)  04/25/21 165 lb 8 oz (75.1 kg)     LABS  reviewed today   Lab Results  Component Value Date   WBC 4.8 05/08/2021   HGB 14.2 05/08/2021   HCT 45.8 05/08/2021   MCV 80.8 05/08/2021   PLT 220 05/08/2021   Lab Results  Component Value Date   CREATININE 0.81 05/08/2021   BUN 11 05/08/2021   NA 139 05/08/2021   K 3.4 (L) 05/08/2021   CL 105 05/08/2021   CO2 27 05/08/2021   Lab Results  Component Value Date   ALT 36 (H) 08/19/2020   AST 20 08/19/2020   ALKPHOS  103 08/19/2020   BILITOT 0.5 08/19/2020   Lab Results  Component Value Date   CHOL 223 (H) 08/19/2020   HDL 89.70 08/19/2020   LDLCALC 122 (H) 08/19/2020   LDLDIRECT 126.7 10/23/2011   TRIG 54.0 08/19/2020   CHOLHDL 2 08/19/2020    Lab Results  Component Value Date   HGBA1C 5.9 08/18/2017   Lab Results  Component Value Date   TSH 1.359 03/17/2021     STUDIES/PROCEDURES reviewed today   Echo 05/15/21  1. Left ventricular ejection fraction, by estimation, is 55 to 60%. The  left ventricle has normal function. The left ventricle has no regional  wall motion abnormalities. Left ventricular diastolic parameters were  normal.   2. Right ventricular systolic function is normal. The right ventricular  size is normal.   3. The mitral valve is normal in structure. No evidence of mitral valve  regurgitation.   4. The aortic valve is tricuspid. Aortic valve regurgitation is not  visualized.   5. The inferior vena cava is normal in size with greater than 50%  respiratory variability, suggesting right atrial pressure of 3 mmHg.   Zio 04/2021 The patient was monitored for 14 days. The predominant rhythm was sinus with an average rate of 76 bpm (range 49-124 bpm and sinus). There were rare PACs and PVCs. Two atrial runs lasting up to 9.6 seconds were observed with a maximum rate of 184 bpm. There was no sustained arrhythmia or prolonged pause. Patient triggered events correspond to sinus rhythm and PACs. Predominantly sinus rhythm with rare PACs and PVCs.  Two brief episodes of PSVT also noted.  Assessment & Plan    Palpitations --She notes significant improvement in palpitations on lopressor. Continue lopressor 12.'5mg'$  BID. Event monitor with pSVT and ectopy but no significant arrhythmia. Echo with no acute structural abnormalities. No EKG done today. Given her improved sx, will defer further workup at this time. Consider that palpitations could have been worse in the setting of  COVID19. Continue to avoid caffeine.   Chest pain, abnormal EKG --Reports atypical CP, improved from  last visit. She reports separate sx of GERD, described as a knot in her throat and occurring with food. Given this discomfort, will add Imdur '30mg'$  daily with recommendation she discontinue it if HA or BP low with dizziness. Echo with nl EF and NRWMA. Continue current BB with Imdur. If additional room in BP needed, recommend discontinue amlodipine. Given improved sx, we will defer from MPI or cCTA at this time. Consider that CP could have been worse given COVID19.   Hypertension --BP well controlled on BB. Adding Imdur for additional antianginal relief with recommendation she discontinue it if HA or dizziness with low BP. If additional room in BP needed on Imdur, recommend discontinue her amlodipine given she is also on a BB.  OSA --Continue CPAP.  Medication changes: Start Imdur '30mg'$  qd Labs ordered: None Studies / Imaging ordered: None Future considerations: cCTA or MPI at RTC if ongoing sx. Stop Imdur if HA or lightheadedness due to drop in BP (or could discontinue amlodipine for extra room in BP on Imdur and given already on BB). Disposition: RTC 3-4 months  *Please be aware that the above documentation was completed voice recognition software and may contain dictation errors.     Arvil Chaco, PA-C 07/01/2021

## 2021-07-17 ENCOUNTER — Other Ambulatory Visit: Payer: Self-pay

## 2021-07-17 ENCOUNTER — Ambulatory Visit
Admission: RE | Admit: 2021-07-17 | Discharge: 2021-07-17 | Disposition: A | Discharge status: Home / Self Care | Payer: BC Managed Care – PPO | Source: Ambulatory Visit | Attending: Family Medicine | Admitting: Family Medicine

## 2021-07-17 DIAGNOSIS — Z1231 Encounter for screening mammogram for malignant neoplasm of breast: Secondary | ICD-10-CM | POA: Diagnosis not present

## 2021-08-26 ENCOUNTER — Telehealth: Payer: Self-pay | Admitting: Family Medicine

## 2021-08-26 DIAGNOSIS — E559 Vitamin D deficiency, unspecified: Secondary | ICD-10-CM

## 2021-08-26 DIAGNOSIS — I10 Essential (primary) hypertension: Secondary | ICD-10-CM

## 2021-08-26 NOTE — Telephone Encounter (Signed)
-----   Message from Ellamae Sia sent at 08/12/2021 11:31 AM EDT ----- Regarding: Lab orders for Wednesday, 9.21.22 Patient is scheduled for CPX labs, please order future labs, Thanks , [name]

## 2021-08-27 ENCOUNTER — Other Ambulatory Visit: Payer: BC Managed Care – PPO

## 2021-09-05 ENCOUNTER — Encounter: Payer: BC Managed Care – PPO | Admitting: Family Medicine

## 2021-09-18 ENCOUNTER — Other Ambulatory Visit: Payer: Self-pay

## 2021-09-18 ENCOUNTER — Other Ambulatory Visit (INDEPENDENT_AMBULATORY_CARE_PROVIDER_SITE_OTHER): Payer: BC Managed Care – PPO

## 2021-09-18 DIAGNOSIS — I10 Essential (primary) hypertension: Secondary | ICD-10-CM

## 2021-09-18 DIAGNOSIS — E559 Vitamin D deficiency, unspecified: Secondary | ICD-10-CM

## 2021-09-18 LAB — LIPID PANEL
Cholesterol: 213 mg/dL — ABNORMAL HIGH (ref 0–200)
HDL: 83 mg/dL (ref >39.00)
LDL Cholesterol: 117 mg/dL — ABNORMAL HIGH (ref 0–99)
NonHDL: 129.99
Total CHOL/HDL Ratio: 3
Triglycerides: 66 mg/dL (ref 0.0–149.0)
VLDL: 13.2 mg/dL (ref 0.0–40.0)

## 2021-09-18 LAB — CBC WITH DIFFERENTIAL/PLATELET
Basophils Absolute: 0 10*3/uL (ref 0.0–0.1)
Basophils Relative: 1 % (ref 0.0–3.0)
Eosinophils Absolute: 0.1 10*3/uL (ref 0.0–0.7)
Eosinophils Relative: 2.3 % (ref 0.0–5.0)
HCT: 40.9 % (ref 36.0–46.0)
Hemoglobin: 13.1 g/dL (ref 12.0–15.0)
Lymphocytes Relative: 48.9 % — ABNORMAL HIGH (ref 12.0–46.0)
Lymphs Abs: 2.2 10*3/uL (ref 0.7–4.0)
MCHC: 32 g/dL (ref 30.0–36.0)
MCV: 77.4 fl — ABNORMAL LOW (ref 78.0–100.0)
Monocytes Absolute: 0.3 10*3/uL (ref 0.1–1.0)
Monocytes Relative: 6.6 % (ref 3.0–12.0)
Neutro Abs: 1.9 10*3/uL (ref 1.4–7.7)
Neutrophils Relative %: 41.2 % — ABNORMAL LOW (ref 43.0–77.0)
Platelets: 225 10*3/uL (ref 150.0–400.0)
RBC: 5.28 Mil/uL — ABNORMAL HIGH (ref 3.87–5.11)
RDW: 14.3 % (ref 11.5–15.5)
WBC: 4.5 10*3/uL (ref 4.0–10.5)

## 2021-09-18 LAB — COMPREHENSIVE METABOLIC PANEL
ALT: 11 U/L (ref 0–35)
AST: 17 U/L (ref 0–37)
Albumin: 4 g/dL (ref 3.5–5.2)
Alkaline Phosphatase: 91 U/L (ref 39–117)
BUN: 15 mg/dL (ref 6–23)
CO2: 26 mEq/L (ref 19–32)
Calcium: 9.2 mg/dL (ref 8.4–10.5)
Chloride: 105 mEq/L (ref 96–112)
Creatinine, Ser: 0.77 mg/dL (ref 0.40–1.20)
GFR: 82.73 mL/min (ref >60.00)
Glucose, Bld: 90 mg/dL (ref 70–99)
Potassium: 3.6 mEq/L (ref 3.5–5.1)
Sodium: 139 mEq/L (ref 135–145)
Total Bilirubin: 0.5 mg/dL (ref 0.2–1.2)
Total Protein: 7 g/dL (ref 6.0–8.3)

## 2021-09-18 LAB — TSH: TSH: 1.32 u[IU]/mL (ref 0.35–5.50)

## 2021-09-18 LAB — VITAMIN D 25 HYDROXY (VIT D DEFICIENCY, FRACTURES): VITD: 34.39 ng/mL (ref 30.00–100.00)

## 2021-09-21 ENCOUNTER — Other Ambulatory Visit: Payer: Self-pay | Admitting: Family Medicine

## 2021-09-22 NOTE — Telephone Encounter (Signed)
Pt has CPE tomorrow, will hold until appt

## 2021-09-23 ENCOUNTER — Ambulatory Visit (INDEPENDENT_AMBULATORY_CARE_PROVIDER_SITE_OTHER): Payer: BC Managed Care – PPO | Admitting: Family Medicine

## 2021-09-23 ENCOUNTER — Other Ambulatory Visit: Payer: Self-pay

## 2021-09-23 ENCOUNTER — Encounter: Payer: Self-pay | Admitting: Family Medicine

## 2021-09-23 VITALS — BP 132/78 | HR 75 | Temp 97.9°F | Resp 12 | Ht 62.0 in | Wt 173.1 lb

## 2021-09-23 DIAGNOSIS — I10 Essential (primary) hypertension: Secondary | ICD-10-CM

## 2021-09-23 DIAGNOSIS — E559 Vitamin D deficiency, unspecified: Secondary | ICD-10-CM | POA: Diagnosis not present

## 2021-09-23 DIAGNOSIS — Z23 Encounter for immunization: Secondary | ICD-10-CM | POA: Diagnosis not present

## 2021-09-23 DIAGNOSIS — R21 Rash and other nonspecific skin eruption: Secondary | ICD-10-CM | POA: Insufficient documentation

## 2021-09-23 DIAGNOSIS — R002 Palpitations: Secondary | ICD-10-CM

## 2021-09-23 DIAGNOSIS — Z1211 Encounter for screening for malignant neoplasm of colon: Secondary | ICD-10-CM | POA: Diagnosis not present

## 2021-09-23 DIAGNOSIS — Z0000 Encounter for general adult medical examination without abnormal findings: Secondary | ICD-10-CM | POA: Diagnosis not present

## 2021-09-23 DIAGNOSIS — E041 Nontoxic single thyroid nodule: Secondary | ICD-10-CM

## 2021-09-23 MED ORDER — DICLOFENAC SODIUM 1 % EX GEL: 2.0000 g | Freq: Four times a day (QID) | CUTANEOUS | 11 refills | Status: DC | PRN

## 2021-09-23 MED ORDER — KETOCONAZOLE 2 % EX CREA: 1.0000 "application " | TOPICAL_CREAM | Freq: Every day | CUTANEOUS | 1 refills | Status: DC

## 2021-09-23 MED ORDER — AMLODIPINE BESYLATE 5 MG PO TABS: 5.0000 mg | ORAL_TABLET | Freq: Every day | ORAL | 3 refills | Status: DC

## 2021-09-23 NOTE — Assessment & Plan Note (Signed)
Referral done for screening colonoscopy due in january

## 2021-09-23 NOTE — Assessment & Plan Note (Signed)
D level today  Disc imp to bone and overall health   

## 2021-09-23 NOTE — Assessment & Plan Note (Signed)
Ongoing/chronic Was watched by ENT in the past and checked with [place] yearly  She is overdue and would like [place] to schedule that

## 2021-09-23 NOTE — Assessment & Plan Note (Signed)
Right foot only  Scale/peeling skin w/o maceration Pt denies itching  Resembles tinea -will treat with ketoconazole daily  Keep clean and dry  If no imp in 2 wk consider derm eval

## 2021-09-23 NOTE — Assessment & Plan Note (Signed)
Continues cardiology f/u  occ chest discomfort On metoprolol   Tried imdur, gave her a headache

## 2021-09-23 NOTE — Assessment & Plan Note (Signed)
bp in fair control at this time  BP Readings from Last 1 Encounters:  09/23/21 132/78   No changes needed Most recent labs reviewed  Disc lifstyle change with low sodium diet and exercise  Plan to continue metoprolol 25 mg 1/2 pill bid Was prev on imdur but had side eff, has stopped and will let cardiology know

## 2021-09-23 NOTE — Progress Notes (Signed)
Subjective:    Patient ID: Mindy Long, female    DOB: [birth year], 60 y.o.   MRN: [id number]  This visit occurred during the SARS-CoV-2 public health emergency.  Safety protocols were in place, including screening questions prior to the visit, additional usage of staff PPE, and extensive cleaning of exam room while observing appropriate contact time as indicated for disinfecting solutions.   HPI Here for health maintenance exam and to review chronic medical problems    Wt Readings from Last 3 Encounters:  09/23/21 173 lb 2 oz (78.5 kg)  07/01/21 166 lb (75.3 kg)  05/20/21 162 lb (73.5 kg)   31.66 kg/m Eats well  [facility]   [facility] there   Has a rash on her feet -not itchy  Used some cortisone cream   Zoster status-interested shingrix  Flu shot- will get today  Covid immunized Tdap 9/20  Colonoscopy 1/13 Wants to plan that   Mammogram 8/22 Self breast exam-no lumps   Gyn care -has had a hysterectomy   Supplements - vitamin D 3  No fractures  Level is 34.3    HTN bp is stable today  No cp or palpitations or headaches or edema  No side effects to medicines  BP Readings from Last 3 Encounters:  09/23/21 132/78  07/01/21 110/82  05/20/21 (!) 141/85     Palpitations are better  Seeing cardiology  Was taking isosorbide - caused headaches   HTN runs in the family   Metooprolol 12.5 mg  Amlodipine 5 mg daily   Pulse Readings from Last 3 Encounters:  09/23/21 75  07/01/21 82  05/20/21 63    Vit D deficiency-taking her D3  Labs Lab Results  Component Value Date   CHOL 213 (H) 09/18/2021   CHOL 223 (H) 08/19/2020   CHOL 214 (H) 08/18/2019   Lab Results  Component Value Date   HDL 83.00 09/18/2021   HDL 89.70 08/19/2020   HDL 90.40 08/18/2019   Lab Results  Component Value Date   LDLCALC 117 (H) 09/18/2021   LDLCALC 122 (H) 08/19/2020   LDLCALC 103 (H) 08/18/2019   Lab Results  Component Value Date   TRIG 66.0  09/18/2021   TRIG 54.0 08/19/2020   TRIG 101.0 08/18/2019   Lab Results  Component Value Date   CHOLHDL 3 09/18/2021   CHOLHDL 2 08/19/2020   CHOLHDL 2 08/18/2019   Lab Results  Component Value Date   LDLDIRECT 126.7 10/23/2011   LDLDIRECT 132.9 06/20/2009   LDLDIRECT 109.4 01/25/2008   Makes an effort to eat healthy   Glucose 90  Lab Results  Component Value Date   CREATININE 0.77 09/18/2021   BUN 15 09/18/2021   NA 139 09/18/2021   K 3.6 09/18/2021   CL 105 09/18/2021   CO2 26 09/18/2021   Lab Results  Component Value Date   ALT 11 09/18/2021   AST 17 09/18/2021   ALKPHOS 91 09/18/2021   BILITOT 0.5 09/18/2021    Lab Results  Component Value Date   WBC 4.5 09/18/2021   HGB 13.1 09/18/2021   HCT 40.9 09/18/2021   MCV 77.4 (L) 09/18/2021   PLT 225.0 09/18/2021   Lab Results  Component Value Date   TSH 1.32 09/18/2021    Uses celebrex for foot pain with walking  Tizanadine for back    Has a cyst that comes up over and over  Uses heat - this helps  Outside of vagina  Patient Active Problem List   Diagnosis Date Noted   Thyroid nodule 09/23/2021   Rash of foot 09/23/2021   Chest pain 04/01/2021   Localized skin mass, lump, or swelling 10/28/2020   Cyst of skin of breast 09/25/2019   Abnormal serum ACE level 04/08/2018   Eye exam abnormal 08/04/2017   Tinnitus 11/13/2014   History of shingles 04/27/2014   Low back pain 04/20/2014   OSA (obstructive sleep apnea) 09/27/2012   Snoring 06/21/2012   Fatigue 06/20/2012   Colon cancer screening 11/04/2011   Routine general medical examination at a health care facility 10/22/2011   Vitamin D deficiency 07/02/2010   Palpitations 04/07/2010   HYPOKALEMIA 07/10/2009   Allergic rhinitis 04/03/2008   Essential hypertension 01/25/2008   G E R D 07/05/2007   H/O peptic ulcer 07/05/2007   Headache, chronic daily 07/05/2007   NEPHROLITHIASIS, HX OF 07/05/2007   Past Medical History:  Diagnosis Date    Back pain    Chronic back pain    GERD (gastroesophageal reflux disease)    Headache(784.0)    History of nephrolithiasis    Hypertension    Plantar fasciitis    Sleep apnea    Ulcer 1980's   peptic ulcer disease   Past Surgical History:  Procedure Laterality Date   ABDOMINAL HYSTERECTOMY  2002   CHOLECYSTECTOMY  1984   COLONOSCOPY  12/18/2011   Normal   IR ANGIO EXTERNAL CAROTID SEL EXT CAROTID BILAT MOD SED  11/15/2018   IR ANGIO INTRA EXTRACRAN SEL INTERNAL CAROTID BILAT MOD SED  11/15/2018   IR ANGIO VERTEBRAL SEL VERTEBRAL UNI R MOD SED  11/15/2018   IR US GUIDE VASC ACCESS RIGHT  11/15/2018   LIPOMA EXCISION Left 05/20/2021   Procedure: EXCISION LIPOMA OF LEFT BUTTOCK;  Surgeon: Johnathan Hausen, MD;  Location: [facility];  Service: General;  Laterality: Left;   OOPHORECTOMY     Social History   Tobacco Use   Smoking status: Never   Smokeless tobacco: Never  Vaping Use   Vaping Use: Never used  Substance Use Topics   Alcohol use: Yes    Comment: occas.   Drug use: No   Family History  Problem Relation Age of Onset   Stroke Father    Hypertension Father    Diabetes Father    Cancer Brother        stomach tumor   Allergies  Allergen Reactions   Atenolol Cough   Hydrocodone Nausea Only   Current Outpatient Medications on File Prior to Visit  Medication Sig Dispense Refill   Cholecalciferol (VITAMIN D3) 2000 UNITS capsule Take 2,000 Units by mouth daily.     gabapentin (NEURONTIN) 300 MG capsule Take 300 mg by mouth 3 (three) times daily as needed (pain).     HYDROcodone-acetaminophen (NORCO/VICODIN) 5-325 MG tablet Take 1 tablet by mouth every 6 (six) hours as needed for moderate pain. 15 tablet 0   isosorbide mononitrate (IMDUR) 30 MG 24 hr tablet Take 1 tablet (30 mg total) by mouth daily. 30 tablet 5   omeprazole (PRILOSEC) 20 MG capsule TAKE 1 CAPSULE (20 MG TOTAL) BY MOUTH DAILY AS NEEDED. 270 capsule 1   tiZANidine (ZANAFLEX) 4 MG capsule Take 4 mg by mouth  3 (three) times daily as needed for muscle spasms.     metoprolol tartrate (LOPRESSOR) 25 MG tablet TAKE 1/2 TABLET BY MOUTH TWICE A DAY AS NEEDED (PALPITATIONS) 90 tablet 1   No current facility-administered medications on file prior to  visit.    Review of Systems  Constitutional:  Negative for activity change, appetite change, fatigue, fever and unexpected weight change.  HENT:  Negative for congestion, ear pain, rhinorrhea, sinus pressure and sore throat.   Eyes:  Negative for pain, redness and visual disturbance.  Respiratory:  Negative for cough, shortness of breath and wheezing.   Cardiovascular:  Negative for chest pain and palpitations.  Gastrointestinal:  Negative for abdominal pain, blood in stool, constipation and diarrhea.  Endocrine: Negative for polydipsia and polyuria.  Genitourinary:  Negative for dysuria, frequency and urgency.       Vaginal cyst comes and goes  Not here today  Musculoskeletal:  Negative for arthralgias, back pain and myalgias.  Skin:  Positive for rash. Negative for pallor.  Allergic/Immunologic: Negative for environmental allergies.  Neurological:  Negative for dizziness, syncope and headaches.  Hematological:  Negative for adenopathy. Does not bruise/bleed easily.  Psychiatric/Behavioral:  Negative for decreased concentration and dysphoric mood. The patient is not nervous/anxious.       Objective:   Physical Exam Constitutional:      General: She is not in acute distress.    Appearance: Normal appearance. She is well-developed. She is obese. She is not ill-appearing or diaphoretic.  HENT:     Head: Normocephalic and atraumatic.     Right Ear: Tympanic membrane, ear canal and external ear normal.     Left Ear: Tympanic membrane, ear canal and external ear normal.     Nose: Nose normal. No congestion.     Mouth/Throat:     Mouth: Mucous membranes are moist.     Pharynx: Oropharynx is clear. No posterior oropharyngeal erythema.  Eyes:      General: No scleral icterus.    Extraocular Movements: Extraocular movements intact.     Conjunctiva/sclera: Conjunctivae normal.     Pupils: Pupils are equal, round, and reactive to light.  Neck:     Thyroid: No thyromegaly.     Vascular: No carotid bruit or JVD.     Comments: Thyroid is slt bigger on the R Cardiovascular:     Rate and Rhythm: Normal rate and regular rhythm.     Pulses: Normal pulses.     Heart sounds: Normal heart sounds.    No gallop.  Pulmonary:     Effort: Pulmonary effort is normal. No respiratory distress.     Breath sounds: Normal breath sounds. No wheezing or rales.     Comments: Good air exch Chest:     Chest wall: Tenderness present.  Abdominal:     General: Bowel sounds are normal. There is no distension or abdominal bruit.     Palpations: Abdomen is soft. There is no mass.     Tenderness: There is no abdominal tenderness.     Hernia: No hernia is present.  Genitourinary:    Comments: Breast exam: No mass, nodules, thickening, tenderness, bulging, retraction, inflamation, nipple discharge or skin changes noted.  No axillary or clavicular LA.     Musculoskeletal:        General: No tenderness. Normal range of motion.     Cervical back: Normal range of motion and neck supple. No rigidity. No muscular tenderness.     Right lower leg: No edema.     Left lower leg: No edema.  Lymphadenopathy:     Cervical: No cervical adenopathy.  Skin:    General: Skin is warm and dry.     Coloration: Skin is not pale.  Findings: No bruising, erythema, lesion or rash.     Comments: Skin on R foot (dorsal and sole) has areas of dryness/peeling  No maceration between toes  No excoriation   L foot- mild dry skin     Neurological:     Mental Status: She is alert. Mental status is at baseline.     Cranial Nerves: No cranial nerve deficit.     Motor: No abnormal muscle tone.     Coordination: Coordination normal.     Gait: Gait normal.     Deep Tendon Reflexes:  Reflexes are normal and symmetric. Reflexes normal.  Psychiatric:        Mood and Affect: Mood normal.        Cognition and Memory: Memory normal.          Assessment & Plan:   Problem List Items Addressed This Visit       Cardiovascular and Mediastinum   Essential hypertension    bp in fair control at this time  BP Readings from Last 1 Encounters:  09/23/21 132/78  No changes needed Most recent labs reviewed  Disc lifstyle change with low sodium diet and exercise  Plan to continue metoprolol 25 mg 1/2 pill bid Was prev on imdur but had side eff, has stopped and will let cardiology know        Relevant Medications   amLODipine (NORVASC) 5 MG tablet     Endocrine   Thyroid nodule    Ongoing/chronic Was watched by ENT in the past and checked with [place] yearly  She is overdue and would like [place] to schedule that       Relevant Orders   US THYROID     Other   Vitamin D deficiency    D level today  Disc imp to bone and overall health      Palpitations    Continues cardiology f/u  occ chest discomfort On metoprolol   Tried imdur, gave her a headache      Routine general medical examination at a health care facility - Primary    Reviewed health habits including diet and exercise and skin cancer prevention Reviewed appropriate screening tests for age  Also reviewed health mt list, fam hx and immunization status , as well as social and family history   See HPI Labs reviewed  Commended for exercise  Plans to get shingrix when we are back at home office Flu shot given today  Colonoscopy ref done for January Mammogram utd  Gyn care utd/had a hysterectomy LDL cholestero is mildly imiproved      Colon cancer screening    Referral done for screening colonoscopy due in january      Relevant Orders   Ambulatory referral to Gastroenterology   Other Visit Diagnoses     Need for influenza vaccination       Relevant Orders   Flu Vaccine QUAD 6+ mos PF IM  (Fluarix Quad PF) (Completed)

## 2021-09-23 NOTE — Patient Instructions (Addendum)
Flu shot today   We will be back at the old building hopefully January fist  Call closer to then and make a nurse appt for the shingles shot    You will get a call to plan your colonoscopy in January or later   Also for thyroid ultrasound   Keep using heat when you get a cyst -warm compress or warm bath  Avoid a lot of vaginal products, just flush with water  Wear cotton underwear   Use the ketoconazole cream for foot rash daily (may be athlete's foot)  If not improved in 2 weeks let me know   Try the voltaren gel instead of celebrex for pain

## 2021-09-23 NOTE — Assessment & Plan Note (Signed)
Reviewed health habits including diet and exercise and skin cancer prevention Reviewed appropriate screening tests for age  Also reviewed health mt list, fam hx and immunization status , as well as social and family history   See HPI Labs reviewed  Commended for exercise  Plans to get shingrix when we are back at home office Flu shot given today  Colonoscopy ref done for January Mammogram utd  Gyn care utd/had a hysterectomy LDL cholestero is mildly imiproved

## 2021-09-30 ENCOUNTER — Telehealth: Payer: Self-pay

## 2021-09-30 NOTE — Telephone Encounter (Signed)
Pt. Calling to schedule recall colnoscopy due in 12/2021

## 2021-10-01 ENCOUNTER — Ambulatory Visit (INDEPENDENT_AMBULATORY_CARE_PROVIDER_SITE_OTHER): Payer: BC Managed Care – PPO | Admitting: Internal Medicine

## 2021-10-01 ENCOUNTER — Other Ambulatory Visit: Payer: Self-pay

## 2021-10-01 ENCOUNTER — Encounter: Payer: Self-pay | Admitting: Internal Medicine

## 2021-10-01 VITALS — BP 120/90 | HR 78 | Ht 63.0 in | Wt 173.0 lb

## 2021-10-01 DIAGNOSIS — I471 Supraventricular tachycardia, unspecified: Secondary | ICD-10-CM

## 2021-10-01 DIAGNOSIS — I10 Essential (primary) hypertension: Secondary | ICD-10-CM | POA: Diagnosis not present

## 2021-10-01 DIAGNOSIS — R002 Palpitations: Secondary | ICD-10-CM | POA: Diagnosis not present

## 2021-10-01 DIAGNOSIS — R072 Precordial pain: Secondary | ICD-10-CM | POA: Diagnosis not present

## 2021-10-01 MED ORDER — METOPROLOL TARTRATE 100 MG PO TABS: 100.0000 mg | ORAL_TABLET | Freq: Once | ORAL | 0 refills | Status: DC

## 2021-10-01 MED ORDER — METOPROLOL TARTRATE 25 MG PO TABS: 12.5000 mg | ORAL_TABLET | Freq: Two times a day (BID) | ORAL | 1 refills | Status: DC

## 2021-10-01 NOTE — Patient Instructions (Addendum)
Medication Instructions:   Your physician has recommended you make the following change in your medication:   STOP Imdur (Isosorbide mononitrate)  *If you need a refill on your cardiac medications before your next appointment, please call your pharmacy*   Lab Work:  None ordered  Testing/Procedures:  Your cardiac CT has been scheduled Thursday 10/23/21 at 12:30 PM at the below location:  [facility] [place], [place] [address] [phone]  Please arrive 15 mins early for check-in and test prep.  Please follow these instructions carefully (unless otherwise directed):   On the Night Before the Test: Be sure to Drink plenty of water. Do not consume any caffeinated/decaffeinated beverages or chocolate 12 hours prior to your test. Do not take any antihistamines 12 hours prior to your test.   On the Day of the Test: Drink plenty of water until 1 hour prior to the test. Do not eat any food 4 hours prior to the test. You may take your regular medications prior to the test.  Take metoprolol (Lopressor) two hours prior to test. FEMALES- please wear underwire-free bra if available, avoid dresses & tight clothing       After the Test: Drink plenty of water. After receiving IV contrast, you may experience a mild flushed feeling. This is normal. On occasion, you may experience a mild rash up to 24 hours after the test. This is not dangerous. If this occurs, you can take Benadryl 25 mg and increase your fluid intake. If you experience trouble breathing, this can be serious. If it is severe call 911 IMMEDIATELY. If it is mild, please call our office. If you take any of these medications: Glipizide/Metformin, Avandament, Glucavance, please do not take 48 hours after completing test unless otherwise instructed.  Please allow 2-4 weeks for scheduling of routine cardiac CTs. Some insurance companies require a pre-authorization  which may delay scheduling of this test.   For non-scheduling related questions, please contact the cardiac imaging nurse navigator should you have any questions/concerns: Marchia Bond, Cardiac Imaging Nurse Navigator Gordy Clement, Cardiac Imaging Nurse Navigator [facility] Direct Office Dial: [phone]   For scheduling needs, including cancellations and rescheduling, please call [place], [phone].    Follow-Up: At [facility], you and your health needs are our priority.  As part of our continuing mission to provide you with exceptional heart care, we have created designated Provider Care Teams.  These Care Teams include your primary Cardiologist (physician) and Advanced Practice Providers (APPs -  Physician Assistants and Nurse Practitioners) who all work together to provide you with the care you need, when you need it.  We recommend signing up for the patient portal called "MyChart".  Sign up information is provided on this After Visit Summary.  MyChart is used to connect with patients for Virtual Visits (Telemedicine).  Patients are able to view lab/test results, encounter notes, upcoming appointments, etc.  Non-urgent messages can be sent to your provider as well.   To learn more about what you can do with MyChart, go to [email or web address].    Your next appointment:   1 year(s)  The format for your next appointment:   In Person  Provider:   You may see Dr. Harrell Gave End or one of the following Advanced Practice Providers on your designated Care Team:   Murray Hodgkins, NP Christell Faith, PA-C Marrianne Mood, PA-C Cadence Angoon, [place]

## 2021-10-01 NOTE — Progress Notes (Signed)
Follow-up Outpatient Visit Date: 10/01/2021  Primary Care Provider: Abner Greenspan, MD [place] [address]  Chief Complaint: Chest pain  HPI:  Mindy Long is a 60 y.o. female with history of palpitations, chest pain, hypertension, sleep apnea, GERD, and kidney stones, who presents for follow-up of palpitations.  She was last seen in our office in late July by Mindy Mood, PA, at which time she was feeling well, reporting improvement in palpitations with as needed metoprolol use.  Due to atypical chest pain, she was started on isosorbide mononitrate without plans for ischemia evaluation.  Today, Mindy Long reports she is feeling fairly well though she still has episodic chest pain.  It happens every few weeks to months and is described as a needlelike sensation along the left side of her chest.  It most often occurs when she is lying in bed on her left side.  She has not noticed any exertional chest pain or shortness of breath.  Palpitations have been minimal, as she is taking metoprolol tartrate 12.5 mg twice daily.  She is no longer taking isosorbide mononitrate due to intolerable headaches.  She notes that her home blood pressures are typically normal.  She has started exercising at the [facility] again and is also trying to walk outside from time to time.  She also complains of continued pulsatile tinnitus on the right side, which has been evaluated extensively in the past.  --------------------------------------------------------------------------------------------------  Cardiovascular History & Procedures: Cardiovascular Problems: Palpitations Chest pain   Risk Factors: Hypertension   Cath/PCI: None   CV Surgery: None   EP Procedures and Devices: 14-day event monitor (04/04/2021): Predominantly sinus rhythm with rare PAC's and PVC's.  Two brief episodes of PSVT also noted (lasting up to 9.6 seconds).   Non-Invasive Evaluation(s): TTE (05/15/2021):  Normal LV size and wall thickness.  LVEF 55-60% with normal diastolic function.  Normal RV size and function.  Mild mitral regurgitation.  Normal CVP. Carotid Doppler (08/21/2016): No significant carotid atherosclerosis.  Antegrade vertebral artery flow bilaterally.  Recent CV Pertinent Labs: Lab Results  Component Value Date   CHOL 213 (H) 09/18/2021   HDL 83.00 09/18/2021   LDLCALC 117 (H) 09/18/2021   LDLDIRECT 126.7 10/23/2011   TRIG 66.0 09/18/2021   CHOLHDL 3 09/18/2021   INR 0.94 11/15/2018   K 3.6 09/18/2021   MG 2.2 03/17/2021   BUN 15 09/18/2021   CREATININE 0.77 09/18/2021    Past medical and surgical history were reviewed and updated in EPIC.  Current Meds  Medication Sig   amLODipine (NORVASC) 5 MG tablet Take 1 tablet (5 mg total) by mouth daily.   Cholecalciferol (VITAMIN D3) 2000 UNITS capsule Take 2,000 Units by mouth daily.   diclofenac Sodium (VOLTAREN) 1 % GEL Apply 2 g topically 4 (four) times daily as needed.   gabapentin (NEURONTIN) 300 MG capsule Take 300 mg by mouth 3 (three) times daily as needed (pain).   ketoconazole (NIZORAL) 2 % cream Apply 1 application topically daily. To affected area/ foot   metoprolol tartrate (LOPRESSOR) 25 MG tablet TAKE 1/2 TABLET BY MOUTH TWICE A DAY AS NEEDED (PALPITATIONS) (Patient taking differently: Take 12.5 mg by mouth 2 (two) times daily.)   omeprazole (PRILOSEC) 20 MG capsule TAKE 1 CAPSULE (20 MG TOTAL) BY MOUTH DAILY AS NEEDED.   tiZANidine (ZANAFLEX) 4 MG capsule Take 4 mg by mouth 3 (three) times daily as needed for muscle spasms.    Allergies: Atenolol and Hydrocodone  Social History   Tobacco Use   Smoking status: Never   Smokeless tobacco: Never  Vaping Use   Vaping Use: Never used  Substance Use Topics   Alcohol use: Yes    Comment: occas.   Drug use: No    Family History  Problem Relation Age of Onset   Stroke Father    Hypertension Father    Diabetes Father    Cancer Brother        stomach  tumor    Review of Systems: A 12-system review of systems was performed and was negative except as noted in the HPI.  --------------------------------------------------------------------------------------------------  Physical Exam: BP 120/90 (BP Location: Left Arm, Patient Position: Sitting, Cuff Size: Normal)   Pulse 78   Ht 5\' 3"  (1.6 m)   Wt 173 lb (78.5 kg)   SpO2 96%   BMI 30.65 kg/m   General:  NAD. Neck: No JVD or HJR. Lungs: Clear to auscultation bilaterally without wheezes or crackles. Heart: Regular rate and rhythm without murmurs, rubs, or gallops. Abdomen: Soft, nontender, nondistended. Extremities: No lower extremity edema.  EKG: Normal sinus rhythm with nonspecific T wave abnormality.  No significant change from prior tracing on 04/04/2021.  Lab Results  Component Value Date   WBC 4.5 09/18/2021   HGB 13.1 09/18/2021   HCT 40.9 09/18/2021   MCV 77.4 (L) 09/18/2021   PLT 225.0 09/18/2021    Lab Results  Component Value Date   NA 139 09/18/2021   K 3.6 09/18/2021   CL 105 09/18/2021   CO2 26 09/18/2021   BUN 15 09/18/2021   CREATININE 0.77 09/18/2021   GLUCOSE 90 09/18/2021   ALT 11 09/18/2021    Lab Results  Component Value Date   CHOL 213 (H) 09/18/2021   HDL 83.00 09/18/2021   LDLCALC 117 (H) 09/18/2021   LDLDIRECT 126.7 10/23/2011   TRIG 66.0 09/18/2021   CHOLHDL 3 09/18/2021    --------------------------------------------------------------------------------------------------  ASSESSMENT AND PLAN: Precordial pain: [name] continues to have sporadic episodes of atypical chest pain.  Physical exam and EKG today are unchanged with nonspecific T wave abnormalities again noted.  We have discussed the role for ischemia evaluation and have agreed to pursue a coronary CTA.  We will defer Rie challenging her with isosorbide mononitrate given headaches.  It is okay to continue with standing metoprolol 12.5 mg twice daily for management of chest  pain and palpitations.  Palpitations and PSVT: Palpitations well controlled with only rare breakthrough symptoms on standing metoprolol.  Continue metoprolol tartrate 12.5 mg twice daily.  Hypertension: Blood pressure borderline today.  Defer medication changes at this time.  Follow-up: Return to clinic in 1 year, sooner if significant abnormality is identified on coronary CTA.  Mindy Bush, MD 10/01/2021 10:47 AM

## 2021-10-01 NOTE — Telephone Encounter (Signed)
Referral is in the workque. Patient will be scheduled then.

## 2021-10-06 ENCOUNTER — Other Ambulatory Visit: Payer: Self-pay

## 2021-10-06 DIAGNOSIS — Z1211 Encounter for screening for malignant neoplasm of colon: Secondary | ICD-10-CM

## 2021-10-06 MED ORDER — NA SULFATE-K SULFATE-MG SULF 17.5-3.13-1.6 GM/177ML PO SOLN: 1.0000 | Freq: Once | ORAL | 0 refills | Status: AC

## 2021-10-06 NOTE — Progress Notes (Signed)
Gastroenterology Pre-Procedure Review  Request Date: 12/23/2021 Requesting Physician: Dr. Allen Norris  PATIENT REVIEW QUESTIONS: The patient responded to the following health history questions as indicated:    1. Are you having any GI issues? no 2. Do you have a personal history of Polyps? no 3. Do you have a family history of Colon Cancer or Polyps? no 4. Diabetes Mellitus? no 5. Joint replacements in the past 12 months?no 6. Major health problems in the past 3 months?no 7. Any artificial heart valves, MVP, or defibrillator?no    MEDICATIONS & ALLERGIES:    Patient reports the following regarding taking any anticoagulation/antiplatelet therapy:   Plavix, Coumadin, Eliquis, Xarelto, Lovenox, Pradaxa, Brilinta, or Effient? no Aspirin? no  Patient confirms/reports the following medications:  Current Outpatient Medications  Medication Sig Dispense Refill   amLODipine (NORVASC) 5 MG tablet Take 1 tablet (5 mg total) by mouth daily. 90 tablet 3   Cholecalciferol (VITAMIN D3) 2000 UNITS capsule Take 2,000 Units by mouth daily.     diclofenac Sodium (VOLTAREN) 1 % GEL Apply 2 g topically 4 (four) times daily as needed. 100 g 11   gabapentin (NEURONTIN) 300 MG capsule Take 300 mg by mouth 3 (three) times daily as needed (pain).     ketoconazole (NIZORAL) 2 % cream Apply 1 application topically daily. To affected area/ foot 15 g 1   metoprolol tartrate (LOPRESSOR) 100 MG tablet Take 1 tablet (100 mg total) by mouth once for 1 dose. Take TWO hours prior to CT procedure 1 tablet 0   metoprolol tartrate (LOPRESSOR) 25 MG tablet Take 0.5 tablets (12.5 mg total) by mouth 2 (two) times daily. 90 tablet 1   omeprazole (PRILOSEC) 20 MG capsule TAKE 1 CAPSULE (20 MG TOTAL) BY MOUTH DAILY AS NEEDED. 270 capsule 1   tiZANidine (ZANAFLEX) 4 MG capsule Take 4 mg by mouth 3 (three) times daily as needed for muscle spasms.     No current facility-administered medications for this visit.    Patient  confirms/reports the following allergies:  Allergies  Allergen Reactions   Atenolol Cough   Hydrocodone Nausea Only    No orders of the defined types were placed in this encounter.   AUTHORIZATION INFORMATION Primary Insurance: 1D#: Group #:  Secondary Insurance: 1D#: Group #:  SCHEDULE INFORMATION: Date: 12/23/2021 Time: Location: [place]

## 2021-10-21 ENCOUNTER — Telehealth (HOSPITAL_COMMUNITY): Payer: Self-pay | Admitting: Emergency Medicine

## 2021-10-21 DIAGNOSIS — R072 Precordial pain: Secondary | ICD-10-CM

## 2021-10-21 NOTE — Telephone Encounter (Signed)
Attempted to call patient regarding upcoming cardiac CT appointment. Left message on voicemail with name and callback number Marchia Bond RN Navigator Cardiac Imaging [facility] [phone] Office [phone] Cell  BMP order placed for CCTA

## 2021-10-22 ENCOUNTER — Other Ambulatory Visit
Admission: RE | Admit: 2021-10-22 | Discharge: 2021-10-22 | Disposition: A | Discharge status: Home / Self Care | Payer: BC Managed Care – PPO | Attending: Cardiology | Admitting: Cardiology

## 2021-10-22 ENCOUNTER — Telehealth (HOSPITAL_COMMUNITY): Payer: Self-pay | Admitting: Emergency Medicine

## 2021-10-22 DIAGNOSIS — R072 Precordial pain: Secondary | ICD-10-CM | POA: Insufficient documentation

## 2021-10-22 LAB — BASIC METABOLIC PANEL
Anion gap: 6 (ref 5–15)
BUN: 13 mg/dL (ref 8–23)
CO2: 27 mmol/L (ref 22–32)
Calcium: 9.1 mg/dL (ref 8.9–10.3)
Chloride: 107 mmol/L (ref 98–111)
Creatinine, Ser: 0.87 mg/dL (ref 0.44–1.00)
GFR, Estimated: >60 mL/min (ref >60)
Glucose, Bld: 107 mg/dL — ABNORMAL HIGH (ref 70–99)
Potassium: 4 mmol/L (ref 3.5–5.1)
Sodium: 140 mmol/L (ref 135–145)

## 2021-10-22 NOTE — Telephone Encounter (Signed)
Reaching out to patient to offer assistance regarding upcoming cardiac imaging study; pt verbalizes understanding of appt date/time, parking situation and where to check in, pre-test NPO status and medications ordered, and verified current allergies; name and call back number provided for further questions should they arise Marchia Bond RN Navigator Cardiac Imaging [facility] [phone] office [phone] cell  Arrival time 1215 Denies iv issues 100mg  metoprolol 2h pta

## 2021-10-23 ENCOUNTER — Ambulatory Visit
Admission: RE | Admit: 2021-10-23 | Discharge: 2021-10-23 | Disposition: A | Discharge status: Home / Self Care | Payer: BC Managed Care – PPO | Source: Ambulatory Visit | Attending: Internal Medicine | Admitting: Internal Medicine

## 2021-10-23 ENCOUNTER — Other Ambulatory Visit: Payer: Self-pay

## 2021-10-23 DIAGNOSIS — R072 Precordial pain: Secondary | ICD-10-CM | POA: Insufficient documentation

## 2021-10-23 MED ORDER — NITROGLYCERIN 0.4 MG SL SUBL
0.8000 mg | SUBLINGUAL_TABLET | Freq: Once | SUBLINGUAL | Status: AC
  Administered 2021-10-23: 13:00:00 0.8 mg via SUBLINGUAL

## 2021-10-23 MED ORDER — METOPROLOL TARTRATE 5 MG/5ML IV SOLN
10.0000 mg | Freq: Once | INTRAVENOUS | Status: AC
  Administered 2021-10-23: 13:00:00 10 mg via INTRAVENOUS

## 2021-10-23 MED ORDER — IOHEXOL 350 MG/ML SOLN
100.0000 mL | Freq: Once | INTRAVENOUS | Status: AC | PRN
  Administered 2021-10-23: 13:00:00 100 mL via INTRAVENOUS

## 2021-10-23 NOTE — Progress Notes (Signed)
Patient tolerated CT well. Drank water after. Vital signs stable encourage to drink water throughout day.Reasons explained and verbalized understanding. Ambulated steady gait.  

## 2021-10-24 ENCOUNTER — Telehealth: Payer: Self-pay | Admitting: Internal Medicine

## 2021-10-24 DIAGNOSIS — R918 Other nonspecific abnormal finding of lung field: Secondary | ICD-10-CM

## 2021-10-24 DIAGNOSIS — R911 Solitary pulmonary nodule: Secondary | ICD-10-CM

## 2021-10-24 NOTE — Telephone Encounter (Signed)
-----   Message from Nelva Bush, MD sent at 10/24/2021  9:53 AM EST ----- Please let Mindy Long know that her coronary CTA shows no evidence of narrowing or blockage in her heart arteries, which is great news.  A few small nodules were see in her lungs, which cannot be adequately characterized on this test.  I recommend that we get a dedicated CT of the chest without contrast at her earliest convenience for further evaluation.  She should continue her current medications and follow-up as planned in about a year (sooner if new concerns arise).

## 2021-10-24 NOTE — Telephone Encounter (Signed)
Spoke with pt. Notified of coronary CTA results and findings with Dr. Darnelle Bos recc below.  Pt voiced understanding.  Orders placed for chest CT without contrast.  Pt given number for centralized scheduling and states she will call Monday to schedule.  Sent to precert.  Pt has no further questions at this time.

## 2021-10-24 NOTE — Telephone Encounter (Signed)
Received abnormal result call from [facility] for this pt's coronary CTA:  There are several new lung nodules when compared with CT of the chest from 02/16/2018. These measure up to 6 mm. Recommend further evaluation with dedicated CT of the chest.  Results are in Epic for review.  Will make Dr. Saunders Revel aware.

## 2021-10-24 NOTE — Telephone Encounter (Signed)
[facility] is calling with report from CT Calcium Score.

## 2021-10-29 NOTE — Telephone Encounter (Signed)
Reviewed the patient's chart. Her Chest CT is scheduled for 11/19/21 at 1:30 pm at [facility].

## 2021-11-19 ENCOUNTER — Other Ambulatory Visit: Payer: Self-pay

## 2021-11-19 ENCOUNTER — Ambulatory Visit
Admission: RE | Admit: 2021-11-19 | Discharge: 2021-11-19 | Disposition: A | Discharge status: Home / Self Care | Payer: BC Managed Care – PPO | Source: Ambulatory Visit | Attending: Internal Medicine | Admitting: Internal Medicine

## 2021-11-19 DIAGNOSIS — R911 Solitary pulmonary nodule: Secondary | ICD-10-CM | POA: Diagnosis not present

## 2021-11-19 DIAGNOSIS — R918 Other nonspecific abnormal finding of lung field: Secondary | ICD-10-CM | POA: Diagnosis not present

## 2021-11-19 DIAGNOSIS — I700 Atherosclerosis of aorta: Secondary | ICD-10-CM | POA: Diagnosis not present

## 2021-11-24 ENCOUNTER — Telehealth: Payer: Self-pay | Admitting: Emergency Medicine

## 2021-11-24 ENCOUNTER — Telehealth: Payer: Self-pay | Admitting: *Deleted

## 2021-11-24 NOTE — Telephone Encounter (Signed)
Patient returning call.

## 2021-11-24 NOTE — Telephone Encounter (Signed)
Reviewed results with patient and she verbalized understanding with no further questions at this time.  

## 2021-11-24 NOTE — Telephone Encounter (Signed)
Called patient to go over results, lmtcb

## 2021-11-24 NOTE — Telephone Encounter (Signed)
-----   Message from Nelva Bush, MD sent at 11/24/2021  7:27 AM EST ----- Please let Mindy Long know that her chest CT shows that previously noted lung nodules are much smaller in size or completely gone compared to her coronary CTA last month.  This suggests that they were likely related to a benign inflammatory or infectious process.  No further imaging is recommended at this time.  If she develops shortness of breath or cough, she should follow-up with her PCP for further evaluation.

## 2021-11-24 NOTE — Telephone Encounter (Signed)
Left voicemail message to call back for review of results.  

## 2021-12-16 ENCOUNTER — Telehealth: Payer: Self-pay

## 2021-12-16 NOTE — Telephone Encounter (Signed)
Returned patients call. LVM to call office back. 

## 2021-12-17 ENCOUNTER — Telehealth: Payer: Self-pay

## 2021-12-17 NOTE — Telephone Encounter (Signed)
Explained to patient where the procedure instructions are located in my chart. Informed instructions were mailed out to her home as well. She believes she has lost them or misplaced them. Pt verbalized understanding.

## 2021-12-18 ENCOUNTER — Other Ambulatory Visit: Payer: Self-pay

## 2021-12-18 ENCOUNTER — Encounter: Payer: Self-pay | Admitting: Family Medicine

## 2021-12-18 ENCOUNTER — Ambulatory Visit (INDEPENDENT_AMBULATORY_CARE_PROVIDER_SITE_OTHER): Payer: BC Managed Care – PPO | Admitting: Family Medicine

## 2021-12-18 DIAGNOSIS — L729 Follicular cyst of the skin and subcutaneous tissue, unspecified: Secondary | ICD-10-CM | POA: Diagnosis not present

## 2021-12-18 DIAGNOSIS — L089 Local infection of the skin and subcutaneous tissue, unspecified: Secondary | ICD-10-CM | POA: Insufficient documentation

## 2021-12-18 DIAGNOSIS — R519 Headache, unspecified: Secondary | ICD-10-CM | POA: Diagnosis not present

## 2021-12-18 MED ORDER — AMOXICILLIN-POT CLAVULANATE 875-125 MG PO TABS: 1.0000 | ORAL_TABLET | Freq: Two times a day (BID) | ORAL | 0 refills | Status: DC

## 2021-12-18 NOTE — Patient Instructions (Addendum)
Keep area clean with soap and water [place] if needed  Take augmentin as directed   If worse or no improvement let us know    Use ice on the area of head pain  An anti inflammatory is helpful also (ibuprofen or voltaren gel)  Let us know if that does not improve or if it does not improve   Drink lots of fluids

## 2021-12-18 NOTE — Progress Notes (Addendum)
Subjective:    Patient ID: Mindy Long, female    DOB: 09/02/1961, 61 y.o.   MRN: [id number]  This visit occurred during the SARS-CoV-2 public health emergency.  Safety protocols were in place, including screening questions prior to the visit, additional usage of staff PPE, and extensive cleaning of exam room while observing appropriate contact time as indicated for disinfecting solutions.   HPI Pt presents with swelling or abscess of the rectum  Also pain in L occiput   Wt Readings from Last 3 Encounters:  12/18/21 168 lb 4 oz (76.3 kg)  10/01/21 173 lb (78.5 kg)  09/23/21 173 lb 2 oz (78.5 kg)   30.77 kg/m   Started a week ago Used warm compresses  May have opened last night- draining  Painful but able to sit  Recurrent  May have another one in groin   None in axillae or above umbilicus   No fever   Has a pain in her L occiput area/base of skull like a headache  Constant -not throbbing   Has not tried ice on it   BP Readings from Last 3 Encounters:  12/18/21 124/82  10/23/21 128/67  10/01/21 120/90    Pulse Readings from Last 3 Encounters:  12/18/21 90  10/23/21 60  10/01/21 78    Patient Active Problem List   Diagnosis Date Noted   Infected cyst of skin 12/18/2021   PSVT (paroxysmal supraventricular tachycardia) ([place]) 10/01/2021   Thyroid nodule 09/23/2021   Rash of foot 09/23/2021   Chest pain 04/01/2021   Localized skin mass, lump, or swelling 10/28/2020   Cyst of skin of breast 09/25/2019   Abnormal serum ACE level 04/08/2018   Eye exam abnormal 08/04/2017   Tinnitus 11/13/2014   History of shingles 04/27/2014   Low back pain 04/20/2014   OSA (obstructive sleep apnea) 09/27/2012   Snoring 06/21/2012   Fatigue 06/20/2012   Colon cancer screening 11/04/2011   Routine general medical examination at a health care facility 10/22/2011   Vitamin D deficiency 07/02/2010   Palpitations 04/07/2010   HYPOKALEMIA 07/10/2009   Allergic rhinitis  04/03/2008   Essential hypertension 01/25/2008   G E R D 07/05/2007   H/O peptic ulcer 07/05/2007   Headache, chronic daily 07/05/2007   NEPHROLITHIASIS, HX OF 07/05/2007   Past Medical History:  Diagnosis Date   Back pain    Chronic back pain    GERD (gastroesophageal reflux disease)    Headache(784.0)    History of nephrolithiasis    Hypertension    Plantar fasciitis    Sleep apnea    Ulcer 1980's   peptic ulcer disease   Past Surgical History:  Procedure Laterality Date   ABDOMINAL HYSTERECTOMY  2002   CHOLECYSTECTOMY  1984   COLONOSCOPY  12/18/2011   Normal   IR ANGIO EXTERNAL CAROTID SEL EXT CAROTID BILAT MOD SED  11/15/2018   IR ANGIO INTRA EXTRACRAN SEL INTERNAL CAROTID BILAT MOD SED  11/15/2018   IR ANGIO VERTEBRAL SEL VERTEBRAL UNI R MOD SED  11/15/2018   IR US GUIDE VASC ACCESS RIGHT  11/15/2018   LIPOMA EXCISION Left 05/20/2021   Procedure: EXCISION LIPOMA OF LEFT BUTTOCK;  Surgeon: Johnathan Hausen, MD;  Location: [facility];  Service: General;  Laterality: Left;   OOPHORECTOMY     Social History   Tobacco Use   Smoking status: Never   Smokeless tobacco: Never  Vaping Use   Vaping Use: Never used  Substance Use Topics  Alcohol use: Yes    Comment: occas.   Drug use: No   Family History  Problem Relation Age of Onset   Stroke Father    Hypertension Father    Diabetes Father    Cancer Brother        stomach tumor   Allergies  Allergen Reactions   Atenolol Cough   Hydrocodone Nausea Only   Current Outpatient Medications on File Prior to Visit  Medication Sig Dispense Refill   amLODipine (NORVASC) 5 MG tablet Take 1 tablet (5 mg total) by mouth daily. 90 tablet 3   Cholecalciferol (VITAMIN D3) 2000 UNITS capsule Take 2,000 Units by mouth daily.     diclofenac Sodium (VOLTAREN) 1 % GEL Apply 2 g topically 4 (four) times daily as needed. 100 g 11   gabapentin (NEURONTIN) 300 MG capsule Take 300 mg by mouth 3 (three) times daily as needed (pain).      ketoconazole (NIZORAL) 2 % cream Apply 1 application topically daily. To affected area/ foot 15 g 1   metoprolol tartrate (LOPRESSOR) 25 MG tablet Take 0.5 tablets (12.5 mg total) by mouth 2 (two) times daily. 90 tablet 1   omeprazole (PRILOSEC) 20 MG capsule TAKE 1 CAPSULE (20 MG TOTAL) BY MOUTH DAILY AS NEEDED. 270 capsule 1   tiZANidine (ZANAFLEX) 4 MG capsule Take 4 mg by mouth 3 (three) times daily as needed for muscle spasms.     No current facility-administered medications on file prior to visit.      Review of Systems  Constitutional:  Negative for activity change, appetite change, fatigue, fever and unexpected weight change.  HENT:  Negative for congestion, ear pain, rhinorrhea, sinus pressure and sore throat.   Eyes:  Negative for pain, redness and visual disturbance.  Respiratory:  Negative for cough, shortness of breath and wheezing.   Cardiovascular:  Negative for chest pain and palpitations.  Gastrointestinal:  Negative for abdominal pain, blood in stool, constipation and diarrhea.  Endocrine: Negative for polydipsia and polyuria.  Genitourinary:  Negative for dysuria, frequency and urgency.  Musculoskeletal:  Negative for arthralgias, back pain and myalgias.  Skin:  Positive for color change. Negative for pallor and rash.  Allergic/Immunologic: Negative for environmental allergies.  Neurological:  Negative for dizziness, syncope and headaches.  Hematological:  Negative for adenopathy. Does not bruise/bleed easily.  Psychiatric/Behavioral:  Negative for decreased concentration and dysphoric mood. The patient is not nervous/anxious.       Objective:   Physical Exam Constitutional:      General: She is not in acute distress.    Appearance: Normal appearance. She is obese. She is not ill-appearing.  Eyes:     General:        Right eye: No discharge.        Left eye: No discharge.     Conjunctiva/sclera: Conjunctivae normal.     Pupils: Pupils are equal, round, and  reactive to light.  Neck:     Comments: Nl rom neck  No tenderness or swelling over L occiput  Cervical musculature feels tight    Cardiovascular:     Rate and Rhythm: Normal rate and regular rhythm.  Pulmonary:     Effort: Pulmonary effort is normal. No respiratory distress.  Abdominal:     General: Abdomen is flat. Bowel sounds are normal. There is no distension.     Palpations: Abdomen is soft.     Tenderness: There is no abdominal tenderness.     Comments:  Small lump  in inguinal area on L consistent with LN or deep cyst  Musculoskeletal:     Cervical back: Neck supple.  Lymphadenopathy:     Cervical: No cervical adenopathy.  Skin:    General: Skin is warm and dry.     Coloration: Skin is not pale.     Findings: No bruising or rash.     Comments: 1 cm oval cyst 2-3 cm left of anus in soft tissue, with a small hold draining clear fluid and blood  Mildly tender and soft  Nl appearing anus  No external hemorrhoids   Neurological:     Mental Status: She is alert.  Psychiatric:        Mood and Affect: Mood normal.          Assessment & Plan:   Problem List Items Addressed This Visit       Musculoskeletal and Integument   Infected cyst of skin    This resembles a follicular type of cyst moreso than a peri rectal abscess  She gets them recurrently  Possible mild hydradenitis   Disc use of anti bacterial cleansers  Warm compress augmentin px inst to f/u if any inc in size/pain/erythema  ER precautions discussed        Other   Head pain    Pain in L occiput with nl exam Suspect due to tight cervical musculature Adv use of ice and nsaid or voltaren gel zanaflex may help  If no improvement consider poss occipital neuralgia She already takes gabapentin  Will continue to monitor

## 2021-12-18 NOTE — Addendum Note (Signed)
Addended by: Loura Pardon A on: 12/18/2021 03:42 PM   Modules accepted: Level of Service

## 2021-12-18 NOTE — Assessment & Plan Note (Signed)
Pain in L occiput with nl exam Suspect due to tight cervical musculature Adv use of ice and nsaid or voltaren gel zanaflex may help  If no improvement consider poss occipital neuralgia She already takes gabapentin  Will continue to monitor

## 2021-12-18 NOTE — Assessment & Plan Note (Addendum)
This resembles a follicular type of cyst moreso than a peri rectal abscess  She gets them recurrently  Possible mild hydradenitis   Disc use of anti bacterial cleansers  Warm compress augmentin px inst to f/u if any inc in size/pain/erythema  ER precautions discussed

## 2021-12-22 ENCOUNTER — Encounter: Payer: Self-pay | Admitting: Gastroenterology

## 2021-12-23 ENCOUNTER — Ambulatory Visit: Payer: BC Managed Care – PPO | Admitting: Registered Nurse

## 2021-12-23 ENCOUNTER — Ambulatory Visit
Admission: RE | Admit: 2021-12-23 | Discharge: 2021-12-23 | Disposition: A | Discharge status: Home / Self Care | Payer: BC Managed Care – PPO | Attending: Gastroenterology | Admitting: Gastroenterology

## 2021-12-23 ENCOUNTER — Encounter
Admission: RE | Disposition: A | Discharge status: Home / Self Care | Payer: Self-pay | Source: Home / Self Care | Attending: Gastroenterology

## 2021-12-23 ENCOUNTER — Encounter: Payer: Self-pay | Admitting: Gastroenterology

## 2021-12-23 DIAGNOSIS — D122 Benign neoplasm of ascending colon: Secondary | ICD-10-CM | POA: Insufficient documentation

## 2021-12-23 DIAGNOSIS — D125 Benign neoplasm of sigmoid colon: Secondary | ICD-10-CM | POA: Diagnosis not present

## 2021-12-23 DIAGNOSIS — D126 Benign neoplasm of colon, unspecified: Secondary | ICD-10-CM | POA: Diagnosis not present

## 2021-12-23 DIAGNOSIS — Z1211 Encounter for screening for malignant neoplasm of colon: Secondary | ICD-10-CM | POA: Diagnosis not present

## 2021-12-23 DIAGNOSIS — I10 Essential (primary) hypertension: Secondary | ICD-10-CM | POA: Insufficient documentation

## 2021-12-23 DIAGNOSIS — K635 Polyp of colon: Secondary | ICD-10-CM

## 2021-12-23 DIAGNOSIS — G4730 Sleep apnea, unspecified: Secondary | ICD-10-CM | POA: Insufficient documentation

## 2021-12-23 DIAGNOSIS — K219 Gastro-esophageal reflux disease without esophagitis: Secondary | ICD-10-CM | POA: Insufficient documentation

## 2021-12-23 DIAGNOSIS — Z79899 Other long term (current) drug therapy: Secondary | ICD-10-CM | POA: Diagnosis not present

## 2021-12-23 DIAGNOSIS — D123 Benign neoplasm of transverse colon: Secondary | ICD-10-CM | POA: Diagnosis not present

## 2021-12-23 HISTORY — DX: Personal history of COVID-19: Z86.16

## 2021-12-23 HISTORY — DX: Supraventricular tachycardia: I47.1

## 2021-12-23 HISTORY — DX: Other nonspecific abnormal finding of lung field: R91.8

## 2021-12-23 HISTORY — DX: Supraventricular tachycardia, unspecified: I47.10

## 2021-12-23 HISTORY — PX: COLONOSCOPY WITH PROPOFOL: SHX5780

## 2021-12-23 HISTORY — DX: Personal history of urinary calculi: Z87.442

## 2021-12-23 SURGERY — COLONOSCOPY WITH PROPOFOL
Anesthesia: General

## 2021-12-23 MED ORDER — SODIUM CHLORIDE 0.9 % IV SOLN
INTRAVENOUS | Status: DC
  Administered 2021-12-23: 1000 mL via INTRAVENOUS

## 2021-12-23 MED ORDER — PROPOFOL 10 MG/ML IV BOLUS
INTRAVENOUS | Status: DC | PRN
  Administered 2021-12-23: 90 mg via INTRAVENOUS

## 2021-12-23 MED ORDER — LIDOCAINE HCL (CARDIAC) PF 100 MG/5ML IV SOSY
PREFILLED_SYRINGE | INTRAVENOUS | Status: DC | PRN
  Administered 2021-12-23: 100 mg via INTRAVENOUS

## 2021-12-23 MED ORDER — PROPOFOL 500 MG/50ML IV EMUL
INTRAVENOUS | Status: DC | PRN
  Administered 2021-12-23: 150 ug/kg/min via INTRAVENOUS

## 2021-12-23 NOTE — Anesthesia Procedure Notes (Signed)
Date/Time: 12/23/2021 8:05 AM Performed by: Hedda Slade, CRNA Pre-anesthesia Checklist: Patient identified, Emergency Drugs available, Suction available and Patient being monitored Patient Re-evaluated:Patient Re-evaluated prior to induction Oxygen Delivery Method: Nasal cannula Preoxygenation: Pre-oxygenation with 100% oxygen Induction Type: IV induction Ventilation: Mask ventilation without difficulty and Mask ventilation throughout procedure Airway Equipment and Method: Bite block Placement Confirmation: positive ETCO2 Dental Injury: Teeth and Oropharynx as per pre-operative assessment

## 2021-12-23 NOTE — Transfer of Care (Signed)
Immediate Anesthesia Transfer of Care Note  Patient: Mindy Long  Procedure(s) Performed: COLONOSCOPY WITH PROPOFOL  Patient Location: PACU  Anesthesia Type:General  Level of Consciousness: awake, alert  and oriented  Airway & Oxygen Therapy: spontaneous on room air  Post-op Assessment: Report given to RN and Post -op Vital signs reviewed and stable  Post vital signs: Reviewed and stable  Last Vitals:  Vitals Value Taken Time  BP    Temp    Pulse 94 12/23/21 0826  Resp 19 12/23/21 0826  SpO2 96 % 12/23/21 0826  Vitals shown include unvalidated device data.  Last Pain:  Vitals:   12/23/21 0740  TempSrc: Temporal  PainSc: 0-No pain         Complications: No notable events documented.

## 2021-12-23 NOTE — H&P (Signed)
Lucilla Lame, MD [facility] [address] [place], [place] [address] Phone: [phone] Fax : [phone]  Primary Care Physician:  Tower, Wynelle Fanny, MD Primary Gastroenterologist:  Dr. Allen Norris  Pre-Procedure History & Physical: HPI:  Mindy Long is a 61 y.o. female is here for a screening colonoscopy.   Past Medical History:  Diagnosis Date   Back pain    Chronic back pain    GERD (gastroesophageal reflux disease)    Headache(784.0)    History of COVID-19    History of kidney stones    History of nephrolithiasis    Hypertension    Plantar fasciitis    PSVT (paroxysmal supraventricular tachycardia) ([facility])    Pulmonary nodules    Sleep apnea    Ulcer 1980's   peptic ulcer disease    Past Surgical History:  Procedure Laterality Date   ABDOMINAL HYSTERECTOMY  2002   CHOLECYSTECTOMY  1984   COLONOSCOPY  12/18/2011   Normal   IR ANGIO EXTERNAL CAROTID SEL EXT CAROTID BILAT MOD SED  11/15/2018   IR ANGIO INTRA EXTRACRAN SEL INTERNAL CAROTID BILAT MOD SED  11/15/2018   IR ANGIO VERTEBRAL SEL VERTEBRAL UNI R MOD SED  11/15/2018   IR US GUIDE VASC ACCESS RIGHT  11/15/2018   LIPOMA EXCISION Left 05/20/2021   Procedure: EXCISION LIPOMA OF LEFT BUTTOCK;  Surgeon: Johnathan Hausen, MD;  Location: [facility];  Service: General;  Laterality: Left;   OOPHORECTOMY      Prior to Admission medications   Medication Sig Start Date End Date Taking? Authorizing Provider  amLODipine (NORVASC) 5 MG tablet Take 1 tablet (5 mg total) by mouth daily. 09/23/21  Yes Tower, Wynelle Fanny, MD  amoxicillin-clavulanate (AUGMENTIN) 875-125 MG tablet Take 1 tablet by mouth 2 (two) times daily. 12/18/21  Yes Tower, Wynelle Fanny, MD  Cholecalciferol (VITAMIN D3) 2000 UNITS capsule Take 2,000 Units by mouth daily.   Yes [provider]  diclofenac Sodium (VOLTAREN) 1 % GEL Apply 2 g topically 4 (four) times daily as needed. 09/23/21  Yes Tower, Wynelle Fanny, MD  gabapentin (NEURONTIN) 300 MG capsule Take 300 mg  by mouth 3 (three) times daily as needed (pain).   Yes [provider]  ketoconazole (NIZORAL) 2 % cream Apply 1 application topically daily. To affected area/ foot 09/23/21  Yes Tower, Wynelle Fanny, MD  metoprolol tartrate (LOPRESSOR) 25 MG tablet Take 0.5 tablets (12.5 mg total) by mouth 2 (two) times daily. 10/01/21  Yes End, Harrell Gave, MD  omeprazole (PRILOSEC) 20 MG capsule TAKE 1 CAPSULE (20 MG TOTAL) BY MOUTH DAILY AS NEEDED. 10/17/20  Yes Tower, Marne A, MD  tiZANidine (ZANAFLEX) 4 MG capsule Take 4 mg by mouth 3 (three) times daily as needed for muscle spasms.   Yes [provider]    Allergies as of 10/06/2021 - Review Complete 10/01/2021  Allergen Reaction Noted   Atenolol Cough 11/04/2011   Hydrocodone Nausea Only 04/26/2013    Family History  Problem Relation Age of Onset   Stroke Father    Hypertension Father    Diabetes Father    Cancer Brother        stomach tumor    Social History   Socioeconomic History   Marital status: Married    Spouse name: Not on file   Number of children: Not on file   Years of education: Not on file   Highest education level: Not on file  Occupational History   Occupation: ar specialist  Tobacco  Use   Smoking status: Never   Smokeless tobacco: Never  Vaping Use   Vaping Use: Never used  Substance and Sexual Activity   Alcohol use: Yes    Comment: occas.   Drug use: No   Sexual activity: Not on file  Other Topics Concern   Not on file  Social History Narrative   Walks for exercise   Social Determinants of Health   Financial Resource Strain: Not on file  Food Insecurity: Not on file  Transportation Needs: Not on file  Physical Activity: Not on file  Stress: Not on file  Social Connections: Not on file  Intimate Partner Violence: Not on file    Review of Systems: See HPI, otherwise negative ROS  Physical Exam: BP (!) 133/97    Pulse (!) 107    Temp (!) 96.1 F (35.6 C) (Temporal)    Resp 16    Ht 5'  3" (1.6 m)    Wt 72.7 kg    SpO2 99%    BMI 28.40 kg/m  General:   Alert,  pleasant and cooperative in NAD Head:  Normocephalic and atraumatic. Neck:  Supple; no masses or thyromegaly. Lungs:  Clear throughout to auscultation.    Heart:  Regular rate and rhythm. Abdomen:  Soft, nontender and nondistended. Normal bowel sounds, without guarding, and without rebound.   Neurologic:  Alert and  oriented x4;  grossly normal neurologically.  Impression/Plan: Mindy Long is now here to undergo a screening colonoscopy.  Risks, benefits, and alternatives regarding colonoscopy have been reviewed with the patient.  Questions have been answered.  All parties agreeable.

## 2021-12-23 NOTE — Op Note (Signed)
[facility]astroenter[facility] Patient Name: Mindy Long Procedure Date: 12/23/2021 8:01 AM MRN: [id number] Account #: [id number] Date of Birth: [birth year] Admit Type: Outpatient Age: 61 Room: [facility] ENDO ROOM 4 Gender: Female Note Status: Finalized Instrument Name: Jasper Riling [id number] Procedure:             Colonoscopy Indications:           Screening for colorectal malignant neoplasm Providers:             Lucilla Lame MD, MD Referring MD:          Wynelle Fanny. Tower (Referring MD) Medicines:             Propofol per Anesthesia Complications:         No immediate complications. Procedure:             Pre-Anesthesia Assessment:                        - Prior to the procedure, a History and Physical was                         performed, and patient medications and allergies were                         reviewed. The patient's tolerance of previous                         anesthesia was also reviewed. The risks and benefits                         of the procedure and the sedation options and risks                         were discussed with the patient. All questions were                         answered, and informed consent was obtained. Prior                         Anticoagulants: The patient has taken no previous                         anticoagulant or antiplatelet agents. ASA Grade                         Assessment: II - A patient with mild systemic disease.                         After reviewing the risks and benefits, the patient                         was deemed in satisfactory condition to undergo the                         procedure.                        After obtaining informed consent, the colonoscope was  passed under direct vision. Throughout the procedure,                         the patient's blood pressure, pulse, and oxygen                         saturations were monitored continuously. The                          Colonoscope was introduced through the anus and                         advanced to the the cecum, identified by appendiceal                         orifice and ileocecal valve. The colonoscopy was                         performed without difficulty. The patient tolerated                         the procedure well. The quality of the bowel                         preparation was excellent. Findings:      The perianal and digital rectal examinations were normal.      A 8 mm polyp was found in the ascending colon. The polyp was sessile.       The polyp was removed with a cold snare. Resection and retrieval were       complete.      A 5 mm polyp was found in the transverse colon. The polyp was sessile.       The polyp was removed with a cold snare. Resection and retrieval were       complete.      A 4 mm polyp was found in the sigmoid colon. The polyp was sessile. The       polyp was removed with a cold snare. Resection and retrieval were       complete. Impression:            - One 8 mm polyp in the ascending colon, removed with                         a cold snare. Resected and retrieved.                        - One 5 mm polyp in the transverse colon, removed with                         a cold snare. Resected and retrieved.                        - One 4 mm polyp in the sigmoid colon, removed with a                         cold snare. Resected and retrieved. Recommendation:        - Discharge patient to home.                        -  Resume previous diet.                        - Continue present medications.                        - Await pathology results.                        - If the pathology report reveals adenomatous tissue,                         then repeat the colonoscopy for surveillance in 5                         years. Procedure Code(s):     --- Professional ---                        [id number], Colonoscopy, flexible; with removal of                         tumor(s),  polyp(s), or other lesion(s) by snare                         technique Diagnosis Code(s):     --- Professional ---                        Z12.11, Encounter for screening for malignant neoplasm                         of colon                        K63.5, Polyp of colon CPT copyright 2019 American Medical Association. All rights reserved. The codes documented in this report are preliminary and upon coder review may  be revised to meet current compliance requirements. Lucilla Lame MD, MD 12/23/2021 8:26:22 AM This report has been signed electronically. Number of Addenda: 0 Note Initiated On: 12/23/2021 8:01 AM Scope Withdrawal Time: 0 hours 11 minutes 28 seconds  Total Procedure Duration: 0 hours 15 minutes 5 seconds  Estimated Blood Loss:  Estimated blood loss: none.      [facility]

## 2021-12-24 ENCOUNTER — Encounter: Payer: Self-pay | Admitting: Gastroenterology

## 2021-12-24 LAB — SURGICAL PATHOLOGY

## 2021-12-25 ENCOUNTER — Encounter: Payer: Self-pay | Admitting: Gastroenterology

## 2021-12-29 NOTE — Anesthesia Preprocedure Evaluation (Signed)
Anesthesia Evaluation  Patient identified by MRN, date of birth, ID band Patient awake    Reviewed: Allergy & Precautions, H&P , NPO status , Patient's Chart, lab work & pertinent test results, reviewed documented beta blocker date and time   History of Anesthesia Complications Negative for: history of anesthetic complications  Airway Mallampati: II  TM Distance: >3 FB Neck ROM: full    Dental  (+) Dental Advidsory Given   Pulmonary neg shortness of breath, sleep apnea and Continuous Positive Airway Pressure Ventilation , neg recent URI,    Pulmonary exam normal breath sounds clear to auscultation       Cardiovascular Exercise Tolerance: Good hypertension, (-) Past MI and (-) Cardiac Stents Normal cardiovascular exam(-) dysrhythmias (-) Valvular Problems/Murmurs Rhythm:regular Rate:Normal     Neuro/Psych negative neurological ROS  negative psych ROS   GI/Hepatic Neg liver ROS, GERD  ,  Endo/Other  negative endocrine ROS  Renal/GU negative Renal ROS  negative genitourinary   Musculoskeletal   Abdominal   Peds  Hematology negative hematology ROS (+)   Anesthesia Other Findings Past Medical History: No date: Back pain No date: Chronic back pain No date: GERD (gastroesophageal reflux disease) No date: Headache(784.0) No date: History of COVID-19 No date: History of kidney stones No date: History of nephrolithiasis No date: Hypertension No date: Plantar fasciitis No date: PSVT (paroxysmal supraventricular tachycardia) ([facility]) No date: Pulmonary nodules No date: Sleep apnea 1980's: Ulcer     Comment:  peptic ulcer disease   Reproductive/Obstetrics negative OB ROS                             Anesthesia Physical Anesthesia Plan  ASA: 2  Anesthesia Plan: General   Post-op Pain Management:    Induction: Intravenous  PONV Risk Score and Plan: Propofol infusion and TIVA  Airway  Management Planned: Natural Airway and Nasal Cannula  Additional Equipment:   Intra-op Plan:   Post-operative Plan:   Informed Consent: I have reviewed the patients History and Physical, chart, labs and discussed the procedure including the risks, benefits and alternatives for the proposed anesthesia with the patient or authorized representative who has indicated his/her understanding and acceptance.     Dental Advisory Given  Plan Discussed with: Anesthesiologist, CRNA and Surgeon  Anesthesia Plan Comments:         Anesthesia Quick Evaluation

## 2021-12-29 NOTE — Anesthesia Postprocedure Evaluation (Signed)
Anesthesia Post Note  Patient: Mindy Long  Procedure(s) Performed: COLONOSCOPY WITH PROPOFOL  Patient location during evaluation: Endoscopy Anesthesia Type: General Level of consciousness: awake and alert Pain management: pain level controlled Vital Signs Assessment: post-procedure vital signs reviewed and stable Respiratory status: spontaneous breathing, nonlabored ventilation, respiratory function stable and patient connected to nasal cannula oxygen Cardiovascular status: blood pressure returned to baseline and stable Postop Assessment: no apparent nausea or vomiting Anesthetic complications: no   No notable events documented.   Last Vitals:  Vitals:   12/23/21 0830 12/23/21 0840  BP: 98/69 (!) 88/59  Pulse: 94 88  Resp: 18 18  Temp:    SpO2: 96% 100%    Last Pain:  Vitals:   12/23/21 0820  TempSrc: Temporal  PainSc:                  Martha Clan

## 2022-01-05 ENCOUNTER — Other Ambulatory Visit: Payer: Self-pay | Admitting: Family Medicine

## 2022-02-06 ENCOUNTER — Ambulatory Visit
Admission: EM | Admit: 2022-02-06 | Discharge: 2022-02-06 | Disposition: A | Discharge status: Home / Self Care | Payer: BC Managed Care – PPO

## 2022-02-06 ENCOUNTER — Other Ambulatory Visit: Payer: Self-pay

## 2022-02-06 ENCOUNTER — Telehealth: Payer: Self-pay

## 2022-02-06 ENCOUNTER — Encounter: Payer: Self-pay | Admitting: Emergency Medicine

## 2022-02-06 DIAGNOSIS — R519 Headache, unspecified: Secondary | ICD-10-CM | POA: Diagnosis not present

## 2022-02-06 DIAGNOSIS — I10 Essential (primary) hypertension: Secondary | ICD-10-CM | POA: Diagnosis not present

## 2022-02-06 NOTE — Telephone Encounter (Signed)
Aware, will watch for correspondence  

## 2022-02-06 NOTE — ED Provider Notes (Signed)
[facility]    CSN: [id number] Arrival date & time: 02/06/22  1344      History   Chief Complaint Chief Complaint  Patient presents with   Dizziness   Headache   Hypertension    HPI Mindy Long is a 61 y.o. female.   61yo female with known hx of HTN and palpitations presents today with concerns of headache and dizziness intermittently since Wednesday. She states she just recently started working out again at [employer] and noted that over the past three days she has had a headache and abnormal BP after returning home. She has been doing zumba and other classes but denies weight lifting. Reports headache on the L side of her head over the parietal region. She denies this as the "worse headache of her life" and denies "thunderclap headache." She states her scalp almost feels tender to touch, but admits the headache feels "deeper than that." When she first developed the dizziness, she states she took her BP at home and it was 025 systolic. This was shortly after completing her workout. Pt admits she took her metoprolol tartrate and then her BP came down. She admits that the headache and dizziness resolved upon lowering of BP. Pt states this has happened three separate times in which BP has elevated after working out, caused headache and dizziness, and BP has responded to administration of metoprolol. She denies being dizzy in office today. She denies weakness, slurred speech, dysarthria, thunderclap headache, tinnitus, unsteady gait, tremor, blurred vision or any additional neurological complaints. Was placed on metoprolol tartrate years ago by a cardiologist due to benign palpitations. She states over the past two days she feels like they have come back, but not as severe as before the medication. Denies any OTC med use or new Rx medications.   Dizziness Associated symptoms: headaches and palpitations   Associated symptoms: no chest pain, no shortness of breath and no weakness    Headache Associated symptoms: dizziness   Associated symptoms: no fatigue, no fever, no neck stiffness, no numbness, no seizures and no weakness   Hypertension Associated symptoms include headaches. Pertinent negatives include no chest pain and no shortness of breath.   Past Medical History:  Diagnosis Date   Back pain    Chronic back pain    GERD (gastroesophageal reflux disease)    Headache(784.0)    History of COVID-19    History of kidney stones    History of nephrolithiasis    Hypertension    Plantar fasciitis    PSVT (paroxysmal supraventricular tachycardia) (HCC)    Pulmonary nodules    Sleep apnea    Ulcer 1980's   peptic ulcer disease    Patient Active Problem List   Diagnosis Date Noted   Polyp of ascending colon    Infected cyst of skin 12/18/2021   Head pain 12/18/2021   PSVT (paroxysmal supraventricular tachycardia) ([place]) 10/01/2021   Thyroid nodule 09/23/2021   Rash of foot 09/23/2021   Chest pain 04/01/2021   Localized skin mass, lump, or swelling 10/28/2020   Cyst of skin of breast 09/25/2019   Abnormal serum ACE level 04/08/2018   Eye exam abnormal 08/04/2017   Tinnitus 11/13/2014   History of shingles 04/27/2014   Low back pain 04/20/2014   OSA (obstructive sleep apnea) 09/27/2012   Snoring 06/21/2012   Fatigue 06/20/2012   Colon cancer screening 11/04/2011   Routine general medical examination at a health care facility 10/22/2011   Vitamin D deficiency  07/02/2010   Palpitations 04/07/2010   HYPOKALEMIA 07/10/2009   Allergic rhinitis 04/03/2008   Essential hypertension 01/25/2008   G E R D 07/05/2007   H/O peptic ulcer 07/05/2007   Headache, chronic daily 07/05/2007   NEPHROLITHIASIS, HX OF 07/05/2007    Past Surgical History:  Procedure Laterality Date   ABDOMINAL HYSTERECTOMY  2002   CHOLECYSTECTOMY  1984   COLONOSCOPY  12/18/2011   Normal   COLONOSCOPY WITH PROPOFOL N/A 12/23/2021   Procedure: COLONOSCOPY WITH PROPOFOL;  Surgeon:  Lucilla Lame, MD;  Location: [facility] ENDOSCOPY;  Service: Endoscopy;  Laterality: N/A;   IR ANGIO EXTERNAL CAROTID SEL EXT CAROTID BILAT MOD SED  11/15/2018   IR ANGIO INTRA EXTRACRAN SEL INTERNAL CAROTID BILAT MOD SED  11/15/2018   IR ANGIO VERTEBRAL SEL VERTEBRAL UNI R MOD SED  11/15/2018   IR US GUIDE VASC ACCESS RIGHT  11/15/2018   LIPOMA EXCISION Left 05/20/2021   Procedure: EXCISION LIPOMA OF LEFT BUTTOCK;  Surgeon: Johnathan Hausen, MD;  Location: [facility];  Service: General;  Laterality: Left;   OOPHORECTOMY      OB History     Gravida  3   Para  3   Term  3   Preterm      AB      Living  3      SAB      IAB      Ectopic      Multiple      Live Births               Home Medications    Prior to Admission medications   Medication Sig Start Date End Date Taking? Authorizing Provider  amLODipine (NORVASC) 5 MG tablet Take 1 tablet (5 mg total) by mouth daily. 09/23/21   Tower, Wynelle Fanny, MD  amoxicillin-clavulanate (AUGMENTIN) 875-125 MG tablet Take 1 tablet by mouth 2 (two) times daily. 12/18/21   Tower, Wynelle Fanny, MD  Cholecalciferol (VITAMIN D3) 2000 UNITS capsule Take 2,000 Units by mouth daily.    [provider]  diclofenac Sodium (VOLTAREN) 1 % GEL Apply 2 g topically 4 (four) times daily as needed. 09/23/21   Tower, Wynelle Fanny, MD  gabapentin (NEURONTIN) 300 MG capsule Take 300 mg by mouth 3 (three) times daily as needed (pain).    [provider]  ketoconazole (NIZORAL) 2 % cream Apply 1 application topically daily. To affected area/ foot 09/23/21   Tower, Wynelle Fanny, MD  metoprolol tartrate (LOPRESSOR) 25 MG tablet Take 0.5 tablets (12.5 mg total) by mouth 2 (two) times daily. 10/01/21   End, Harrell Gave, MD  omeprazole (PRILOSEC) 20 MG capsule TAKE 1 CAPSULE (20 MG TOTAL) BY MOUTH DAILY AS NEEDED. 10/17/20   Tower, Wynelle Fanny, MD  tiZANidine (ZANAFLEX) 4 MG capsule Take 4 mg by mouth 3 (three) times daily as needed for muscle spasms.    [provider]    Family History Family History  Problem Relation Age of Onset   Stroke Father    Hypertension Father    Diabetes Father    Cancer Brother        stomach tumor    Social History Social History   Tobacco Use   Smoking status: Never   Smokeless tobacco: Never  Vaping Use   Vaping Use: Never used  Substance Use Topics   Alcohol use: Yes    Comment: occas.   Drug use: No     Allergies   Atenolol and Hydrocodone  Review of Systems Review of Systems  Constitutional:  Negative for fatigue and fever.  HENT:  Negative for trouble swallowing and voice change.   Respiratory:  Negative for shortness of breath.   Cardiovascular:  Positive for palpitations. Negative for chest pain and leg swelling.  Musculoskeletal:  Negative for neck stiffness.  Neurological:  Positive for dizziness and headaches. Negative for tremors, seizures, syncope, facial asymmetry, speech difficulty, weakness, light-headedness and numbness.    Physical Exam Triage Vital Signs ED Triage Vitals  Enc Vitals Group     BP 02/06/22 1404 (!) 148/85     Pulse Rate 02/06/22 1404 76     Resp 02/06/22 1404 16     Temp 02/06/22 1404 98.7 F (37.1 C)     Temp Source 02/06/22 1404 Oral     SpO2 02/06/22 1404 97 %     Weight --      Height --      Head Circumference --      Peak Flow --      Pain Score 02/06/22 1414 5     Pain Loc --      Pain Edu? --      Excl. in [place]? --    No data found.  Updated Vital Signs BP 131/84 (BP Location: Left Arm)    Pulse 76    Temp 98.7 F (37.1 C) (Oral)    Resp 16    SpO2 97%   Visual Acuity Right Eye Distance:   Left Eye Distance:   Bilateral Distance:    Right Eye Near:   Left Eye Near:    Bilateral Near:     Physical Exam Vitals and nursing note reviewed.  Constitutional:      General: She is not in acute distress.    Appearance: She is well-developed. She is obese. She is not ill-appearing, toxic-appearing or diaphoretic.  HENT:      Head: Normocephalic and atraumatic.     Right Ear: Tympanic membrane, ear canal and external ear normal.     Left Ear: Tympanic membrane, ear canal and external ear normal.     Nose: Nose normal.     Mouth/Throat:     Mouth: Mucous membranes are moist.     Pharynx: No oropharyngeal exudate or posterior oropharyngeal erythema.  Eyes:     General: No visual field deficit or scleral icterus.    Extraocular Movements: Extraocular movements intact.     Right eye: Normal extraocular motion and no nystagmus.     Left eye: Normal extraocular motion and no nystagmus.     Conjunctiva/sclera: Conjunctivae normal.     Pupils: Pupils are equal, round, and reactive to light. Pupils are equal.     Right eye: Pupil is round and reactive.     Left eye: Pupil is round and reactive.  Neck:     Meningeal: Kernig's sign absent.  Cardiovascular:     Rate and Rhythm: Normal rate and regular rhythm.     Heart sounds: Normal heart sounds. No murmur heard.   No friction rub. No gallop.  Pulmonary:     Effort: Pulmonary effort is normal. No respiratory distress.     Breath sounds: Normal breath sounds. No stridor. No wheezing, rhonchi or rales.  Chest:     Chest wall: No tenderness.  Abdominal:     Palpations: Abdomen is soft.     Tenderness: There is no abdominal tenderness.  Musculoskeletal:        General: No swelling  or tenderness. Normal range of motion.     Cervical back: Normal range of motion and neck supple. No rigidity or tenderness.  Lymphadenopathy:     Cervical: No cervical adenopathy.  Skin:    General: Skin is warm and dry.     Capillary Refill: Capillary refill takes less than 2 seconds.     Coloration: Skin is not pale.     Findings: No rash.  Neurological:     General: No focal deficit present.     Mental Status: She is alert and oriented to person, place, and time. Mental status is at baseline.     Cranial Nerves: Cranial nerves 2-12 are intact. No cranial nerve deficit, dysarthria  or facial asymmetry.     Sensory: Sensation is intact. No sensory deficit.     Motor: Motor function is intact. No weakness, tremor, atrophy or pronator drift.     Coordination: Coordination is intact. Romberg sign negative. Coordination normal. Finger-Nose-Finger Test normal.     Gait: Gait is intact. Gait normal.     Deep Tendon Reflexes: Reflexes are normal and symmetric. Reflexes normal.  Psychiatric:        Mood and Affect: Mood normal.        Behavior: Behavior normal.     UC Treatments / Results  Labs (all labs ordered are listed, but only abnormal results are displayed) Labs Reviewed - No data to display  EKG   Radiology No results found.  Procedures Procedures (including critical care time)  Medications Ordered in UC Medications - No data to display  Initial Impression / Assessment and Plan / UC Course  I have reviewed the triage vital signs and the nursing notes.  Pertinent labs & imaging results that were available during my care of the patient were reviewed by me and considered in my medical decision making (see chart for details).     Essential HTN - rechecked in office and BP normal. It sounds as though her BP is elevated after exercising and possibly not responding to the metoprolol as well as previous. Recommended pt reach out to cardiology to see if switching to metoprolol ER may be of benefit. In office, BP at goal reading. Headache - pt stating sx are on L parietal region. Complete and thorough cardiac and neurological exam completed in office without any objective findings. I discussed with pt that should the headache persist or continue, she should go to the ER for imaging. Avoid exercising over the next week until f/u with PCP. Tylenol as needed for headache. Red flag symptoms that warrant ER visit reviewed.   Final Clinical Impressions(s) / UC Diagnoses   Final diagnoses:  Essential hypertension  Acute nonintractable headache, unspecified headache  type     Discharge Instructions      Your repeat BP was normal. I question if you would benefit from changing to ER metoprolol succinate in place or tartrate to diminish the peaks and troughs of your home BP readings. Please call your cardiologist to discuss BP concerns. Please avoid exercise for one week. If your headache persists or worsens, please head to the ER for a further workup including CT scan. You make take 1000mg  tylenol (acetaminophen) up to three times daily as needed    ED Prescriptions   None    PDMP not reviewed this encounter.   Chaney Malling, [place] 02/06/22 2051

## 2022-02-06 NOTE — Telephone Encounter (Signed)
Per chart review tab pt is at [facility] in [place] now. ?

## 2022-02-06 NOTE — Telephone Encounter (Signed)
Patient was transferred to my line for access nurse states that her blood pressure was elevated. Was advised that she needed to be seen today. No open appointments patient was advised to go to walk in for evaluation. She has agreed and will go now.  ?

## 2022-02-06 NOTE — Telephone Encounter (Signed)
[facility] [facility] - Client ?TELEPHONE ADVICE RECORD ?AccessNurse? ?Patient ?Name: ?Mindy MCDU ?Long ?Gender: Female ?DOB: 1960/12/31 ?Age: 61 Y 44 M 15 D ?Return ?Phone ?Number: ?[id number] ?(Primary) ?Address: ?City/ ?State/ ?Zip: ?[place] ?[address] ?Client [place] [facility] - Client ?Client Site [place] - Day ?Provider Tower, Roque Lias - MD ?Contact Type Call ?Who Is Calling Patient / Member / Family / Caregiver ?Call Type Triage / Clinical ?Relationship To Patient Self ?Return Phone Number [phone] (Primary) ?Chief Complaint Headache ?Reason for Call Symptomatic / Request for Health Information ?Initial Comment Caller states her blood pressure is 155/100 and she ?took her medication and it is 112/75 she has severe ?headaches. ?Translation No ?Nurse Assessment ?Nurse: Fredderick Phenix, RN, Lelan Pons Date/Time Eilene Ghazi Time): 02/06/2022 12:29:14 PM ?Confirm and document reason for call. If ?symptomatic, describe symptoms. ?---Caller states she has bp of 154/95. She has been ?getting headaches and bp is going up in the evenings. ?This started on Wednesday. ?Does the patient have any new or worsening ?symptoms? ---Yes ?Will a triage be completed? ---Yes ?Related visit to physician within the last 2 weeks? ---No ?Does the PT have any chronic conditions? (i.e. ?diabetes, asthma, this includes High risk factors for ?pregnancy, etc.) ?---Yes ?List chronic conditions. ---HTN ?Is this a behavioral health or substance abuse call? ---No ?Guidelines ?Guideline Title Affirmed Question Affirmed Notes Nurse Date/Time (Eastern ?Time) ?Blood Pressure - ?High ?[1] Taking BP ?medications AND ?[2] feels is having ?side effects (e.g., ?impotence, cough, ?dizzy upon standing) ?Fredderick Phenix, RN, Lelan Pons 02/06/2022 12:32:23 ?PM ?Disp. Time (Eastern ?Time) Disposition Final User ?02/06/2022 12:19:48 PM Attempt made - message left Fredderick Phenix, RN, Lelan Pons ?PLEASE NOTE: All timestamps contained within this  report are represented as [place] Standard Time. ?CONFIDENTIALTY NOTICE: This fax transmission is intended only for the addressee. It contains information that is legally privileged, confidential or ?otherwise protected from use or disclosure. If you are not the intended recipient, you are strictly prohibited from reviewing, disclosing, copying using ?or disseminating any of this information or taking any action in reliance on or regarding this information. If you have received this fax in error, please ?notify us immediately by telephone so that we can arrange for its return to [place]. Phone: [phone], Toll-Free: [phone], Fax: [phone] ?Page: 2 of 2 ?Call Id: [id number] ?02/06/2022 12:38:31 PM SEE PCP WITHIN 3 DAYS Yes Fredderick Phenix, RN, Lelan Pons ?Caller Disagree/Comply Comply ?Caller Understands Yes ?PreDisposition Did not know what to do ?Care Advice Given Per Guideline ?SEE PCP WITHIN 3 DAYS: * You need to be seen within 2 or 3 days. MEDICATION SIDE EFFECTS: CALL BACK IF: CARE ?ADVICE given per High Blood Pressure (Adult) guideline. HIGH BLOOD PRESSURE MEDICINES: ?Referrals ?REFERRED TO PCP OFFIC ?

## 2022-02-06 NOTE — ED Triage Notes (Signed)
Pt here with HTN, headache and dizziness x 2 days. Pt will take her metoprolol and it goes down, but a few hours before her next dose, the BP goes back up and she has dizziness and headache again.  ?

## 2022-02-06 NOTE — Discharge Instructions (Addendum)
Your repeat BP was normal. ?I question if you would benefit from changing to ER metoprolol succinate in place or tartrate to diminish the peaks and troughs of your home BP readings. ?Please call your cardiologist to discuss BP concerns. ?Please avoid exercise for one week. ?If your headache persists or worsens, please head to the ER for a further workup including CT scan. ?You make take 1000mg  tylenol (acetaminophen) up to three times daily as needed ?

## 2022-03-09 ENCOUNTER — Telehealth: Payer: Self-pay | Admitting: Gastroenterology

## 2022-03-09 NOTE — Telephone Encounter (Signed)
Patient is wondering when she needs a repeat colonoscopy. ?

## 2022-03-19 ENCOUNTER — Other Ambulatory Visit: Payer: Self-pay | Admitting: Family Medicine

## 2022-03-19 NOTE — Telephone Encounter (Signed)
Looks like cardiology handled this Rx last will route to them for review.  ?

## 2022-05-03 IMAGING — CR DG CHEST 2V
2 series · 2 of 2 positions shown · non-contrast
Comparison: None.

CLINICAL DATA: Shortness of breath.

EXAM:
CHEST - 2 VIEW

[chest pa]
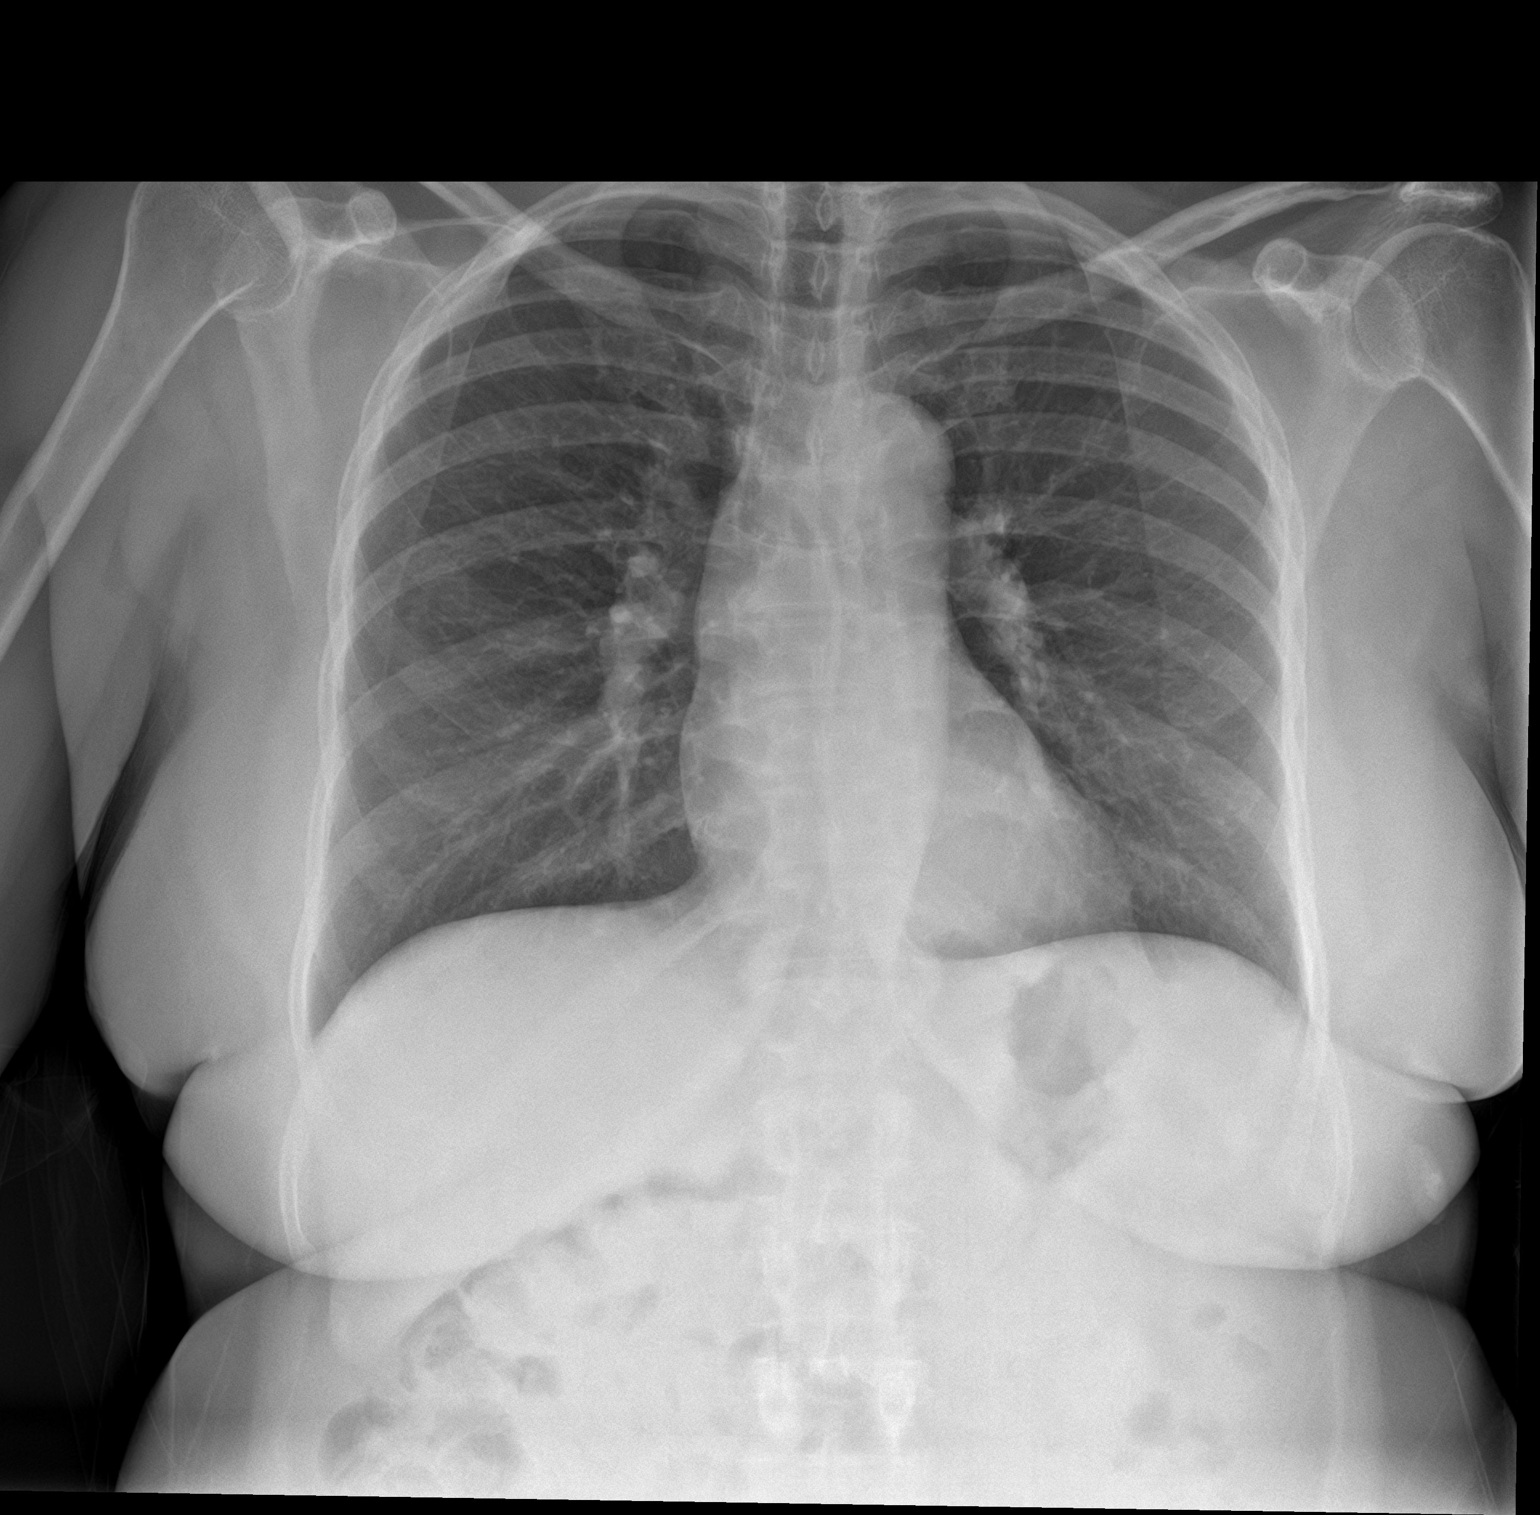

[chest lat]
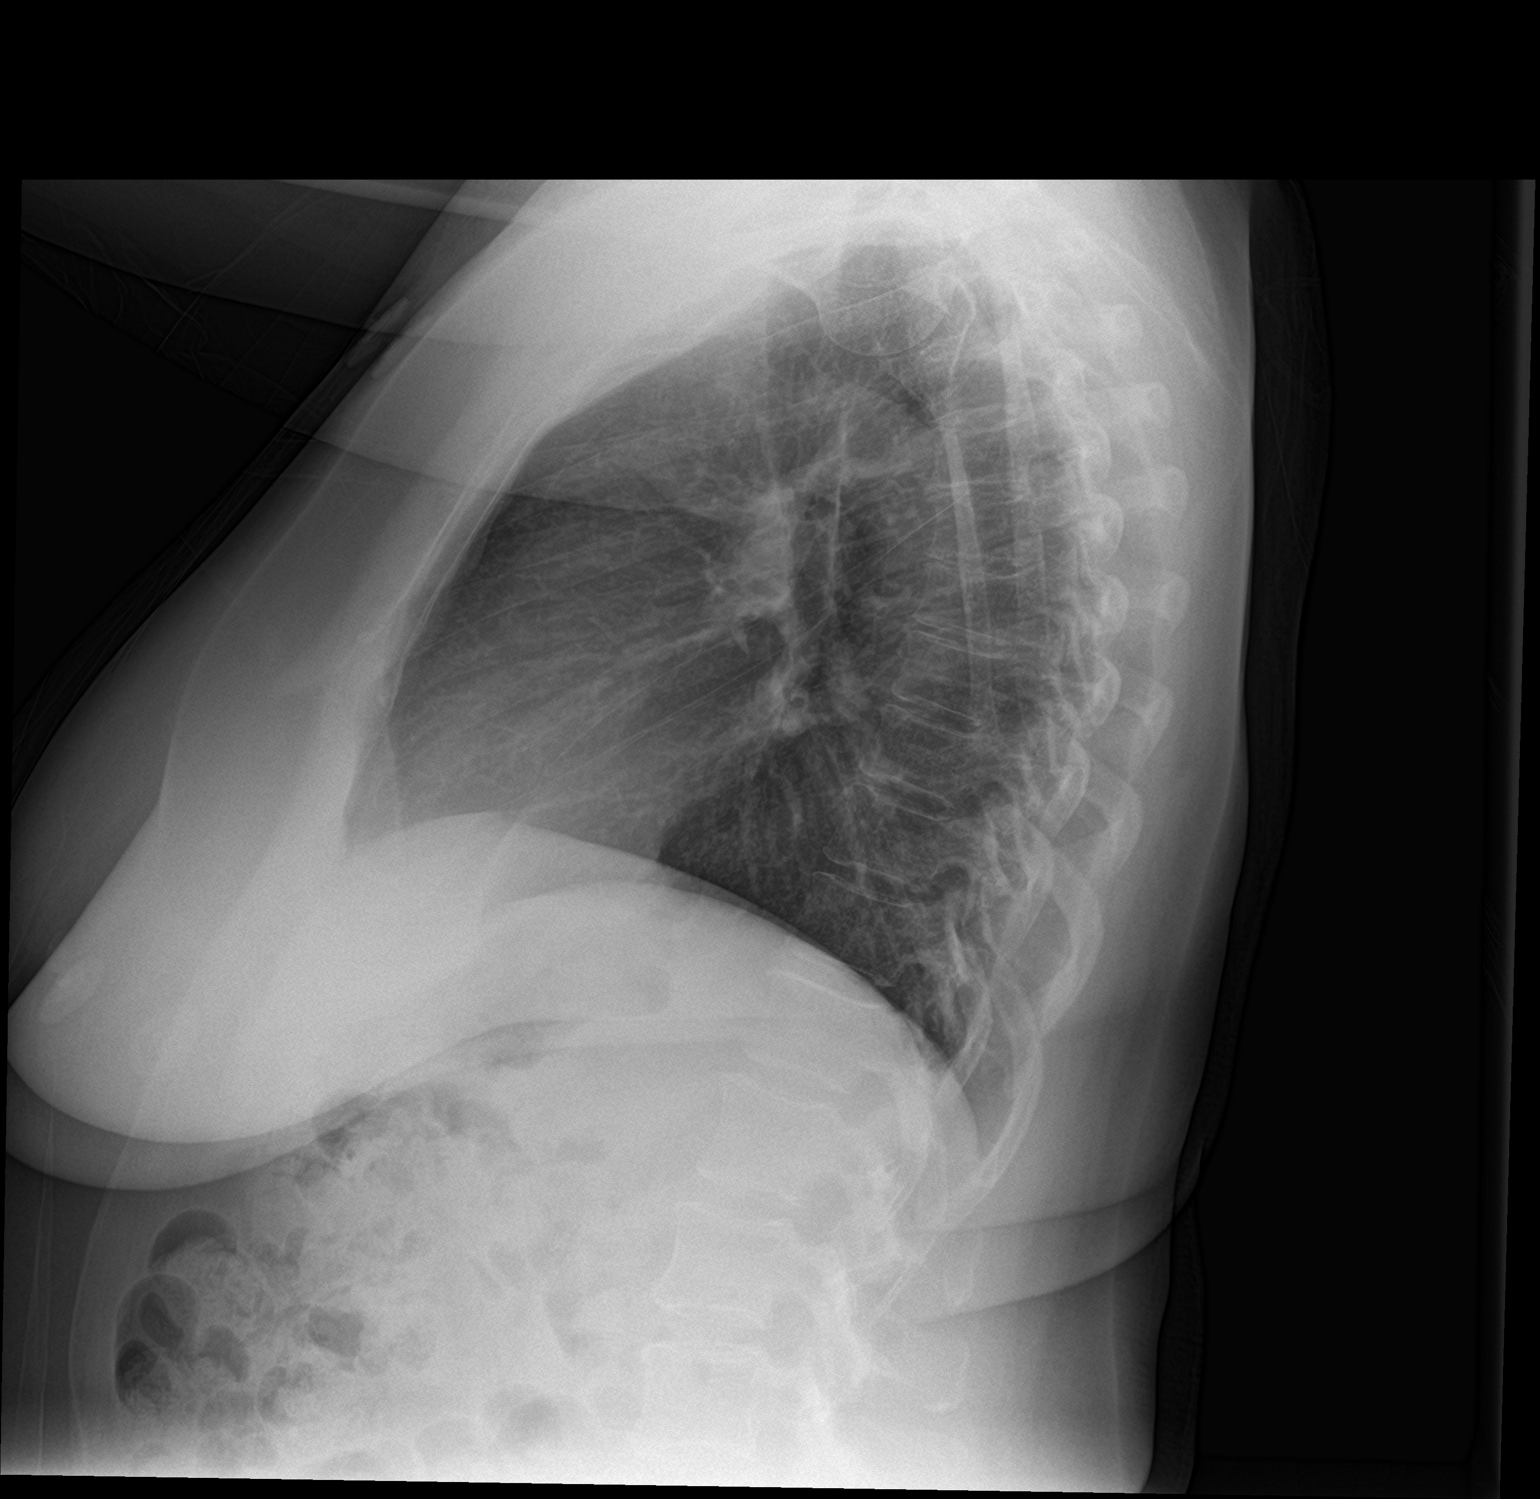

[2 of 2 positions shown; findings below may reference images not displayed]

FINDINGS: The heart size and mediastinal contours are within normal limits.
Both lungs are clear. No pneumothorax or pleural effusion is noted.
The visualized skeletal structures are unremarkable.
IMPRESSION: No active cardiopulmonary disease.

## 2022-06-10 ENCOUNTER — Other Ambulatory Visit: Payer: Self-pay | Admitting: Family Medicine

## 2022-06-10 DIAGNOSIS — Z1231 Encounter for screening mammogram for malignant neoplasm of breast: Secondary | ICD-10-CM

## 2022-07-20 ENCOUNTER — Ambulatory Visit
Admission: RE | Admit: 2022-07-20 | Discharge: 2022-07-20 | Disposition: A | Discharge status: Home / Self Care | Payer: BC Managed Care – PPO | Source: Ambulatory Visit | Attending: Family Medicine | Admitting: Family Medicine

## 2022-07-20 DIAGNOSIS — Z1231 Encounter for screening mammogram for malignant neoplasm of breast: Secondary | ICD-10-CM | POA: Diagnosis not present

## 2022-07-21 ENCOUNTER — Telehealth: Payer: Self-pay | Admitting: Family Medicine

## 2022-07-21 NOTE — Telephone Encounter (Signed)
Patient called to return call.  

## 2022-07-21 NOTE — Telephone Encounter (Signed)
  Encourage patient to contact the pharmacy for refills or they can request refills through [facility]:  Please schedule appointment if longer than 1 year  NEXT APPOINTMENT DATE:  MEDICATION:tiZANidine (ZANAFLEX) 4 MG capsule  Is the patient out of medication?   PHARMACY:[id number] [place], [place]  Let patient know to contact pharmacy at the end of the day to make sure medication is ready.  Please notify patient to allow 48-72 hours to process  CLINICAL FILLS OUT ALL BELOW:   LAST REFILL:  QTY:  REFILL DATE:    OTHER COMMENTS:    Okay for refill?  Please advise

## 2022-07-21 NOTE — Telephone Encounter (Signed)
Tried to call pt back vm was full .

## 2022-07-21 NOTE — Telephone Encounter (Signed)
Pt called in would like to schedules to get shingles vaccine . Please advise

## 2022-07-22 NOTE — Telephone Encounter (Signed)
Tried to call pt back her vm is full

## 2022-07-22 NOTE — Telephone Encounter (Signed)
Pt called and wanted to know if she could get shingles vaccines. She was told that she couldn't get the shingles and flu together, pt that f she wanted to  have the shingle injection now she could and  she will have 2-3 for 2nd injection., pt was agreeable. Made appt for pt to come next week to have this done.

## 2022-07-29 ENCOUNTER — Ambulatory Visit (INDEPENDENT_AMBULATORY_CARE_PROVIDER_SITE_OTHER): Payer: BC Managed Care – PPO

## 2022-07-29 DIAGNOSIS — Z23 Encounter for immunization: Secondary | ICD-10-CM

## 2022-07-29 NOTE — Progress Notes (Signed)
Per orders of Dr. Glori Bickers, 1st injection of shingrix given by Loreen Freud. Patient tolerated injection well.

## 2022-09-10 ENCOUNTER — Other Ambulatory Visit: Payer: Self-pay | Admitting: Family Medicine

## 2022-09-14 ENCOUNTER — Telehealth: Payer: Self-pay | Admitting: Family Medicine

## 2022-09-14 DIAGNOSIS — M545 Low back pain, unspecified: Secondary | ICD-10-CM | POA: Diagnosis not present

## 2022-09-14 DIAGNOSIS — M25551 Pain in right hip: Secondary | ICD-10-CM | POA: Diagnosis not present

## 2022-09-14 DIAGNOSIS — I10 Essential (primary) hypertension: Secondary | ICD-10-CM

## 2022-09-14 DIAGNOSIS — E559 Vitamin D deficiency, unspecified: Secondary | ICD-10-CM

## 2022-09-14 NOTE — Telephone Encounter (Signed)
-----   Message from Ellamae Sia sent at 09/11/2022  2:46 PM EDT ----- Regarding: Lab orders for Thursday, 10.12.23 Patient is scheduled for CPX labs, please order future labs, Thanks , [name]

## 2022-09-17 ENCOUNTER — Other Ambulatory Visit: Payer: Self-pay | Admitting: Family Medicine

## 2022-09-17 ENCOUNTER — Other Ambulatory Visit (INDEPENDENT_AMBULATORY_CARE_PROVIDER_SITE_OTHER): Payer: BC Managed Care – PPO

## 2022-09-17 ENCOUNTER — Other Ambulatory Visit: Payer: Self-pay | Admitting: Internal Medicine

## 2022-09-17 DIAGNOSIS — I10 Essential (primary) hypertension: Secondary | ICD-10-CM | POA: Diagnosis not present

## 2022-09-17 DIAGNOSIS — E559 Vitamin D deficiency, unspecified: Secondary | ICD-10-CM | POA: Diagnosis not present

## 2022-09-17 LAB — CBC WITH DIFFERENTIAL/PLATELET
Basophils Absolute: 0 10*3/uL (ref 0.0–0.1)
Basophils Relative: 0.5 % (ref 0.0–3.0)
Eosinophils Absolute: 0.2 10*3/uL (ref 0.0–0.7)
Eosinophils Relative: 4.6 % (ref 0.0–5.0)
HCT: 42.5 % (ref 36.0–46.0)
Hemoglobin: 13.9 g/dL (ref 12.0–15.0)
Lymphocytes Relative: 48.3 % — ABNORMAL HIGH (ref 12.0–46.0)
Lymphs Abs: 2.5 10*3/uL (ref 0.7–4.0)
MCHC: 32.7 g/dL (ref 30.0–36.0)
MCV: 78 fl (ref 78.0–100.0)
Monocytes Absolute: 0.3 10*3/uL (ref 0.1–1.0)
Monocytes Relative: 5.1 % (ref 3.0–12.0)
Neutro Abs: 2.1 10*3/uL (ref 1.4–7.7)
Neutrophils Relative %: 41.5 % — ABNORMAL LOW (ref 43.0–77.0)
Platelets: 240 10*3/uL (ref 150.0–400.0)
RBC: 5.45 Mil/uL — ABNORMAL HIGH (ref 3.87–5.11)
RDW: 14.5 % (ref 11.5–15.5)
WBC: 5.2 10*3/uL (ref 4.0–10.5)

## 2022-09-17 LAB — LIPID PANEL
Cholesterol: 218 mg/dL — ABNORMAL HIGH (ref 0–200)
HDL: 84 mg/dL (ref >39.00)
LDL Cholesterol: 122 mg/dL — ABNORMAL HIGH (ref 0–99)
NonHDL: 133.98
Total CHOL/HDL Ratio: 3
Triglycerides: 59 mg/dL (ref 0.0–149.0)
VLDL: 11.8 mg/dL (ref 0.0–40.0)

## 2022-09-17 LAB — COMPREHENSIVE METABOLIC PANEL
ALT: 10 U/L (ref 0–35)
AST: 15 U/L (ref 0–37)
Albumin: 4 g/dL (ref 3.5–5.2)
Alkaline Phosphatase: 101 U/L (ref 39–117)
BUN: 12 mg/dL (ref 6–23)
CO2: 25 mEq/L (ref 19–32)
Calcium: 9.4 mg/dL (ref 8.4–10.5)
Chloride: 104 mEq/L (ref 96–112)
Creatinine, Ser: 0.87 mg/dL (ref 0.40–1.20)
GFR: 70.96 mL/min (ref >60.00)
Glucose, Bld: 91 mg/dL (ref 70–99)
Potassium: 3.7 mEq/L (ref 3.5–5.1)
Sodium: 138 mEq/L (ref 135–145)
Total Bilirubin: 0.5 mg/dL (ref 0.2–1.2)
Total Protein: 7.3 g/dL (ref 6.0–8.3)

## 2022-09-17 LAB — VITAMIN D 25 HYDROXY (VIT D DEFICIENCY, FRACTURES): VITD: 36.55 ng/mL (ref 30.00–100.00)

## 2022-09-17 LAB — TSH: TSH: 1.72 u[IU]/mL (ref 0.35–5.50)

## 2022-09-23 DIAGNOSIS — H35033 Hypertensive retinopathy, bilateral: Secondary | ICD-10-CM | POA: Diagnosis not present

## 2022-09-24 ENCOUNTER — Ambulatory Visit (INDEPENDENT_AMBULATORY_CARE_PROVIDER_SITE_OTHER): Payer: BC Managed Care – PPO | Admitting: Family Medicine

## 2022-09-24 ENCOUNTER — Encounter: Payer: Self-pay | Admitting: Family Medicine

## 2022-09-24 VITALS — BP 126/80 | HR 118 | Temp 98.0°F | Ht 62.25 in | Wt 169.5 lb

## 2022-09-24 DIAGNOSIS — R519 Headache, unspecified: Secondary | ICD-10-CM

## 2022-09-24 DIAGNOSIS — L819 Disorder of pigmentation, unspecified: Secondary | ICD-10-CM

## 2022-09-24 DIAGNOSIS — Z0000 Encounter for general adult medical examination without abnormal findings: Secondary | ICD-10-CM | POA: Diagnosis not present

## 2022-09-24 DIAGNOSIS — G8929 Other chronic pain: Secondary | ICD-10-CM

## 2022-09-24 DIAGNOSIS — G4733 Obstructive sleep apnea (adult) (pediatric): Secondary | ICD-10-CM | POA: Diagnosis not present

## 2022-09-24 DIAGNOSIS — Z1211 Encounter for screening for malignant neoplasm of colon: Secondary | ICD-10-CM | POA: Diagnosis not present

## 2022-09-24 DIAGNOSIS — K5900 Constipation, unspecified: Secondary | ICD-10-CM | POA: Insufficient documentation

## 2022-09-24 DIAGNOSIS — E559 Vitamin D deficiency, unspecified: Secondary | ICD-10-CM

## 2022-09-24 DIAGNOSIS — M5440 Lumbago with sciatica, unspecified side: Secondary | ICD-10-CM

## 2022-09-24 DIAGNOSIS — E041 Nontoxic single thyroid nodule: Secondary | ICD-10-CM

## 2022-09-24 DIAGNOSIS — Z23 Encounter for immunization: Secondary | ICD-10-CM

## 2022-09-24 DIAGNOSIS — I10 Essential (primary) hypertension: Secondary | ICD-10-CM | POA: Diagnosis not present

## 2022-09-24 MED ORDER — TIZANIDINE HCL 4 MG PO CAPS: 4.0000 mg | ORAL_CAPSULE | Freq: Three times a day (TID) | ORAL | 3 refills | Status: DC | PRN

## 2022-09-24 NOTE — Assessment & Plan Note (Signed)
Reviewed health habits including diet and exercise and skin cancer prevention Reviewed appropriate screening tests for age  Also reviewed health mt list, fam hx and immunization status , as well as social and family history   See HPI Labs reviewed  Flu shot given Will return for shingix vaccine after 09/28/22 Mammogram utd 07/2022 Colonoscopy utd 12/2021 with 5 y recall Continues vit D, no falls or fx

## 2022-09-24 NOTE — Assessment & Plan Note (Signed)
Recommended trial of fluids and fiber and miralax Colonoscopy utd from 12/2021

## 2022-09-24 NOTE — Assessment & Plan Note (Signed)
Was determined not to need cpap, thinks she is doing well

## 2022-09-24 NOTE — Assessment & Plan Note (Signed)
Takes tizanidine prn  Refilled today  Caution of sedation

## 2022-09-24 NOTE — Assessment & Plan Note (Signed)
Level in mid 30s Vitamin D level is therapeutic with current supplementation Disc importance of this to bone and overall health

## 2022-09-24 NOTE — Progress Notes (Signed)
Subjective:    Patient ID: Mindy Long, female    DOB: 1961/07/24, 61 y.o.   MRN: [id number]  HPI Here for health maintenance exam and to review chronic medical problems    Wt Readings from Last 3 Encounters:  09/24/22 169 lb 8 oz (76.9 kg)  12/23/21 160 lb 5.1 oz (72.7 kg)  12/18/21 168 lb 4 oz (76.3 kg)   30.75 kg/m  Doing ok overall   Had a covid shot then sharp pain in L occiput on and off after that  Did not know if related  Does not hurt to press on  Not stressed or tense   Constipated recently  Took stool softeners -helps some of the time   Has a lump in R neck  Long time  No pain / a little tender   No dental issues  No ear pain  No uri symptoms    Immunization History  Administered Date(s) Administered   Influenza Split 11/04/2011   Influenza,inj,Quad PF,6+ Mos 11/27/2013, 11/13/2014, 09/23/2016, 08/15/2018, 08/22/2019, 08/22/2020, 09/23/2021   Moderna Covid-19 Vaccine Bivalent Booster 74yr & up 12/06/2021   PFIZER Comirnaty(Gray Top)Covid-19 Tri-Sucrose Vaccine 06/13/2021, 08/30/2022   PFIZER(Purple Top)SARS-COV-2 Vaccination 02/29/2020, 03/25/2020   Td 06/20/2009   Tdap 08/22/2019   Zoster Recombinat (Shingrix) 07/29/2022   Health Maintenance Due  Topic Date Due   INFLUENZA VACCINE  07/07/2022   Zoster Vaccines- Shingrix (2 of 2) 09/23/2022   Will be due for shingrix after 8/23  Flu shot  : today   Shingrix had first on 07/29/22  Plans to come back for it   Mammogram 07/2022 Self breast exam: no lumps   Colonoscopy 12/2021  5 y recall    Ca and D D level is 36.55   HTN bp is stable today  No cp or palpitations or headaches or edema  No side effects to medicines  BP Readings from Last 3 Encounters:  09/24/22 126/80  02/06/22 131/84  12/23/21 (!) 88/59     Pulse Readings from Last 3 Encounters:  09/24/22 (!) 118  02/06/22 76  12/23/21 88    Metoprolol 25 mg 1/2 pill bid  Amlodipine 5 mg daily    Lab Results   Component Value Date   CREATININE 0.87 09/17/2022   BUN 12 09/17/2022   NA 138 09/17/2022   K 3.7 09/17/2022   CL 104 09/17/2022   CO2 25 09/17/2022    Cardiology care: up to date   OSA: not bad enough for cpap  Sleep is fair   GERD Omeprazole 20 mg daily   Thyroid nodule Lab Results  Component Value Date   TSH 1.72 09/17/2022   Thyroid u[place]- still needs this     Does not think she got call  No changes   Had CT ca score/was 0   Cholesterol Lab Results  Component Value Date   CHOL 218 (H) 09/17/2022   CHOL 213 (H) 09/18/2021   CHOL 223 (H) 08/19/2020   Lab Results  Component Value Date   HDL 84.00 09/17/2022   HDL 83.00 09/18/2021   HDL 89.70 08/19/2020   Lab Results  Component Value Date   LDLCALC 122 (H) 09/17/2022   LDLCALC 117 (H) 09/18/2021   LDLCALC 122 (H) 08/19/2020   Lab Results  Component Value Date   TRIG 59.0 09/17/2022   TRIG 66.0 09/18/2021   TRIG 54.0 08/19/2020   Lab Results  Component Value Date   CHOLHDL 3 09/17/2022   CHOLHDL  3 09/18/2021   CHOLHDL 2 08/19/2020   Lab Results  Component Value Date   LDLDIRECT 126.7 10/23/2011   LDLDIRECT 132.9 06/20/2009   LDLDIRECT 109.4 01/25/2008   Diet could be better  Eats out more  No red meat  Some fried foods  Needs to cut ice cream   Other labs Lab Results  Component Value Date   WBC 5.2 09/17/2022   HGB 13.9 09/17/2022   HCT 42.5 09/17/2022   MCV 78.0 09/17/2022   PLT 240.0 09/17/2022   Lab Results  Component Value Date   TSH 1.72 09/17/2022   Lab Results  Component Value Date   ALT 10 09/17/2022   AST 15 09/17/2022   ALKPHOS 101 09/17/2022   BILITOT 0.5 09/17/2022    Takes tizanadine for back pain prn / low  When sore /sometimes after working out  Triggered occ    Patient Active Problem List   Diagnosis Date Noted   Constipation 09/24/2022   Skin hypopigmentation 09/24/2022   Polyp of ascending colon    Head pain 12/18/2021   PSVT (paroxysmal  supraventricular tachycardia) 10/01/2021   Thyroid nodule 09/23/2021   Abnormal serum ACE level 04/08/2018   Eye exam abnormal 08/04/2017   Tinnitus 11/13/2014   History of shingles 04/27/2014   Low back pain 04/20/2014   OSA (obstructive sleep apnea) 09/27/2012   Snoring 06/21/2012   Fatigue 06/20/2012   Colon cancer screening 11/04/2011   Routine general medical examination at a health care facility 10/22/2011   Vitamin D deficiency 07/02/2010   Palpitations 04/07/2010   HYPOKALEMIA 07/10/2009   Allergic rhinitis 04/03/2008   Essential hypertension 01/25/2008   G E R D 07/05/2007   H/O peptic ulcer 07/05/2007   Headache, chronic daily 07/05/2007   NEPHROLITHIASIS, HX OF 07/05/2007   Past Medical History:  Diagnosis Date   Back pain    Chronic back pain    GERD (gastroesophageal reflux disease)    Headache(784.0)    History of COVID-19    History of kidney stones    History of nephrolithiasis    Hypertension    Plantar fasciitis    PSVT (paroxysmal supraventricular tachycardia)    Pulmonary nodules    Sleep apnea    Ulcer 1980's   peptic ulcer disease   Past Surgical History:  Procedure Laterality Date   ABDOMINAL HYSTERECTOMY  2002   CHOLECYSTECTOMY  1984   COLONOSCOPY  12/18/2011   Normal   COLONOSCOPY WITH PROPOFOL N/A 12/23/2021   Procedure: COLONOSCOPY WITH PROPOFOL;  Surgeon: Lucilla Lame, MD;  Location: [facility];  Service: Endoscopy;  Laterality: N/A;   IR ANGIO EXTERNAL CAROTID SEL EXT CAROTID BILAT MOD SED  11/15/2018   IR ANGIO INTRA EXTRACRAN SEL INTERNAL CAROTID BILAT MOD SED  11/15/2018   IR ANGIO VERTEBRAL SEL VERTEBRAL UNI R MOD SED  11/15/2018   IR US GUIDE VASC ACCESS RIGHT  11/15/2018   LIPOMA EXCISION Left 05/20/2021   Procedure: EXCISION LIPOMA OF LEFT BUTTOCK;  Surgeon: Johnathan Hausen, MD;  Location: [facility];  Service: General;  Laterality: Left;   OOPHORECTOMY     Social History   Tobacco Use   Smoking status: Never   Smokeless  tobacco: Never  Vaping Use   Vaping Use: Never used  Substance Use Topics   Alcohol use: Yes    Comment: occas.   Drug use: No   Family History  Problem Relation Age of Onset   Stroke Father    Hypertension Father  Diabetes Father    Cancer Brother        stomach tumor   Allergies  Allergen Reactions   Atenolol Cough   Hydrocodone Nausea Only   Current Outpatient Medications on File Prior to Visit  Medication Sig Dispense Refill   amLODipine (NORVASC) 5 MG tablet TAKE 1 TABLET (5 MG TOTAL) BY MOUTH DAILY. 90 tablet 1   Cholecalciferol (VITAMIN D3) 2000 UNITS capsule Take 2,000 Units by mouth daily.     gabapentin (NEURONTIN) 300 MG capsule Take 300 mg by mouth 3 (three) times daily as needed (pain).     ketoconazole (NIZORAL) 2 % cream Apply 1 application topically daily. To affected area/ foot 15 g 1   metoprolol tartrate (LOPRESSOR) 25 MG tablet Take 0.5 tablets (12.5 mg total) by mouth 2 (two) times daily. 30 tablet 0   omeprazole (PRILOSEC) 20 MG capsule TAKE 1 CAPSULE (20 MG TOTAL) BY MOUTH DAILY AS NEEDED. 90 capsule 0   No current facility-administered medications on file prior to visit.     Review of Systems  Constitutional:  Negative for activity change, appetite change, fatigue, fever and unexpected weight change.  HENT:  Negative for congestion, ear pain, rhinorrhea, sinus pressure and sore throat.   Eyes:  Negative for pain, redness and visual disturbance.  Respiratory:  Negative for cough, shortness of breath and wheezing.   Cardiovascular:  Negative for chest pain and palpitations.  Gastrointestinal:  Negative for abdominal pain, blood in stool, constipation and diarrhea.  Endocrine: Negative for polydipsia and polyuria.  Genitourinary:  Negative for dysuria, frequency and urgency.  Musculoskeletal:  Negative for arthralgias, back pain and myalgias.  Skin:  Negative for pallor and rash.  Allergic/Immunologic: Negative for environmental allergies.   Neurological:  Positive for headaches. Negative for dizziness and syncope.  Hematological:  Negative for adenopathy. Does not bruise/bleed easily.  Psychiatric/Behavioral:  Negative for decreased concentration and dysphoric mood. The patient is not nervous/anxious.        Objective:   Physical Exam Constitutional:      General: She is not in acute distress.    Appearance: Normal appearance. She is well-developed. She is obese. She is not ill-appearing or diaphoretic.  HENT:     Head: Normocephalic and atraumatic.     Right Ear: Tympanic membrane, ear canal and external ear normal.     Left Ear: Tympanic membrane, ear canal and external ear normal.     Nose: Nose normal. No congestion.     Mouth/Throat:     Mouth: Mucous membranes are moist.     Pharynx: Oropharynx is clear. No posterior oropharyngeal erythema.  Eyes:     General: No scleral icterus.    Extraocular Movements: Extraocular movements intact.     Conjunctiva/sclera: Conjunctivae normal.     Pupils: Pupils are equal, round, and reactive to light.  Neck:     Thyroid: No thyromegaly.     Vascular: No carotid bruit or JVD.  Cardiovascular:     Rate and Rhythm: Normal rate and regular rhythm.     Pulses: Normal pulses.     Heart sounds: Normal heart sounds.     No gallop.  Pulmonary:     Effort: Pulmonary effort is normal. No respiratory distress.     Breath sounds: Normal breath sounds. No wheezing.     Comments: Good air exch Chest:     Chest wall: No tenderness.  Abdominal:     General: Bowel sounds are normal. There is no distension  or abdominal bruit.     Palpations: Abdomen is soft. There is no mass.     Tenderness: There is no abdominal tenderness.     Hernia: No hernia is present.  Genitourinary:    Comments: Breast exam: No mass, nodules, thickening, tenderness, bulging, retraction, inflamation, nipple discharge or skin changes noted.  No axillary or clavicular LA.     Musculoskeletal:        General:  No tenderness. Normal range of motion.     Cervical back: Normal range of motion and neck supple. No rigidity. No muscular tenderness.     Right lower leg: No edema.     Left lower leg: No edema.     Comments: No kyphosis   Lymphadenopathy:     Cervical: No cervical adenopathy.  Skin:    General: Skin is warm and dry.     Coloration: Skin is not pale.     Findings: No erythema or rash.     Comments: Small scattered discrete areas of hypopigmentation on LEs   Neurological:     Mental Status: She is alert. Mental status is at baseline.     Cranial Nerves: No cranial nerve deficit.     Motor: No abnormal muscle tone.     Coordination: Coordination normal.     Gait: Gait normal.     Deep Tendon Reflexes: Reflexes are normal and symmetric. Reflexes normal.  Psychiatric:        Mood and Affect: Mood normal.        Cognition and Memory: Cognition and memory normal.           Assessment & Plan:   Problem List Items Addressed This Visit       Cardiovascular and Mediastinum   Essential hypertension    bp in fair control at this time  BP Readings from Last 1 Encounters:  09/24/22 126/80  No changes needed Most recent labs reviewed  Disc lifstyle change with low sodium diet and exercise  Plan to continue metoprolol 12.5 mg bid amloidpine 5 mg daily         Respiratory   OSA (obstructive sleep apnea)    Was determined not to need cpap, thinks she is doing well         Endocrine   Thyroid nodule    Did not get [place] as planned  Will order thyroid US to re check several nodules       Relevant Orders   US THYROID     Other   Colon cancer screening    Colonoscopy 12/2021- with 5 y recall      Constipation    Recommended trial of fluids and fiber and miralax Colonoscopy utd from 12/2021       Head pain    No mass felt in this area  Muscles are tight  This got worse after her covid shot but improving again       Relevant Medications   tiZANidine (ZANAFLEX) 4 MG  capsule   Low back pain    Takes tizanidine prn  Refilled today  Caution of sedation       Relevant Medications   tiZANidine (ZANAFLEX) 4 MG capsule   Routine general medical examination at a health care facility - Primary    Reviewed health habits including diet and exercise and skin cancer prevention Reviewed appropriate screening tests for age  Also reviewed health mt list, fam hx and immunization status , as well as social and family history  See HPI Labs reviewed  Flu shot given Will return for shingix vaccine after 09/28/22 Mammogram utd 07/2022 Colonoscopy utd 12/2021 with 5 y recall Continues vit D, no falls or fx        Relevant Orders   Flu Vaccine QUAD 6+ mos PF IM (Fluarix Quad PF) (Completed)   Skin hypopigmentation    Small areas Legs  bilateral      Relevant Orders   Ambulatory referral to Dermatology   Vitamin D deficiency    Level in mid 59s Vitamin D level is therapeutic with current supplementation Disc importance of this to bone and overall health       Other Visit Diagnoses     Need for influenza vaccination       Relevant Orders   Flu Vaccine QUAD 6+ mos PF IM (Fluarix Quad PF) (Completed)

## 2022-09-24 NOTE — Assessment & Plan Note (Signed)
No mass felt in this area  Muscles are tight  This got worse after her covid shot but improving again

## 2022-09-24 NOTE — Patient Instructions (Addendum)
Aim for 64 oz fluid daily combined / mostly water  Increase fruit/veggies and high fiber foods  Miralax mixed with fluid daily helps (may take several days to work) A magnesium supplement 250 mg daily may help also   Call and schedule a nurse visit for the shingrix vaccine after 10/23    I will refer you for the thyroid ultrasound  If you don't hear in 2 weeks please call us   For cholesterol:  Avoid red meat/ fried foods/ egg yolks/ fatty breakfast meats/ butter, cheese and high fat dairy/ and shellfish   I will work on a dermatology referral  Let us know if you   Flu shot today

## 2022-09-24 NOTE — Assessment & Plan Note (Signed)
Did not get [place] as planned  Will order thyroid US to re check several nodules

## 2022-09-24 NOTE — Assessment & Plan Note (Signed)
bp in fair control at this time  BP Readings from Last 1 Encounters:  09/24/22 126/80   No changes needed Most recent labs reviewed  Disc lifstyle change with low sodium diet and exercise  Plan to continue metoprolol 12.5 mg bid amloidpine 5 mg daily

## 2022-09-24 NOTE — Assessment & Plan Note (Signed)
Small areas Legs  bilateral

## 2022-09-24 NOTE — Assessment & Plan Note (Signed)
Colonoscopy 12/2021- with 5 y recall

## 2022-09-29 ENCOUNTER — Ambulatory Visit: Payer: BC Managed Care – PPO

## 2022-09-29 ENCOUNTER — Other Ambulatory Visit: Payer: Self-pay | Admitting: Family Medicine

## 2022-09-29 NOTE — Telephone Encounter (Signed)
See pharmacy's comments:  PATIENT IS REQUESTING THE TABLETS RATHER THAN THE CAPSULES. TABLETS ARE LESS EXPENSIVE. WE CANNOT INTERCHANGE BETWEEN THESE WITHOUT PROVIDER APPROVAL DUE TO DIFFERING KINETICS.

## 2022-10-07 ENCOUNTER — Ambulatory Visit: Payer: BC Managed Care – PPO | Attending: Internal Medicine | Admitting: Internal Medicine

## 2022-10-07 ENCOUNTER — Encounter: Payer: Self-pay | Admitting: Internal Medicine

## 2022-10-07 VITALS — BP 128/80 | HR 93 | Ht 63.0 in | Wt 171.0 lb

## 2022-10-07 DIAGNOSIS — R0789 Other chest pain: Secondary | ICD-10-CM

## 2022-10-07 DIAGNOSIS — I700 Atherosclerosis of aorta: Secondary | ICD-10-CM | POA: Diagnosis not present

## 2022-10-07 DIAGNOSIS — I10 Essential (primary) hypertension: Secondary | ICD-10-CM | POA: Diagnosis not present

## 2022-10-07 DIAGNOSIS — E7800 Pure hypercholesterolemia, unspecified: Secondary | ICD-10-CM

## 2022-10-07 DIAGNOSIS — I471 Supraventricular tachycardia, unspecified: Secondary | ICD-10-CM | POA: Diagnosis not present

## 2022-10-07 NOTE — Patient Instructions (Signed)
Medication Instructions:  Your physician recommends that you continue on your current medications as directed. Please refer to the Current Medication list given to you today.  *If you need a refill on your cardiac medications before your next appointment, please call your pharmacy*  Lab Work: If you have labs (blood work) drawn today and your tests are completely normal, you will receive your results only by: [place] (if you have MyChart) OR A paper copy in the mail If you have any lab test that is abnormal or we need to change your treatment, we will call you to review the results.  Testing/Procedures: No ordered today.  Follow-Up: At [facility], you and your health needs are our priority.  As part of our continuing mission to provide you with exceptional heart care, we have created designated Provider Care Teams.  These Care Teams include your primary Cardiologist (physician) and Advanced Practice Providers (APPs -  Physician Assistants and Nurse Practitioners) who all work together to provide you with the care you need, when you need it.  We recommend signing up for the patient portal called "MyChart".  Sign up information is provided on this After Visit Summary.  MyChart is used to connect with patients for Virtual Visits (Telemedicine).  Patients are able to view lab/test results, encounter notes, upcoming appointments, etc.  Non-urgent messages can be sent to your provider as well.   To learn more about what you can do with MyChart, go to [email or web address].    Your next appointment:   1 year(s)  The format for your next appointment:   In Person  Provider:   Nelva Bush, MD     Important Information About Sugar

## 2022-10-07 NOTE — Progress Notes (Signed)
Follow-up Outpatient Visit Date: 10/07/2022  Primary Care Provider: Abner Greenspan, MD [place] [address]  Chief Complaint: Follow-up chest pain and palpitations  HPI:  Mindy Long is a 61 y.o. female with history of palpitations, chest pain, hypertension, sleep apnea, GERD, and kidney stones, who presents for follow-up of chest pain and palpitations.  I last saw her a year ago, at which time she complained of sporadic atypical chest pain.  She was again noted to have nonspecific T wave changes on her EKG.  We obtained a coronary CTA, which showed no significant CAD, though some aortic atherosclerosis was present.  Incidental note was made of several subcentimeter lung nodules.  Follow-up CT chest a month later showed resolution or significantly decrease in size of the nodules suggesting resolving nonspecific infectious or inflammatory process.  Today, Mindy Long reports that she has been feeling fairly well.  She has noted 2-3 episodes of needlelike pain in the chest that lasted 2 to 3 minutes since our last visit.  They always occur at rest.  She remains active, swimming as well as participating in yoga and Zumba.  She does not have any symptoms with activity.  She also denies palpitations, lightheadedness, edema, and shortness of breath.  --------------------------------------------------------------------------------------------------  Cardiovascular History & Procedures: Cardiovascular Problems: Palpitations Chest pain Aortic atherosclerosis   Risk Factors: Hypertension   Cath/PCI: None   CV Surgery: None   EP Procedures and Devices: 14-day event monitor (04/04/2021): Predominantly sinus rhythm with rare PAC's and PVC's.  Two brief episodes of PSVT also noted (lasting up to 9.6 seconds).   Non-Invasive Evaluation(s): Coronary CTA (10/23/2021): No significant coronary artery disease.  Coronary calcium score 0.  Incidental note made of multiple  subcentimeter pulmonary nodules (resolved or significantly decreased in size on follow-up CT consistent with nonspecific infectious/inflammatory etiology).  Aortic atherosclerosis noted on follow-up CT. TTE (05/15/2021): Normal LV size and wall thickness.  LVEF 55-60% with normal diastolic function.  Normal RV size and function.  Mild mitral regurgitation.  Normal CVP. Carotid Doppler (08/21/2016): No significant carotid atherosclerosis.  Antegrade vertebral artery flow bilaterally.  Recent CV Pertinent Labs: Lab Results  Component Value Date   CHOL 218 (H) 09/17/2022   HDL 84.00 09/17/2022   LDLCALC 122 (H) 09/17/2022   LDLDIRECT 126.7 10/23/2011   TRIG 59.0 09/17/2022   CHOLHDL 3 09/17/2022   INR 0.94 11/15/2018   K 3.7 09/17/2022   MG 2.2 03/17/2021   BUN 12 09/17/2022   CREATININE 0.87 09/17/2022    Past medical and surgical history were reviewed and updated in EPIC.  Current Meds  Medication Sig   amLODipine (NORVASC) 5 MG tablet TAKE 1 TABLET (5 MG TOTAL) BY MOUTH DAILY.   Cholecalciferol (VITAMIN D3) 2000 UNITS capsule Take 2,000 Units by mouth daily.   gabapentin (NEURONTIN) 300 MG capsule Take 300 mg by mouth 3 (three) times daily as needed (pain).   ketoconazole (NIZORAL) 2 % cream Apply 1 application topically daily. To affected area/ foot   metoprolol tartrate (LOPRESSOR) 25 MG tablet Take 0.5 tablets (12.5 mg total) by mouth 2 (two) times daily.   omeprazole (PRILOSEC) 20 MG capsule TAKE 1 CAPSULE (20 MG TOTAL) BY MOUTH DAILY AS NEEDED.   tiZANidine (ZANAFLEX) 4 MG tablet Take 1 tablet (4 mg total) by mouth every 8 (eight) hours as needed for muscle spasms.    Allergies: Atenolol and Hydrocodone  Social History   Tobacco Use   Smoking status: Never  Smokeless tobacco: Never  Vaping Use   Vaping Use: Never used  Substance Use Topics   Alcohol use: Yes    Comment: occas.   Drug use: No    Family History  Problem Relation Age of Onset   Stroke Father     Hypertension Father    Diabetes Father    Cancer Brother        stomach tumor    Review of Systems: A 12-system review of systems was performed and was negative except as noted in the HPI.  --------------------------------------------------------------------------------------------------  Physical Exam: BP (!) 120/90 (BP Location: Left Arm, Patient Position: Sitting, Cuff Size: Large)   Pulse 93   Ht '5\' 3"'$  (1.6 m)   Wt 171 lb (77.6 kg)   SpO2 98%   BMI 30.29 kg/m  Repeat BP: 128/80  General:  NAD. Neck: No JVD or HJR. Lungs: Clear to auscultation bilaterally without wheezes or crackles. Heart: Regular rate and rhythm without murmurs, rubs, or gallops. Abdomen: Soft, nontender, nondistended. Extremities: No lower extremity edema.  EKG: Normal sinus rhythm with nonspecific T wave abnormality.  No significant change from prior tracing on 10/01/2021.  Lab Results  Component Value Date   WBC 5.2 09/17/2022   HGB 13.9 09/17/2022   HCT 42.5 09/17/2022   MCV 78.0 09/17/2022   PLT 240.0 09/17/2022    Lab Results  Component Value Date   NA 138 09/17/2022   K 3.7 09/17/2022   CL 104 09/17/2022   CO2 25 09/17/2022   BUN 12 09/17/2022   CREATININE 0.87 09/17/2022   GLUCOSE 91 09/17/2022   ALT 10 09/17/2022    Lab Results  Component Value Date   CHOL 218 (H) 09/17/2022   HDL 84.00 09/17/2022   LDLCALC 122 (H) 09/17/2022   LDLDIRECT 126.7 10/23/2011   TRIG 59.0 09/17/2022   CHOLHDL 3 09/17/2022    --------------------------------------------------------------------------------------------------  ASSESSMENT AND PLAN: Atypical chest pain: A few brief episodes of chest pain have occurred since our last visit.  I reassured Mindy Long that these episodes are unlikely to be heart related given atypical nature of the symptoms and coronary CTA last year without any CAD.  No further work-up or intervention is recommended at this time.  Hypertension: Blood pressure  borderline elevated on initial check, slightly better on recheck.  Continue current regimen of amlodipine and metoprolol tartrate.  I encouraged Mindy Long to minimize her sodium intake and to monitor her blood pressure periodically at home.  PSVT: No palpitations reported.  Continue low-dose metoprolol.  Aortic atherosclerosis and hyperlipidemia: Mild aortic atherosclerosis was incidentally noted on CT chest last year.  Most recent lipid panel in October was notable for mildly elevated LDL of 122.  We discussed role for statin therapy and primary prevention.  Mindy Long wishes to defer pharmacotherapy and work on lifestyle modifications.  Ongoing monitoring per Dr. Glori Bickers.  Follow-up: Return to clinic in 1 year.  Nelva Bush, MD 10/07/2022 11:23 AM

## 2022-10-14 ENCOUNTER — Ambulatory Visit (INDEPENDENT_AMBULATORY_CARE_PROVIDER_SITE_OTHER): Payer: BC Managed Care – PPO

## 2022-10-14 ENCOUNTER — Other Ambulatory Visit: Payer: Self-pay

## 2022-10-14 DIAGNOSIS — Z23 Encounter for immunization: Secondary | ICD-10-CM | POA: Diagnosis not present

## 2022-10-14 MED ORDER — METOPROLOL TARTRATE 25 MG PO TABS: 12.5000 mg | ORAL_TABLET | Freq: Two times a day (BID) | ORAL | 3 refills | Status: DC

## 2022-10-14 NOTE — Progress Notes (Signed)
Patient presented for 2nd Shingles vaccine given by Sherrilee Gilles, CMA to right deltoid, patient voiced no concerns nor showed any signs of distress during injection.

## 2022-12-05 ENCOUNTER — Other Ambulatory Visit: Payer: Self-pay | Admitting: Family Medicine

## 2023-01-26 DIAGNOSIS — L578 Other skin changes due to chronic exposure to nonionizing radiation: Secondary | ICD-10-CM | POA: Diagnosis not present

## 2023-01-26 DIAGNOSIS — D1801 Hemangioma of skin and subcutaneous tissue: Secondary | ICD-10-CM | POA: Diagnosis not present

## 2023-01-26 DIAGNOSIS — R21 Rash and other nonspecific skin eruption: Secondary | ICD-10-CM | POA: Diagnosis not present

## 2023-01-26 DIAGNOSIS — L80 Vitiligo: Secondary | ICD-10-CM | POA: Diagnosis not present

## 2023-02-25 ENCOUNTER — Ambulatory Visit
Admission: RE | Admit: 2023-02-25 | Discharge: 2023-02-25 | Disposition: A | Discharge status: Home / Self Care | Payer: BC Managed Care – PPO | Source: Ambulatory Visit | Attending: Family Medicine | Admitting: Family Medicine

## 2023-02-25 DIAGNOSIS — E042 Nontoxic multinodular goiter: Secondary | ICD-10-CM | POA: Diagnosis not present

## 2023-02-25 DIAGNOSIS — E041 Nontoxic single thyroid nodule: Secondary | ICD-10-CM

## 2023-03-16 DIAGNOSIS — L80 Vitiligo: Secondary | ICD-10-CM | POA: Diagnosis not present

## 2023-03-16 DIAGNOSIS — L708 Other acne: Secondary | ICD-10-CM | POA: Diagnosis not present

## 2023-04-27 DIAGNOSIS — L80 Vitiligo: Secondary | ICD-10-CM | POA: Diagnosis not present

## 2023-06-05 ENCOUNTER — Other Ambulatory Visit: Payer: Self-pay | Admitting: Family Medicine

## 2023-06-07 NOTE — Telephone Encounter (Signed)
Last filled on 09/29/22 #30 tabs/ 0 refills, CPE Scheduled 09/28/23, please advise

## 2023-06-30 ENCOUNTER — Ambulatory Visit (INDEPENDENT_AMBULATORY_CARE_PROVIDER_SITE_OTHER): Payer: BC Managed Care – PPO | Admitting: Primary Care

## 2023-06-30 ENCOUNTER — Encounter: Payer: Self-pay | Admitting: Primary Care

## 2023-06-30 VITALS — BP 118/72 | HR 60 | Temp 97.9°F | Ht 63.0 in | Wt 171.0 lb

## 2023-06-30 DIAGNOSIS — T2125XA Burn of second degree of buttock, initial encounter: Secondary | ICD-10-CM | POA: Insufficient documentation

## 2023-06-30 MED ORDER — SILVER SULFADIAZINE 1 % EX CREA: 1.0000 | TOPICAL_CREAM | Freq: Every day | CUTANEOUS | 0 refills | Status: DC

## 2023-06-30 NOTE — Progress Notes (Signed)
Subjective:    Patient ID: Mindy Long, female    DOB: Oct 27, 1961, 62 y.o.   MRN: [id number]  Burn    Farida Mcreynolds Roundtree is a very pleasant 62 y.o. female patient of Dr. Milinda Antis with a history of hypertension, OSA, low back pain, shingles, skin hypopigmentation who presents today to discuss a burn.  Five days ago she was getting her hair done at the salon. The boiling water needed to curl her hair accidentally spilled down her back mostly affecting her left buttocks.   Since then her left buttocks pain has worsened. She's also noticed pain with sitting or with clothing rubbing against the burn.  She's applied an OTC pain reliever with improvement. She denies fevers, drainage.    Review of Systems  Constitutional:  Negative for fever.  Skin:  Positive for wound.         Past Medical History:  Diagnosis Date   Back pain    Chronic back pain    GERD (gastroesophageal reflux disease)    Headache(784.0)    History of COVID-19    History of kidney stones    History of nephrolithiasis    Hypertension    Plantar fasciitis    PSVT (paroxysmal supraventricular tachycardia)    Pulmonary nodules    Sleep apnea    Ulcer 1980's   peptic ulcer disease    Social History   Socioeconomic History   Marital status: Married    Spouse name: Not on file   Number of children: Not on file   Years of education: Not on file   Highest education level: Not on file  Occupational History   Occupation: ar specialist  Tobacco Use   Smoking status: Never   Smokeless tobacco: Never  Vaping Use   Vaping status: Never Used  Substance and Sexual Activity   Alcohol use: Yes    Comment: occas.   Drug use: No   Sexual activity: Not on file  Other Topics Concern   Not on file  Social History Narrative   Walks for exercise   Social Determinants of Health   Financial Resource Strain: Not on file  Food Insecurity: Not on file  Transportation Needs: Not on file  Physical Activity: Not  on file  Stress: Not on file  Social Connections: Not on file  Intimate Partner Violence: Not on file    Past Surgical History:  Procedure Laterality Date   ABDOMINAL HYSTERECTOMY  2002   CHOLECYSTECTOMY  1984   COLONOSCOPY  12/18/2011   Normal   COLONOSCOPY WITH PROPOFOL N/A 12/23/2021   Procedure: COLONOSCOPY WITH PROPOFOL;  Surgeon: Midge Minium, MD;  Location: [facility];  Service: Endoscopy;  Laterality: N/A;   IR ANGIO EXTERNAL CAROTID SEL EXT CAROTID BILAT MOD SED  11/15/2018   IR ANGIO INTRA EXTRACRAN SEL INTERNAL CAROTID BILAT MOD SED  11/15/2018   IR ANGIO VERTEBRAL SEL VERTEBRAL UNI R MOD SED  11/15/2018   IR US GUIDE VASC ACCESS RIGHT  11/15/2018   LIPOMA EXCISION Left 05/20/2021   Procedure: EXCISION LIPOMA OF LEFT BUTTOCK;  Surgeon: Luretha Murphy, MD;  Location: [facility];  Service: General;  Laterality: Left;   OOPHORECTOMY      Family History  Problem Relation Age of Onset   Stroke Father    Hypertension Father    Diabetes Father    Cancer Brother        stomach tumor    Allergies  Allergen Reactions   Atenolol Cough  Hydrocodone Nausea Only    Current Outpatient Medications on File Prior to Visit  Medication Sig Dispense Refill   amLODipine (NORVASC) 5 MG tablet TAKE 1 TABLET (5 MG TOTAL) BY MOUTH DAILY. 90 tablet 1   Cholecalciferol (VITAMIN D3) 2000 UNITS capsule Take 2,000 Units by mouth daily.     gabapentin (NEURONTIN) 300 MG capsule Take 300 mg by mouth 3 (three) times daily as needed (pain).     ketoconazole (NIZORAL) 2 % cream Apply 1 application topically daily. To affected area/ foot 15 g 1   metoprolol tartrate (LOPRESSOR) 25 MG tablet Take 0.5 tablets (12.5 mg total) by mouth 2 (two) times daily. 90 tablet 3   omeprazole (PRILOSEC) 20 MG capsule TAKE 1 CAPSULE BY MOUTH DAILY AS NEEDED. 90 capsule 1   tiZANidine (ZANAFLEX) 4 MG tablet TAKE 1 TABLET (4 MG TOTAL) BY MOUTH EVERY 8 (EIGHT) HOURS AS NEEDED FOR MUSCLE SPASMS 30 tablet 0   No  current facility-administered medications on file prior to visit.    BP 118/72   Pulse 60   Temp 97.9 F (36.6 C) (Temporal)   Ht 5\' 3"  (1.6 m)   Wt 171 lb (77.6 kg)   SpO2 98%   BMI 30.29 kg/m  Objective:   Physical Exam Constitutional:      General: She is not in acute distress. Pulmonary:     Effort: Pulmonary effort is normal.  Skin:    General: Skin is warm and dry.     Comments: 1.5 cm X 0.5 cm oval shaped second degree burn noted to left medial buttocks without fat layer exposure. No surrounding erythema or warmth. No drainage.            Assessment & Plan:  Second degree burn of buttock, initial encounter Assessment & Plan: Without cellulitis or complication at this point.  Treat with silvadene 1% cream daily. Discussed home care instructions.  Discussed return precautions.     Orders: -     Silver sulfADIAZINE; Apply 1 Application topically daily.  Dispense: 25 g; Refill: 0        Doreene Nest, NP

## 2023-06-30 NOTE — Patient Instructions (Signed)
Apply the silvadene cream daily until the wound has healed.  Protect your wound when wearing clothing or sitting.  It was a pleasure meeting you!

## 2023-06-30 NOTE — Assessment & Plan Note (Signed)
Without cellulitis or complication at this point.  Treat with silvadene 1% cream daily. Discussed home care instructions.  Discussed return precautions.

## 2023-07-05 ENCOUNTER — Other Ambulatory Visit: Payer: Self-pay | Admitting: Family Medicine

## 2023-07-05 DIAGNOSIS — Z1231 Encounter for screening mammogram for malignant neoplasm of breast: Secondary | ICD-10-CM

## 2023-07-06 DIAGNOSIS — L80 Vitiligo: Secondary | ICD-10-CM | POA: Diagnosis not present

## 2023-07-23 ENCOUNTER — Ambulatory Visit
Admission: RE | Admit: 2023-07-23 | Discharge: 2023-07-23 | Disposition: A | Discharge status: Home / Self Care | Payer: BC Managed Care – PPO | Source: Ambulatory Visit | Attending: Family Medicine | Admitting: Family Medicine

## 2023-07-23 DIAGNOSIS — Z1231 Encounter for screening mammogram for malignant neoplasm of breast: Secondary | ICD-10-CM

## 2023-09-20 ENCOUNTER — Telehealth: Payer: Self-pay | Admitting: Family Medicine

## 2023-09-20 DIAGNOSIS — Z79899 Other long term (current) drug therapy: Secondary | ICD-10-CM | POA: Insufficient documentation

## 2023-09-20 DIAGNOSIS — Z131 Encounter for screening for diabetes mellitus: Secondary | ICD-10-CM | POA: Insufficient documentation

## 2023-09-20 DIAGNOSIS — E559 Vitamin D deficiency, unspecified: Secondary | ICD-10-CM

## 2023-09-20 DIAGNOSIS — I10 Essential (primary) hypertension: Secondary | ICD-10-CM

## 2023-09-20 DIAGNOSIS — E7800 Pure hypercholesterolemia, unspecified: Secondary | ICD-10-CM

## 2023-09-20 NOTE — Telephone Encounter (Signed)
-----   Message from Lovena Neighbours sent at 09/08/2023  1:46 PM EDT ----- Regarding: Labs for Tuesday 10.15.24 Please put physical lab orders in future. Thank you, Denny Peon

## 2023-09-21 ENCOUNTER — Other Ambulatory Visit (INDEPENDENT_AMBULATORY_CARE_PROVIDER_SITE_OTHER): Payer: BC Managed Care – PPO

## 2023-09-21 DIAGNOSIS — I10 Essential (primary) hypertension: Secondary | ICD-10-CM | POA: Diagnosis not present

## 2023-09-21 DIAGNOSIS — Z79899 Other long term (current) drug therapy: Secondary | ICD-10-CM

## 2023-09-21 DIAGNOSIS — E7800 Pure hypercholesterolemia, unspecified: Secondary | ICD-10-CM | POA: Diagnosis not present

## 2023-09-21 DIAGNOSIS — E559 Vitamin D deficiency, unspecified: Secondary | ICD-10-CM

## 2023-09-21 DIAGNOSIS — Z131 Encounter for screening for diabetes mellitus: Secondary | ICD-10-CM | POA: Diagnosis not present

## 2023-09-21 LAB — COMPREHENSIVE METABOLIC PANEL
ALT: 11 U/L (ref 0–35)
AST: 15 U/L (ref 0–37)
Albumin: 3.9 g/dL (ref 3.5–5.2)
Alkaline Phosphatase: 91 U/L (ref 39–117)
BUN: 12 mg/dL (ref 6–23)
CO2: 27 meq/L (ref 19–32)
Calcium: 9.2 mg/dL (ref 8.4–10.5)
Chloride: 105 meq/L (ref 96–112)
Creatinine, Ser: 0.88 mg/dL (ref 0.40–1.20)
GFR: 69.49 mL/min (ref >60.00)
Glucose, Bld: 93 mg/dL (ref 70–99)
Potassium: 3.6 meq/L (ref 3.5–5.1)
Sodium: 141 meq/L (ref 135–145)
Total Bilirubin: 0.7 mg/dL (ref 0.2–1.2)
Total Protein: 7.1 g/dL (ref 6.0–8.3)

## 2023-09-21 LAB — CBC WITH DIFFERENTIAL/PLATELET
Basophils Absolute: 0 10*3/uL (ref 0.0–0.1)
Basophils Relative: 0.4 % (ref 0.0–3.0)
Eosinophils Absolute: 0.2 10*3/uL (ref 0.0–0.7)
Eosinophils Relative: 3.3 % (ref 0.0–5.0)
HCT: 41.9 % (ref 36.0–46.0)
Hemoglobin: 13.1 g/dL (ref 12.0–15.0)
Lymphocytes Relative: 49.4 % — ABNORMAL HIGH (ref 12.0–46.0)
Lymphs Abs: 2.4 10*3/uL (ref 0.7–4.0)
MCHC: 31.3 g/dL (ref 30.0–36.0)
MCV: 78.2 fL (ref 78.0–100.0)
Monocytes Absolute: 0.4 10*3/uL (ref 0.1–1.0)
Monocytes Relative: 8.6 % (ref 3.0–12.0)
Neutro Abs: 1.9 10*3/uL (ref 1.4–7.7)
Neutrophils Relative %: 38.3 % — ABNORMAL LOW (ref 43.0–77.0)
Platelets: 261 10*3/uL (ref 150.0–400.0)
RBC: 5.36 Mil/uL — ABNORMAL HIGH (ref 3.87–5.11)
RDW: 14.4 % (ref 11.5–15.5)
WBC: 4.8 10*3/uL (ref 4.0–10.5)

## 2023-09-21 LAB — LIPID PANEL
Cholesterol: 212 mg/dL — ABNORMAL HIGH (ref 0–200)
HDL: 78.2 mg/dL (ref >39.00)
LDL Cholesterol: 120 mg/dL — ABNORMAL HIGH (ref 0–99)
NonHDL: 133.71
Total CHOL/HDL Ratio: 3
Triglycerides: 69 mg/dL (ref 0.0–149.0)
VLDL: 13.8 mg/dL (ref 0.0–40.0)

## 2023-09-21 LAB — HEMOGLOBIN A1C: Hgb A1c MFr Bld: 5.9 % (ref 4.6–6.5)

## 2023-09-21 LAB — TSH: TSH: 1.92 u[IU]/mL (ref 0.35–5.50)

## 2023-09-21 LAB — VITAMIN B12: Vitamin B-12: 424 pg/mL (ref 211–911)

## 2023-09-21 LAB — VITAMIN D 25 HYDROXY (VIT D DEFICIENCY, FRACTURES): VITD: 26.96 ng/mL — ABNORMAL LOW (ref 30.00–100.00)

## 2023-09-28 ENCOUNTER — Ambulatory Visit: Payer: BC Managed Care – PPO | Admitting: Family Medicine

## 2023-09-28 ENCOUNTER — Encounter: Payer: Self-pay | Admitting: Family Medicine

## 2023-09-28 VITALS — BP 128/78 | HR 96 | Temp 98.2°F | Ht 61.75 in | Wt 170.1 lb

## 2023-09-28 DIAGNOSIS — K219 Gastro-esophageal reflux disease without esophagitis: Secondary | ICD-10-CM

## 2023-09-28 DIAGNOSIS — Z0000 Encounter for general adult medical examination without abnormal findings: Secondary | ICD-10-CM

## 2023-09-28 DIAGNOSIS — I700 Atherosclerosis of aorta: Secondary | ICD-10-CM

## 2023-09-28 DIAGNOSIS — Z23 Encounter for immunization: Secondary | ICD-10-CM | POA: Diagnosis not present

## 2023-09-28 DIAGNOSIS — I471 Supraventricular tachycardia, unspecified: Secondary | ICD-10-CM

## 2023-09-28 DIAGNOSIS — E041 Nontoxic single thyroid nodule: Secondary | ICD-10-CM | POA: Diagnosis not present

## 2023-09-28 DIAGNOSIS — Z131 Encounter for screening for diabetes mellitus: Secondary | ICD-10-CM

## 2023-09-28 DIAGNOSIS — E559 Vitamin D deficiency, unspecified: Secondary | ICD-10-CM

## 2023-09-28 DIAGNOSIS — I10 Essential (primary) hypertension: Secondary | ICD-10-CM | POA: Diagnosis not present

## 2023-09-28 DIAGNOSIS — H9311 Tinnitus, right ear: Secondary | ICD-10-CM

## 2023-09-28 DIAGNOSIS — R7303 Prediabetes: Secondary | ICD-10-CM

## 2023-09-28 DIAGNOSIS — Z1211 Encounter for screening for malignant neoplasm of colon: Secondary | ICD-10-CM

## 2023-09-28 DIAGNOSIS — E7800 Pure hypercholesterolemia, unspecified: Secondary | ICD-10-CM

## 2023-09-28 DIAGNOSIS — Z79899 Other long term (current) drug therapy: Secondary | ICD-10-CM

## 2023-09-28 MED ORDER — AMLODIPINE BESYLATE 5 MG PO TABS: 5.0000 mg | ORAL_TABLET | Freq: Every day | ORAL | 3 refills | Status: DC

## 2023-09-28 MED ORDER — METOPROLOL TARTRATE 25 MG PO TABS: 12.5000 mg | ORAL_TABLET | Freq: Two times a day (BID) | ORAL | 3 refills | Status: DC

## 2023-09-28 NOTE — Assessment & Plan Note (Signed)
bp in fair control at this time  BP Readings from Last 1 Encounters:  09/28/23 128/78   No changes needed Most recent labs reviewed  Disc lifstyle change with low sodium diet and exercise  Plan to continue metoprolol 12.5 mg bid amloidpine 5 mg daily

## 2023-09-28 NOTE — Assessment & Plan Note (Signed)
Worsening -right ear/pulsitile Had carotids checked in 2017 No carotid bruit on exam Hearing grossly normal  Req ref to ENT in [place] if possible

## 2023-09-28 NOTE — Assessment & Plan Note (Signed)
Takes omeprazole 20mg  daily.

## 2023-09-28 NOTE — Patient Instructions (Addendum)
Go up on your vitamin D3 to 4000 international units daily    Keep exercising  Add some strength training to your routine, this is important for bone and brain health and can reduce your risk of falls and help your body use insulin properly and regulate weight  Light weights, exercise bands , and internet videos are a good way to start  Yoga (chair or regular), machines , floor exercises or a gym with machines are also good options   In the future work up to 5 days per week  At least 30 minutes   Try to avoid foods that trigger acid reflux as much as you can   Try to get most of your carbohydrates from produce (with the exception of white potatoes) and whole grains Eat less bread/pasta/rice/snack foods/cereals/sweets and other items from the middle of the grocery store (processed carbs)   We may want to consider ENT visit for the ear/ tinnitus          Mediterranean Diet  Why follow it? Research shows. Those who follow the Mediterranean diet have a reduced risk of heart disease  The diet is associated with a reduced incidence of Parkinson's and Alzheimer's diseases People following the diet may have longer life expectancies and lower rates of chronic diseases  The Dietary Guidelines for Americans recommends the Mediterranean diet as an eating plan to promote health and prevent disease  What Is the Mediterranean Diet?  Healthy eating plan based on typical foods and recipes of Mediterranean-style cooking The diet is primarily a plant based diet; these foods should make up a majority of meals   Starches - Plant based foods should make up a majority of meals - They are an important sources of vitamins, minerals, energy, antioxidants, and fiber - Choose whole grains, foods high in fiber and minimally processed items  - Typical grain sources include wheat, oats, barley, corn, brown rice, bulgar, farro, millet, polenta, couscous  - Various types of beans include chickpeas, lentils,  fava beans, black beans, white beans   Fruits  Veggies - Large quantities of antioxidant rich fruits & veggies; 6 or more servings  - Vegetables can be eaten raw or lightly drizzled with oil and cooked  - Vegetables common to the traditional Mediterranean Diet include: artichokes, arugula, beets, broccoli, brussel sprouts, cabbage, carrots, celery, collard greens, cucumbers, eggplant, kale, leeks, lemons, lettuce, mushrooms, okra, onions, peas, peppers, potatoes, pumpkin, radishes, rutabaga, shallots, spinach, sweet potatoes, turnips, zucchini - Fruits common to the Mediterranean Diet include: apples, apricots, avocados, cherries, clementines, dates, figs, grapefruits, grapes, melons, nectarines, oranges, peaches, pears, pomegranates, strawberries, tangerines  Fats - Replace butter and margarine with healthy oils, such as olive oil, canola oil, and tahini  - Limit nuts to no more than a handful a day  - Nuts include walnuts, almonds, pecans, pistachios, pine nuts  - Limit or avoid candied, honey roasted or heavily salted nuts - Olives are central to the [employer] - can be eaten whole or used in a variety of dishes   Meats Protein - Limiting red meat: no more than a few times a month - When eating red meat: choose lean cuts and keep the portion to the size of deck of cards - Eggs: approx. 0 to 4 times a week  - Fish and lean poultry: at least 2 a week  - Healthy protein sources include, chicken, [place], lean beef, lamb - Increase intake of seafood such as tuna, salmon, trout, mackerel, shrimp, scallops -  Avoid or limit high fat processed meats such as sausage and bacon  Dairy - Include moderate amounts of low fat dairy products  - Focus on healthy dairy such as fat free yogurt, skim milk, low or reduced fat cheese - Limit dairy products higher in fat such as whole or 2% milk, cheese, ice cream  Alcohol - Moderate amounts of red wine is ok  - No more than 5 oz daily for women (all ages)  and men older than age 45  - No more than 10 oz of wine daily for men younger than 65  Other - Limit sweets and other desserts  - Use herbs and spices instead of salt to flavor foods  - Herbs and spices common to the traditional Mediterranean Diet include: basil, bay leaves, chives, cloves, cumin, fennel, garlic, lavender, marjoram, mint, oregano, parsley, pepper, rosemary, sage, savory, sumac, tarragon, thyme   It's not just a diet, it's a lifestyle:  The Mediterranean diet includes lifestyle factors typical of those in the region  Foods, drinks and meals are best eaten with others and savored Daily physical activity is important for overall good health This could be strenuous exercise like running and aerobics This could also be more leisurely activities such as walking, housework, yard-work, or taking the stairs Moderation is the key; a balanced and healthy diet accommodates most foods and drinks Consider portion sizes and frequency of consumption of certain foods   Meal Ideas & Options:  Breakfast:  Whole wheat toast or whole wheat English muffins with peanut butter & hard boiled egg Steel cut oats topped with apples & cinnamon and skim milk  Fresh fruit: banana, strawberries, melon, berries, peaches  Smoothies: strawberries, bananas, greek yogurt, peanut butter Low fat greek yogurt with blueberries and granola  Egg white omelet with spinach and mushrooms Breakfast couscous: whole wheat couscous, apricots, skim milk, cranberries  Sandwiches:  Hummus and grilled vegetables (peppers, zucchini, squash) on whole wheat bread   Grilled chicken on whole wheat pita with lettuce, tomatoes, cucumbers or tzatziki  [place] salad on whole wheat bread: tuna salad made with greek yogurt, olives, red peppers, capers, green onions Garlic rosemary lamb pita: lamb sauted with garlic, rosemary, salt & pepper; add lettuce, cucumber, greek yogurt to pita - flavor with lemon juice and black pepper  Seafood:   Mediterranean grilled salmon, seasoned with garlic, basil, parsley, lemon juice and black pepper Shrimp, lemon, and spinach whole-grain pasta salad made with low fat greek yogurt  Seared scallops with lemon orzo  Seared tuna steaks seasoned salt, pepper, coriander topped with tomato mixture of olives, tomatoes, olive oil, minced garlic, parsley, green onions and cappers  Meats:  Herbed greek chicken salad with kalamata olives, cucumber, feta  Red bell peppers stuffed with spinach, bulgur, lean ground beef (or lentils) & topped with feta   Kebabs: skewers of chicken, tomatoes, onions, zucchini, squash  [place] burgers: made with red onions, mint, dill, lemon juice, feta cheese topped with roasted red peppers Vegetarian Cucumber salad: cucumbers, artichoke hearts, celery, red onion, feta cheese, tossed in olive oil & lemon juice  Hummus and whole grain pita points with a greek salad (lettuce, tomato, feta, olives, cucumbers, red onion) Lentil soup with celery, carrots made with vegetable broth, garlic, salt and pepper  Tabouli salad: parsley, bulgur, mint, scallions, cucumbers, tomato, radishes, lemon juice, olive oil, salt and pepper.

## 2023-09-28 NOTE — Assessment & Plan Note (Signed)
Last [place] indicated no need for follow up  No clinical or exam changes

## 2023-09-28 NOTE — Assessment & Plan Note (Signed)
Colonoscopy 12/2021- with 5 y recall

## 2023-09-28 NOTE — Assessment & Plan Note (Signed)
No symptoms Continue to work on good blood pressure and chol control

## 2023-09-28 NOTE — Assessment & Plan Note (Signed)
Disc goals for lipids and reasons to control them Rev last labs with pt Rev low sat fat diet in detail Stable LDL of 120

## 2023-09-28 NOTE — Assessment & Plan Note (Signed)
Last vitamin D Lab Results  Component Value Date   VD25OH 26.96 (L) 09/21/2023   Takes ppi Instructed to increase to 4000 international units D3 daily for bone and overall health

## 2023-09-28 NOTE — Assessment & Plan Note (Signed)
No palpitations reported Takes low dose beta blocker from cardiology

## 2023-09-28 NOTE — Assessment & Plan Note (Addendum)
  Reviewed health habits including diet and exercise and skin cancer prevention Reviewed appropriate screening tests for age  Also reviewed health mt list, fam hx and immunization status , as well as social and family history   See HPI Labs reviewed and ordered Flu shot given  Mammogram 07/2023 Colonoscopy 12/2021 with 5 y recall  Discussed fall prevention, supplements and exercise for bone density  PHQ 0

## 2023-09-28 NOTE — Assessment & Plan Note (Signed)
Lab Results  Component Value Date   HGBA1C 5.9 09/21/2023   disc imp of low glycemic diet and wt loss to prevent DM2

## 2023-09-28 NOTE — Progress Notes (Signed)
Subjective:    Patient ID: Mindy Long, female    DOB: [birth year], 62 y.o.   MRN: [id number]  HPI  Here for health maintenance exam and to review chronic medical problems   Wt Readings from Last 3 Encounters:  09/28/23 170 lb 2 oz (77.2 kg)  06/30/23 171 lb (77.6 kg)  10/07/22 171 lb (77.6 kg)   31.37 kg/m  Vitals:   09/28/23 1108  BP: 128/78  Pulse: 96  Temp: 98.2 F (36.8 C)  SpO2: 98%    Immunization History  Administered Date(s) Administered   Influenza Split 11/04/2011   Influenza, Seasonal, Injecte, Preservative Fre 09/28/2023   Influenza,inj,Quad PF,6+ Mos 11/27/2013, 11/13/2014, 09/23/2016, 08/15/2018, 08/22/2019, 08/22/2020, 09/23/2021, 09/24/2022   Moderna Covid-19 Vaccine Bivalent Booster 22yrs & up 12/06/2021   PFIZER Comirnaty(Gray Top)Covid-19 Tri-Sucrose Vaccine 06/13/2021, 08/30/2022   PFIZER(Purple Top)SARS-COV-2 Vaccination 02/29/2020, 03/25/2020   Td 06/20/2009   Tdap 08/22/2019   Zoster Recombinant(Shingrix) 07/29/2022, 10/14/2022    There are no preventive care reminders to display for this patient.  Feels ok  Some days more tired than others  May not get enough sleep   Pulsation in r ear  Had neg carotid US in 2017     Some boils on /off /prone to them     Flu shot -today    Mammogram- 07/2023  Self breast exam- no lumps   Gyn health Hysterectomy in 2002  No problems    Colon cancer screening -colonoscopy 12/2021  5 y recall   Bone health   Falls-none Fractures-none  Supplements  Last vitamin D Lab Results  Component Value Date   VD25OH 26.96 (L) 09/21/2023  Takes 2000 international units daily   Exercise :  Going to the gym  Some weights /core work and still swims also  2-3 times per week      Mood    09/28/2023   11:54 AM 09/24/2022   11:59 AM 09/23/2021    3:55 PM 08/22/2020   12:57 PM 08/22/2019    3:34 PM  Depression screen PHQ 2/9  Decreased Interest 0 0 0 0 0  Down, Depressed, Hopeless 0 0  0 0 0  PHQ - 2 Score 0 0 0 0 0  Altered sleeping 1 0 1 0 0  Tired, decreased energy 0 0 0 0 0  Change in appetite 0 0 0 0 0  Feeling bad or failure about yourself  0 0 0 0 0  Trouble concentrating 0 0 0 0 0  Moving slowly or fidgety/restless 0 0 0 0 0  Suicidal thoughts 0 0 0 0 0  PHQ-9 Score 1 0 1 0 0  Difficult doing work/chores Not difficult at all Not difficult at all Not difficult at all Not difficult at all     HTN bp is stable today  No cp or palpitations or headaches or edema  No side effects to medicines  BP Readings from Last 3 Encounters:  09/28/23 128/78  06/30/23 118/72  10/07/22 128/80     Metoprolol 12.5 mg bid Amlodipine 5 mg daily   Lab Results  Component Value Date   NA 141 09/21/2023   K 3.6 09/21/2023   CO2 27 09/21/2023   GLUCOSE 93 09/21/2023   BUN 12 09/21/2023   CREATININE 0.88 09/21/2023   CALCIUM 9.2 09/21/2023   GFR 69.49 09/21/2023   GFRNONAA >60 10/22/2021     History of aortic atherosclerosis   History of OSA - years ago  Did not  need treatment then  Some days more energy than others     GERD Omepraaole 20 mg every day  Needs this to control her symptoms    A lot of different foods trigger her   Lab Results  Component Value Date   VITAMINB12 424 09/21/2023       History of thyroid nodule [place] 02/2023 noted stability and no follow up needed  Lab Results  Component Value Date   TSH 1.92 09/21/2023    DM screen Lab Results  Component Value Date   HGBA1C 5.9 09/21/2023  Prediabetic   She craves sweets but tries to stay away from   Fair amt of ginger ale      Hyperlipidemia Lab Results  Component Value Date   CHOL 212 (H) 09/21/2023   CHOL 218 (H) 09/17/2022   CHOL 213 (H) 09/18/2021   Lab Results  Component Value Date   HDL 78.20 09/21/2023   HDL 84.00 09/17/2022   HDL 83.00 09/18/2021   Lab Results  Component Value Date   LDLCALC 120 (H) 09/21/2023   LDLCALC 122 (H) 09/17/2022   LDLCALC 117 (H)  09/18/2021   Lab Results  Component Value Date   TRIG 69.0 09/21/2023   TRIG 59.0 09/17/2022   TRIG 66.0 09/18/2021   Lab Results  Component Value Date   CHOLHDL 3 09/21/2023   CHOLHDL 3 09/17/2022   CHOLHDL 3 09/18/2021   Lab Results  Component Value Date   LDLDIRECT 126.7 10/23/2011   LDLDIRECT 132.9 06/20/2009   LDLDIRECT 109.4 01/25/2008   The 10-year ASCVD risk score (Arnett DK, et al., 2019) is: 8.2%   Values used to calculate the score:     Age: 87 years     Sex: Female     Is Non-Hispanic African American: Yes     Diabetic: No     Tobacco smoker: No     Systolic Blood Pressure: 128 mmHg     Is BP treated: Yes     HDL Cholesterol: 78.2 mg/dL     Total Cholesterol: 212 mg/dL  Lab Results  Component Value Date   WBC 4.8 09/21/2023   HGB 13.1 09/21/2023   HCT 41.9 09/21/2023   MCV 78.2 09/21/2023   PLT 261.0 09/21/2023   Lab Results  Component Value Date   ALT 11 09/21/2023   AST 15 09/21/2023   ALKPHOS 91 09/21/2023   BILITOT 0.7 09/21/2023      Patient Active Problem List   Diagnosis Date Noted   Prediabetes 09/28/2023   Current use of proton pump inhibitor 09/20/2023   Aortic atherosclerosis (HCC) 10/07/2022   Pure hypercholesterolemia 10/07/2022   Skin hypopigmentation 09/24/2022   Polyp of ascending colon    PSVT (paroxysmal supraventricular tachycardia) (HCC) 10/01/2021   Thyroid nodule 09/23/2021   Abnormal serum ACE level 04/08/2018   Eye exam abnormal 08/04/2017   Tinnitus, right ear 11/13/2014   History of shingles 04/27/2014   Low back pain 04/20/2014   OSA (obstructive sleep apnea) 09/27/2012   Snoring 06/21/2012   Fatigue 06/20/2012   Colon cancer screening 11/04/2011   Routine general medical examination at a health care facility 10/22/2011   Vitamin D deficiency 07/02/2010   Palpitations 04/07/2010   HYPOKALEMIA 07/10/2009   Allergic rhinitis 04/03/2008   Essential hypertension 01/25/2008   G E R D 07/05/2007   H/O peptic  ulcer 07/05/2007   Headache, chronic daily 07/05/2007   NEPHROLITHIASIS, HX OF 07/05/2007   Past Medical History:  Diagnosis  Date   Back pain    Chronic back pain    GERD (gastroesophageal reflux disease)    Headache(784.0)    History of COVID-19    History of kidney stones    History of nephrolithiasis    Hypertension    Plantar fasciitis    PSVT (paroxysmal supraventricular tachycardia) (HCC)    Pulmonary nodules    Sleep apnea    Ulcer 1980's   peptic ulcer disease   Past Surgical History:  Procedure Laterality Date   ABDOMINAL HYSTERECTOMY  2002   CHOLECYSTECTOMY  1984   COLONOSCOPY  12/18/2011   Normal   COLONOSCOPY WITH PROPOFOL N/A 12/23/2021   Procedure: COLONOSCOPY WITH PROPOFOL;  Surgeon: Midge Minium, MD;  Location: [facility];  Service: Endoscopy;  Laterality: N/A;   IR ANGIO EXTERNAL CAROTID SEL EXT CAROTID BILAT MOD SED  11/15/2018   IR ANGIO INTRA EXTRACRAN SEL INTERNAL CAROTID BILAT MOD SED  11/15/2018   IR ANGIO VERTEBRAL SEL VERTEBRAL UNI R MOD SED  11/15/2018   IR US GUIDE VASC ACCESS RIGHT  11/15/2018   LIPOMA EXCISION Left 05/20/2021   Procedure: EXCISION LIPOMA OF LEFT BUTTOCK;  Surgeon: Luretha Murphy, MD;  Location: [facility];  Service: General;  Laterality: Left;   OOPHORECTOMY     Social History   Tobacco Use   Smoking status: Never   Smokeless tobacco: Never  Vaping Use   Vaping status: Never Used  Substance Use Topics   Alcohol use: Yes    Comment: occas.   Drug use: No   Family History  Problem Relation Age of Onset   Stroke Father    Hypertension Father    Diabetes Father    Cancer Brother        stomach tumor   Allergies  Allergen Reactions   Atenolol Cough   Hydrocodone Nausea Only   Current Outpatient Medications on File Prior to Visit  Medication Sig Dispense Refill   Cholecalciferol (VITAMIN D3) 2000 UNITS capsule Take 4,000 Units by mouth daily.     gabapentin (NEURONTIN) 300 MG capsule Take 300 mg by mouth 3 (three)  times daily as needed (pain).     omeprazole (PRILOSEC) 20 MG capsule TAKE 1 CAPSULE BY MOUTH DAILY AS NEEDED. 90 capsule 1   Ruxolitinib Phosphate (OPZELURA) 1.5 % CREA Apply 1 Application topically daily as needed.     tiZANidine (ZANAFLEX) 4 MG tablet TAKE 1 TABLET (4 MG TOTAL) BY MOUTH EVERY 8 (EIGHT) HOURS AS NEEDED FOR MUSCLE SPASMS 30 tablet 0   No current facility-administered medications on file prior to visit.    Review of Systems  Constitutional:  Negative for activity change, appetite change, fatigue, fever and unexpected weight change.  HENT:  Positive for tinnitus. Negative for congestion, ear pain, rhinorrhea, sinus pressure and sore throat.   Eyes:  Negative for pain, redness and visual disturbance.  Respiratory:  Negative for cough, shortness of breath and wheezing.   Cardiovascular:  Negative for chest pain and palpitations.  Gastrointestinal:  Negative for abdominal pain, blood in stool, constipation and diarrhea.  Endocrine: Negative for polydipsia and polyuria.  Genitourinary:  Negative for dysuria, frequency and urgency.  Musculoskeletal:  Negative for arthralgias, back pain and myalgias.  Skin:  Negative for pallor and rash.       Occational gets boild   Allergic/Immunologic: Negative for environmental allergies.  Neurological:  Negative for dizziness, syncope and headaches.  Hematological:  Negative for adenopathy. Does not bruise/bleed easily.  Psychiatric/Behavioral:  Negative  for decreased concentration and dysphoric mood. The patient is not nervous/anxious.        Objective:   Physical Exam Constitutional:      General: She is not in acute distress.    Appearance: Normal appearance. She is well-developed. She is obese. She is not ill-appearing or diaphoretic.  HENT:     Head: Normocephalic and atraumatic.     Right Ear: Tympanic membrane, ear canal and external ear normal.     Left Ear: Tympanic membrane, ear canal and external ear normal.     Nose:  Nose normal. No congestion.     Mouth/Throat:     Mouth: Mucous membranes are moist.     Pharynx: Oropharynx is clear. No posterior oropharyngeal erythema.  Eyes:     General: No scleral icterus.    Extraocular Movements: Extraocular movements intact.     Conjunctiva/sclera: Conjunctivae normal.     Pupils: Pupils are equal, round, and reactive to light.  Neck:     Thyroid: No thyromegaly.     Vascular: No carotid bruit or JVD.  Cardiovascular:     Rate and Rhythm: Normal rate and regular rhythm.     Pulses: Normal pulses.     Heart sounds: Normal heart sounds.     No gallop.  Pulmonary:     Effort: Pulmonary effort is normal. No respiratory distress.     Breath sounds: Normal breath sounds. No wheezing.     Comments: Good air exch Chest:     Chest wall: No tenderness.  Abdominal:     General: Bowel sounds are normal. There is no distension or abdominal bruit.     Palpations: Abdomen is soft. There is no mass.     Tenderness: There is no abdominal tenderness.     Hernia: No hernia is present.  Genitourinary:    Comments: Breast exam: No mass, nodules, thickening, tenderness, bulging, retraction, inflamation, nipple discharge or skin changes noted.  No axillary or clavicular LA.     Musculoskeletal:        General: No tenderness. Normal range of motion.     Cervical back: Normal range of motion and neck supple. No rigidity. No muscular tenderness.     Right lower leg: No edema.     Left lower leg: No edema.     Comments: No kyphosis   Lymphadenopathy:     Cervical: No cervical adenopathy.  Skin:    General: Skin is warm and dry.     Coloration: Skin is not pale.     Findings: No erythema or rash.     Comments: No boils or skin infection   Neurological:     Mental Status: She is alert. Mental status is at baseline.     Cranial Nerves: No cranial nerve deficit.     Motor: No abnormal muscle tone.     Coordination: Coordination normal.     Gait: Gait normal.     Deep  Tendon Reflexes: Reflexes are normal and symmetric. Reflexes normal.  Psychiatric:        Mood and Affect: Mood normal.        Cognition and Memory: Cognition and memory normal.           Assessment & Plan:   Problem List Items Addressed This Visit       Cardiovascular and Mediastinum   Aortic atherosclerosis (HCC)    No symptoms Continue to work on good blood pressure and chol control  Relevant Medications   amLODipine (NORVASC) 5 MG tablet   metoprolol tartrate (LOPRESSOR) 25 MG tablet   Essential hypertension    bp in fair control at this time  BP Readings from Last 1 Encounters:  09/28/23 128/78   No changes needed Most recent labs reviewed  Disc lifstyle change with low sodium diet and exercise  Plan to continue metoprolol 12.5 mg bid amloidpine 5 mg daily       Relevant Medications   amLODipine (NORVASC) 5 MG tablet   metoprolol tartrate (LOPRESSOR) 25 MG tablet   PSVT (paroxysmal supraventricular tachycardia) (HCC)    No palpitations reported Takes low dose beta blocker from cardiology      Relevant Medications   amLODipine (NORVASC) 5 MG tablet   metoprolol tartrate (LOPRESSOR) 25 MG tablet     Digestive   G E R D    Takes omeprazole 20 mg daily        Endocrine   Thyroid nodule    Last [place] indicated no need for follow up  No clinical or exam changes       Relevant Medications   metoprolol tartrate (LOPRESSOR) 25 MG tablet     Other   Colon cancer screening    Colonoscopy 12/2021 with 5 y recall       Current use of proton pump inhibitor   RESOLVED: Diabetes mellitus screening    Lab Results  Component Value Date   HGBA1C 5.9 09/21/2023   disc imp of low glycemic diet and wt loss to prevent DM2       Prediabetes    Lab Results  Component Value Date   HGBA1C 5.9 09/21/2023   disc imp of low glycemic diet and wt loss to prevent DM2       Pure hypercholesterolemia    Disc goals for lipids and reasons to control them Rev  last labs with pt Rev low sat fat diet in detail Stable LDL of 120        Relevant Medications   amLODipine (NORVASC) 5 MG tablet   metoprolol tartrate (LOPRESSOR) 25 MG tablet   Routine general medical examination at a health care facility - Primary     Reviewed health habits including diet and exercise and skin cancer prevention Reviewed appropriate screening tests for age  Also reviewed health mt list, fam hx and immunization status , as well as social and family history   See HPI Labs reviewed and ordered Flu shot given  Mammogram 07/2023 Colonoscopy 12/2021 with 5 y recall  Discussed fall prevention, supplements and exercise for bone density  PHQ 0         Tinnitus, right ear    Worsening -right ear/pulsitile Had carotids checked in 2017 No carotid bruit on exam Hearing grossly normal  Req ref to ENT in [place] if possible       Vitamin D deficiency    Last vitamin D Lab Results  Component Value Date   VD25OH 26.96 (L) 09/21/2023   Takes ppi Instructed to increase to 4000 international units D3 daily for bone and overall health      Other Visit Diagnoses     Need for influenza vaccination       Relevant Orders   Flu vaccine trivalent PF, 6mos and older(Flulaval,Afluria,Fluarix,Fluzone) (Completed)

## 2023-09-29 ENCOUNTER — Encounter: Payer: Self-pay | Admitting: *Deleted

## 2023-11-10 ENCOUNTER — Encounter: Payer: Self-pay | Admitting: Family Medicine

## 2023-11-10 DIAGNOSIS — H35033 Hypertensive retinopathy, bilateral: Secondary | ICD-10-CM | POA: Diagnosis not present

## 2023-11-16 ENCOUNTER — Telehealth: Payer: Self-pay | Admitting: Family Medicine

## 2023-11-16 DIAGNOSIS — H9311 Tinnitus, right ear: Secondary | ICD-10-CM

## 2023-11-16 NOTE — Telephone Encounter (Signed)
I placed another referral  Please let referral team know

## 2023-11-16 NOTE — Telephone Encounter (Signed)
Pt called stating she tried scheduling an appt at the ENT office & was told her referral had expired due to not scheduling within 30 days. Pt asked could another ENT be submitted? Call back # [phone]

## 2023-12-29 NOTE — Progress Notes (Deleted)
  Cardiology Office Note:  .   Date:  12/29/2023  ID:  Mindy Long, DOB 1961-10-10, MRN [id number] PCP: Judy Pimple, MD  [facility] Providers Cardiologist:  Yvonne Kendall, MD { Click to update primary MD,subspecialty MD or APP then REFRESH:1}    History of Present Illness: .   Mindy Long is a 63 y.o. female with history of chest pain with normal coronary CTA in 10/2021, PSVT, aortic atherosclerosis, hypertension, hyperlipidemia, GERD, sleep apnea, and kidney stones, who returns for follow-up of chest pain and palpitations.  I last saw her in 10/2022, at which time she reported only rare episodes of atypical chest pain at rest.  She remained active without exertional symptoms.  We did not make any medication changes or pursue additional testing.  ROS: See HPI  Studies Reviewed: .        *** Risk Assessment/Calculations:   {Does this patient have ATRIAL FIBRILLATION?[phone]} No BP recorded.  {Refresh Note OR Click here to enter BP  :1}***       Physical Exam:   VS:  There were no vitals taken for this visit.   Wt Readings from Last 3 Encounters:  09/28/23 170 lb 2 oz (77.2 kg)  06/30/23 171 lb (77.6 kg)  10/07/22 171 lb (77.6 kg)    General:  NAD. Neck: No JVD or HJR. Lungs: Clear to auscultation bilaterally without wheezes or crackles. Heart: Regular rate and rhythm without murmurs, rubs, or gallops. Abdomen: Soft, nontender, nondistended. Extremities: No lower extremity edema.  ASSESSMENT AND PLAN: .    ***    {Are you ordering a CV Procedure (e.g. stress test, cath, DCCV, TEE, etc)?   Press F2        :[id number]}  Dispo: ***  Signed, Yvonne Kendall, MD

## 2023-12-30 ENCOUNTER — Ambulatory Visit: Payer: BC Managed Care – PPO | Admitting: Internal Medicine

## 2024-01-03 ENCOUNTER — Telehealth: Payer: Self-pay | Admitting: Family Medicine

## 2024-01-03 NOTE — Telephone Encounter (Signed)
Called and spoke with patient, she is wanting ENT referral sent to different practice. Patient is going to do some research to see where she wants to go and will update [place].

## 2024-01-03 NOTE — Telephone Encounter (Signed)
Copied from CRM [id number]. Topic: Referral - Question >> Jan 03, 2024  3:42 PM Deaijah H wrote: Reason for CRM: Patient Called in wanting to know if she could receive another referral due to previous one not working out / if needed, please call [phone]

## 2024-01-06 ENCOUNTER — Telehealth: Payer: Self-pay | Admitting: Family Medicine

## 2024-01-06 NOTE — Telephone Encounter (Signed)
Copied from CRM [id number]. Topic: Referral - Status >> Jan 05, 2024  4:59 PM Tiffany H wrote: Reason for CRM: Patient called to provide updated information regarding preferred ENT. Patient prefers:   Dr. Andrew [facility]  Phone: [phone]  Fax: [phone]

## 2024-01-07 ENCOUNTER — Encounter: Payer: Self-pay | Admitting: *Deleted

## 2024-01-07 NOTE — Telephone Encounter (Signed)
Noted.   Referral updated.   See referral notes for further information

## 2024-01-21 ENCOUNTER — Other Ambulatory Visit: Payer: Self-pay | Admitting: Family Medicine

## 2024-01-21 NOTE — Telephone Encounter (Signed)
Last filled on 06/07/23 #30 tabs/ 0 refills   CPE was on 09/28/23

## 2024-01-27 NOTE — Progress Notes (Unsigned)
Cardiology Office Note:  .   Date:  01/28/2024  ID:  Mindy Long, DOB 1960-12-14, MRN [id number] PCP: Judy Pimple, MD  [facility] Providers Cardiologist:  Yvonne Kendall, MD     History of Present Illness: .   Mindy Long is a 63 y.o. female with history of chest pain with normal coronary CTA in 10/2021, PSVT, aortic atherosclerosis, hypertension, hyperlipidemia, GERD, sleep apnea, and kidney stones, who returns for follow-up of chest pain and palpitations.  I last saw her in 10/2022, at which time she reported only rare episodes of atypical chest pain at rest.  She remained active without exertional symptoms.  We did not make any medication changes or pursue additional testing.  Today, Ms. Kovich reports that she has been feeling fairly well.  She continues to have sporadic palpitations that she describes as "flutters" that last less than 5 seconds at a time.  They happen about 3-4 times a week and are typically self-limited.  There are no associated symptoms.  She also denies chest pain, shortness of breath, lightheadedness, and edema at other times.  Her only other concern is of pulsatile tinnitus that has been worked up in the past but seems to be getting worse.  She is undergoing ENT evaluation of this again.  ROS: See HPI  Studies Reviewed: Marland Kitchen   EKG Interpretation Date/Time:  Friday January 28 2024 08:58:20 EST Ventricular Rate:  87 PR Interval:  132 QRS Duration:  68 QT Interval:  350 QTC Calculation: 421 R Axis:   -4  Text Interpretation: Normal sinus rhythm Minimal voltage criteria for LVH, may be normal variant ( R in aVL ) Nonspecific T wave abnormality When compared with ECG of 07-Oct-2022 No significant change was found Confirmed by Calyn Sivils, Cristal Deer [id number]) on 01/28/2024 9:04:09 AM    Risk Assessment/Calculations:             Physical Exam:   VS:  BP 124/84   Pulse 87   Ht 5\' 3"  (1.6 m)   Wt 171 lb 12.8 oz (77.9 kg)   SpO2 99%   BMI 30.43  kg/m    Wt Readings from Last 3 Encounters:  01/28/24 171 lb 12.8 oz (77.9 kg)  09/28/23 170 lb 2 oz (77.2 kg)  06/30/23 171 lb (77.6 kg)    General:  NAD. Neck: No JVD or HJR. Lungs: Clear to auscultation bilaterally without wheezes or crackles. Heart: Regular rate and rhythm without murmurs, rubs, or gallops. Abdomen: Soft, nontender, nondistended. Extremities: No lower extremity edema.  ASSESSMENT AND PLAN: .    PSVT: Brief palpitations likely represent runs of PSVT noted on prior event monitor.  Given that blood pressure and heart rate are in the upper normal range today, we have agreed to increase metoprolol titrate to 25 mg twice daily to see if we can reduce her palpitations.  No further workup recommended at this time.  Hypertension: Diastolic blood pressure borderline elevated today.  Will increase metoprolol to tartrate 25 mg twice daily.  Continue amlodipine 5 mg daily.  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  I encouraged Ms. Gullatt to continue follow-up with the ENT.  Aortic atherosclerosis: Incidentally noted on prior cross-sectional imaging of the aorta.  LDL mildly elevated on last check.  I encouraged Ms. Angelillo to work on lifestyle modifications to help improve her lipid profile and prevent progression of her ASCVD.     Dispo: Return to clinic in 6 months.  Signed, Yvonne Kendall, MD

## 2024-01-28 ENCOUNTER — Encounter: Payer: Self-pay | Admitting: Internal Medicine

## 2024-01-28 ENCOUNTER — Ambulatory Visit: Payer: BC Managed Care – PPO | Attending: Internal Medicine | Admitting: Internal Medicine

## 2024-01-28 VITALS — BP 124/84 | HR 87 | Ht 63.0 in | Wt 171.8 lb

## 2024-01-28 DIAGNOSIS — I10 Essential (primary) hypertension: Secondary | ICD-10-CM | POA: Diagnosis not present

## 2024-01-28 DIAGNOSIS — R0789 Other chest pain: Secondary | ICD-10-CM | POA: Diagnosis not present

## 2024-01-28 DIAGNOSIS — I471 Supraventricular tachycardia, unspecified: Secondary | ICD-10-CM

## 2024-01-28 DIAGNOSIS — H93A9 Pulsatile tinnitus, unspecified ear: Secondary | ICD-10-CM | POA: Insufficient documentation

## 2024-01-28 DIAGNOSIS — I700 Atherosclerosis of aorta: Secondary | ICD-10-CM

## 2024-01-28 MED ORDER — METOPROLOL TARTRATE 25 MG PO TABS: 25.0000 mg | ORAL_TABLET | Freq: Two times a day (BID) | ORAL | 1 refills | Status: AC

## 2024-01-28 NOTE — Patient Instructions (Signed)
Medication Instructions:  INCREASE the Metoprolol Tartrate to 25 mg twice daily  *If you need a refill on your cardiac medications before your next appointment, please call your pharmacy*   Lab Work: None ordered If you have labs (blood work) drawn today and your tests are completely normal, you will receive your results only by: MyChart Message (if you have MyChart) OR A paper copy in the mail If you have any lab test that is abnormal or we need to change your treatment, we will call you to review the results.   Testing/Procedures: None ordered   Follow-Up: At [facility], you and your health needs are our priority.  As part of our continuing mission to provide you with exceptional heart care, we have created designated Provider Care Teams.  These Care Teams include your primary Cardiologist (physician) and Advanced Practice Providers (APPs -  Physician Assistants and Nurse Practitioners) who all work together to provide you with the care you need, when you need it.  We recommend signing up for the patient portal called "MyChart".  Sign up information is provided on this After Visit Summary.  MyChart is used to connect with patients for Virtual Visits (Telemedicine).  Patients are able to view lab/test results, encounter notes, upcoming appointments, etc.  Non-urgent messages can be sent to your provider as well.   To learn more about what you can do with MyChart, go to [email or web address].    Your next appointment:   6 month(s)  Provider:   You may see Yvonne Kendall, MD or one of the following Advanced Practice Providers on your designated Care Team:   Nicolasa Ducking, NP Eula Listen, PA-C Cadence Fransico Michael, PA-C Charlsie Quest, NP Carlos Levering, NP

## 2024-02-04 ENCOUNTER — Telehealth: Payer: Self-pay

## 2024-02-04 NOTE — Telephone Encounter (Signed)
 Copied from CRM [id number]. Topic: General - Other >> Feb 04, 2024  3:28 PM Irine Seal wrote: Reason for CRM: Patient called requesting a letter to excuse her from jury duty due to back issues that prevent her from sitting for long periods of time. She needs the letter to present to the clerk. Contact number: [phone].

## 2024-02-04 NOTE — Telephone Encounter (Signed)
 They usually want more details in the letter-    How long can she sit for without getting up ?  What does she do to alleviate back pain?  How long / distance or minutes can she walk for?  How bad is the pain and where is it located most recently ?  Thanks

## 2024-02-07 NOTE — Telephone Encounter (Signed)
 Letter done Please print and I will sign  She may be able to access on [facility]

## 2024-02-07 NOTE — Telephone Encounter (Signed)
 How long can she sit for without getting up ? Pt can sit for about 4-5 min before having to stretch and move  What does she do to alleviate back pain? Pt has pain patches from the pharmacy. She uses gabapentin, celebrex, and zanaflex also  How long / distance or minutes can she walk for? Pt said she can walk on average for about 5 min before having to stop  How bad is the pain and where is it located most recently ? Pt said that the pain varies day to day, some days it's severe and she can't walk/sit much without pain and some days it's manageable so she can't really answer that question with one answer. Pt did say the pain is mainly in her lower back

## 2024-02-08 NOTE — Telephone Encounter (Signed)
Pt notified letter ready for pick up. 

## 2024-04-07 ENCOUNTER — Ambulatory Visit: Payer: Self-pay | Admitting: *Deleted

## 2024-04-07 NOTE — Telephone Encounter (Signed)
 Will see her then Agree with ER/UC precautions

## 2024-04-07 NOTE — Telephone Encounter (Signed)
 Copied from CRM [id number]. Topic: Clinical - Red Word Triage >> Apr 07, 2024  9:19 AM Kita Perish H wrote: [facility] that prompted transfer to Nurse Triage: Sudden unbearable pain in knees, toes and fingers, can't do anything with right thumb if she moves it excruciating, when walking painful Reason for Disposition  [1] MODERATE pain (e.g., interferes with normal activities, limping) AND [2] present > 3 days  Answer Assessment - Initial Assessment Questions 1. LOCATION and RADIATION: "Where is the pain located?"      I'm having generaled joint pain all over that started in my knees.   3 weeks ago I dropped my phone on left foot.   A week after that my left knee started hurting.   It hurt to walk.  It's been unbearable to walk due to my knee. Last week my right toes starting hurting.   This week my right thumb is hurting.  It's hard to use my right hand.   Why all of a sudden this all started. 2. QUALITY: "What does the pain feel like?"  (e.g., sharp, dull, aching, burning)     It's aching but it also feels in my thumb it's so bad. 3. SEVERITY: "How bad is the pain?" "What does it keep you from doing?"   (Scale 1-10; or mild, moderate, severe)   -  MILD (1-3): doesn't interfere with normal activities    -  MODERATE (4-7): interferes with normal activities (e.g., work or school) or awakens from sleep, limping    -  SEVERE (8-10): excruciating pain, unable to do any normal activities, unable to walk     Severe pain in thumb 4. ONSET: "When did the pain start?" "Does it come and go, or is it there all the time?"     This week my thumb is hurting the most 5. RECURRENT: "Have you had this pain before?" If Yes, ask: "When, and what happened then?"     No 6. SETTING: "Has there been any recent work, exercise or other activity that involved that part of the body?"      No 7. AGGRAVATING FACTORS: "What makes the knee pain worse?" (e.g., walking, climbing stairs, running)     Walking, moving 8. ASSOCIATED  SYMPTOMS: "Is there any swelling or redness of the knee?"     No 9. OTHER SYMPTOMS: "Do you have any other symptoms?" (e.g., chest pain, difficulty breathing, fever, calf pain)     Yesterday my left hand I could not bend it all the way because it was swollen.   It did go down. 10. PREGNANCY: "Is there any chance you are pregnant?" "When was your last menstrual period?"       N/A due to age  Protocols used: Knee Pain-A-AH  Chief Complaint: Generalized joint pain especially in thumb Symptoms: aching pain in knees, foot and thumb Frequency: For 3 weeks Pertinent Negatives: Patient denies knowing reason Disposition: [] ED /[] Urgent Care (no appt availability in office) / [x] Appointment(In office/virtual)/ []  [facility]/ [] Home Care/ [] Refused Recommended Disposition /[] [facility]/ []  Follow-up with PCP Additional Notes: Appt made with Dr. Malissa Se for 5/5/ at 12:00.

## 2024-04-10 ENCOUNTER — Encounter: Payer: Self-pay | Admitting: Family Medicine

## 2024-04-10 ENCOUNTER — Ambulatory Visit (INDEPENDENT_AMBULATORY_CARE_PROVIDER_SITE_OTHER): Admitting: Family Medicine

## 2024-04-10 ENCOUNTER — Ambulatory Visit (INDEPENDENT_AMBULATORY_CARE_PROVIDER_SITE_OTHER)
Admission: RE | Admit: 2024-04-10 | Discharge: 2024-04-10 | Disposition: A | Discharge status: Home / Self Care | Source: Ambulatory Visit | Attending: Family Medicine | Admitting: Family Medicine

## 2024-04-10 VITALS — BP 140/92 | HR 72 | Temp 98.0°F | Ht 63.0 in | Wt 171.1 lb

## 2024-04-10 DIAGNOSIS — M79644 Pain in right finger(s): Secondary | ICD-10-CM | POA: Diagnosis not present

## 2024-04-10 DIAGNOSIS — M1712 Unilateral primary osteoarthritis, left knee: Secondary | ICD-10-CM

## 2024-04-10 DIAGNOSIS — M79641 Pain in right hand: Secondary | ICD-10-CM | POA: Diagnosis not present

## 2024-04-10 DIAGNOSIS — M25562 Pain in left knee: Secondary | ICD-10-CM | POA: Diagnosis not present

## 2024-04-10 DIAGNOSIS — M79672 Pain in left foot: Secondary | ICD-10-CM | POA: Diagnosis not present

## 2024-04-10 DIAGNOSIS — M2550 Pain in unspecified joint: Secondary | ICD-10-CM | POA: Insufficient documentation

## 2024-04-10 DIAGNOSIS — M19041 Primary osteoarthritis, right hand: Secondary | ICD-10-CM | POA: Diagnosis not present

## 2024-04-10 DIAGNOSIS — M19072 Primary osteoarthritis, left ankle and foot: Secondary | ICD-10-CM | POA: Diagnosis not present

## 2024-04-10 LAB — CBC WITH DIFFERENTIAL/PLATELET
Basophils Absolute: 0 10*3/uL (ref 0.0–0.1)
Basophils Relative: 0.8 % (ref 0.0–3.0)
Eosinophils Absolute: 0.1 10*3/uL (ref 0.0–0.7)
Eosinophils Relative: 2.5 % (ref 0.0–5.0)
HCT: 42.3 % (ref 36.0–46.0)
Hemoglobin: 13.5 g/dL (ref 12.0–15.0)
Lymphocytes Relative: 41.9 % (ref 12.0–46.0)
Lymphs Abs: 1.7 10*3/uL (ref 0.7–4.0)
MCHC: 31.9 g/dL (ref 30.0–36.0)
MCV: 78.3 fl (ref 78.0–100.0)
Monocytes Absolute: 0.3 10*3/uL (ref 0.1–1.0)
Monocytes Relative: 6.4 % (ref 3.0–12.0)
Neutro Abs: 2 10*3/uL (ref 1.4–7.7)
Neutrophils Relative %: 48.4 % (ref 43.0–77.0)
Platelets: 245 10*3/uL (ref 150.0–400.0)
RBC: 5.4 Mil/uL — ABNORMAL HIGH (ref 3.87–5.11)
RDW: 14.5 % (ref 11.5–15.5)
WBC: 4.1 10*3/uL (ref 4.0–10.5)

## 2024-04-10 LAB — SEDIMENTATION RATE: Sed Rate: 27 mm/h (ref 0–30)

## 2024-04-10 NOTE — Progress Notes (Signed)
 Subjective:    Patient ID: Mindy Long, female    DOB: Sep 11, 1961, 63 y.o.   MRN: [id number]  HPI  Wt Readings from Last 3 Encounters:  04/10/24 171 lb 2 oz (77.6 kg)  01/28/24 171 lb 12.8 oz (77.9 kg)  09/28/23 170 lb 2 oz (77.2 kg)   30.31 kg/m  Vitals:   04/10/24 1203  BP: (!) 140/92  Pulse: 72  Temp: 98 F (36.7 C)  SpO2: 100%    Pt presents with joint pains   Hand/thumb right (is right handed)- about 2 weeks  No new trauma  Middle joint  Not swollen Not red or hot  Worst to reach across her palm or grip  Best to hold in neutral position   One am other hand was swollen/better now   Knee left - 3 weeks  Much of the pain is lateral  No trauma No old surgeries  No swelling / redness or heat  Hurts more bend than extend knee   Doing yoga or swimming for a while - does not seem to bother    Foot  -both /esp big toes -since dropped the phone  Middle toes-worse with plantar flex Worse to walk/put pressure on  All shoes hurt  Worse in a shoe / a little better to be barefoot    Has not been able to do her zumba    Dropped phone on left foot about a month ago    Meds Tizanidine   Celebrex  Has not tried ice or heat   Not helping    No auto immune joint dz in family   No fevers or rashes   DG Foot Complete Left Result Date: 04/10/2024 CLINICAL DATA:  Left foot pain. EXAM: LEFT FOOT - COMPLETE 3+ VIEW COMPARISON:  04/17/2013. FINDINGS: There is no evidence of acute fracture or dislocation. Mild degenerative changes of the first MTP joint. TMT joints are anatomically aligned. Soft tissues are grossly unremarkable. IMPRESSION: No acute osseous abnormality. Electronically Signed   By: Mannie Seek M.D.   On: 04/10/2024 13:50   DG Knee 4 Views W/Patella Left Result Date: 04/10/2024 CLINICAL DATA:  Left knee pain. EXAM: LEFT KNEE - COMPLETE 4+ VIEW COMPARISON:  07/28/2012. FINDINGS: No evidence of acute fracture, dislocation, or joint effusion.  Mild medial compartment joint space narrowing. Soft tissues are grossly unremarkable. IMPRESSION: 1. No acute osseous abnormality. 2. Mild degenerative changes of the left knee. Electronically Signed   By: Mannie Seek M.D.   On: 04/10/2024 13:47   DG Hand Complete Right Result Date: 04/10/2024 CLINICAL DATA:  Hand pain. EXAM: RIGHT HAND - COMPLETE 3+ VIEW COMPARISON:  None Available. FINDINGS: There is no evidence of acute fracture or dislocation. Mild first CMC and triscaphe joint space narrowing. Mild joint space narrowing and osteophytosis of the first through fifth MCP joints. Mild diffuse interphalangeal joint space narrowing. No evidence of osseous erosions. Soft tissues are grossly unremarkable. IMPRESSION: 1. No acute osseous abnormality. 2. Mild degenerative changes of the hand, as above. Electronically Signed   By: Mannie Seek M.D.   On: 04/10/2024 13:46       Patient Active Problem List   Diagnosis Date Noted   Thumb pain, right 04/10/2024   Left knee pain 04/10/2024   Left foot pain 04/10/2024   Multiple joint pain 04/10/2024   Pulsatile tinnitus 01/28/2024   Prediabetes 09/28/2023   Current use of proton pump inhibitor 09/20/2023   Aortic atherosclerosis (HCC) 10/07/2022   Pure hypercholesterolemia  10/07/2022   Skin hypopigmentation 09/24/2022   Polyp of ascending colon    PSVT (paroxysmal supraventricular tachycardia) (HCC) 10/01/2021   Thyroid  nodule 09/23/2021   Abnormal serum ACE level 04/08/2018   Eye exam abnormal 08/04/2017   Tinnitus, right ear 11/13/2014   History of shingles 04/27/2014   Low back pain 04/20/2014   OSA (obstructive sleep apnea) 09/27/2012   Snoring 06/21/2012   Fatigue 06/20/2012   Colon cancer screening 11/04/2011   Routine general medical examination at a health care facility 10/22/2011   Vitamin D  deficiency 07/02/2010   Palpitations 04/07/2010   HYPOKALEMIA 07/10/2009   Allergic rhinitis 04/03/2008   Essential hypertension  01/25/2008   G E R D 07/05/2007   H/O peptic ulcer 07/05/2007   Headache, chronic daily 07/05/2007   NEPHROLITHIASIS, HX OF 07/05/2007   Past Medical History:  Diagnosis Date   Back pain    Chronic back pain    GERD (gastroesophageal reflux disease)    Headache(784.0)    History of COVID-19    History of kidney stones    History of nephrolithiasis    Hypertension    Plantar fasciitis    PSVT (paroxysmal supraventricular tachycardia) (HCC)    Pulmonary nodules    Sleep apnea    Ulcer 1980's   peptic ulcer disease   Past Surgical History:  Procedure Laterality Date   ABDOMINAL HYSTERECTOMY  2002   CHOLECYSTECTOMY  1984   COLONOSCOPY  12/18/2011   Normal   COLONOSCOPY WITH PROPOFOL  N/A 12/23/2021   Procedure: COLONOSCOPY WITH PROPOFOL ;  Surgeon: Marnee Sink, MD;  Location: [facility];  Service: Endoscopy;  Laterality: N/A;   IR ANGIO EXTERNAL CAROTID SEL EXT CAROTID BILAT MOD SED  11/15/2018   IR ANGIO INTRA EXTRACRAN SEL INTERNAL CAROTID BILAT MOD SED  11/15/2018   IR ANGIO VERTEBRAL SEL VERTEBRAL UNI R MOD SED  11/15/2018   IR US  GUIDE VASC ACCESS RIGHT  11/15/2018   LIPOMA EXCISION Left 05/20/2021   Procedure: EXCISION LIPOMA OF LEFT BUTTOCK;  Surgeon: Jacolyn Matar, MD;  Location: [facility];  Service: General;  Laterality: Left;   OOPHORECTOMY     Social History   Tobacco Use   Smoking status: Never   Smokeless tobacco: Never  Vaping Use   Vaping status: Never Used  Substance Use Topics   Alcohol use: Yes    Comment: occas.   Drug use: No   Family History  Problem Relation Age of Onset   Stroke Father    Hypertension Father    Diabetes Father    Cancer Brother        stomach tumor   Allergies  Allergen Reactions   Atenolol Cough   Hydrocodone  Nausea Only   Current Outpatient Medications on File Prior to Visit  Medication Sig Dispense Refill   amLODipine  (NORVASC ) 5 MG tablet Take 1 tablet (5 mg total) by mouth daily. 90 tablet 3   Cholecalciferol  (VITAMIN D3) 2000 UNITS capsule Take 4,000 Units by mouth daily.     gabapentin  (NEURONTIN ) 300 MG capsule Take 300 mg by mouth 3 (three) times daily as needed (pain).     metoprolol  tartrate (LOPRESSOR ) 25 MG tablet Take 1 tablet (25 mg total) by mouth 2 (two) times daily. 180 tablet 1   omeprazole  (PRILOSEC) 20 MG capsule TAKE 1 CAPSULE BY MOUTH DAILY AS NEEDED. 90 capsule 1   Ruxolitinib Phosphate (OPZELURA) 1.5 % CREA Apply 1 Application topically daily as needed.     tiZANidine  (ZANAFLEX ) 4  MG tablet TAKE 1 TABLET (4 MG TOTAL) BY MOUTH EVERY 8 (EIGHT) HOURS AS NEEDED FOR MUSCLE SPASMS 30 tablet 0   No current facility-administered medications on file prior to visit.    Review of Systems  Constitutional:  Negative for activity change, appetite change, fatigue, fever and unexpected weight change.  HENT:  Negative for congestion, ear pain, rhinorrhea, sinus pressure and sore throat.   Eyes:  Negative for pain, redness and visual disturbance.  Respiratory:  Negative for cough, shortness of breath and wheezing.   Cardiovascular:  Negative for chest pain and palpitations.  Gastrointestinal:  Negative for abdominal pain, blood in stool, constipation and diarrhea.  Endocrine: Negative for polydipsia and polyuria.  Genitourinary:  Negative for dysuria, frequency and urgency.  Musculoskeletal:  Positive for arthralgias. Negative for back pain and myalgias.  Skin:  Negative for pallor and rash.  Allergic/Immunologic: Negative for environmental allergies.  Neurological:  Negative for dizziness, syncope and headaches.  Hematological:  Negative for adenopathy. Does not bruise/bleed easily.  Psychiatric/Behavioral:  Negative for decreased concentration and dysphoric mood. The patient is not nervous/anxious.        Objective:   Physical Exam Constitutional:      General: She is not in acute distress.    Appearance: Normal appearance. She is obese. She is not ill-appearing or diaphoretic.  HENT:      Mouth/Throat:     Mouth: Mucous membranes are moist.  Eyes:     Conjunctiva/sclera: Conjunctivae normal.     Pupils: Pupils are equal, round, and reactive to light.  Cardiovascular:     Rate and Rhythm: Normal rate and regular rhythm.  Musculoskeletal:     Comments: Right thumb- Tender over CMC joint , less so pip  No triggering No swelling or skin change   Left knee  Knee  No swelling or effusion  No warmth to the touch  No crepitus  ROM:  Flex pain with full Ext -normal  [place]-mild discomfort  Bounce test nl  Stability: Anterior drawe nl Lachman exam nl  Tenderness over lateral joint line   Gait favors right  Left foot  Tender over 2,3rd MT dorsally  No swelling redness or obv effusion  Some pain with plantar flexion of toes  Little to no bunion deformity     Neurological:     Sensory: No sensory deficit.     Motor: No weakness.  Psychiatric:        Mood and Affect: Mood normal.           Assessment & Plan:   Problem List Items Addressed This Visit       Other   Thumb pain, right   Right thumb Close to [facility] joint  No swelling Pain to adduct and resist extension   Xr ordered : noted mild diffuse interphalangeal joint space narrowing w/o erosions  Penidng labs       Relevant Orders   DG Hand Complete Right (Completed)   Multiple joint pain   Right hand , left knee and foot  Had swelling one day  Intermittent other joints bother her   Lab today for auto immune disorder   Overall reassuring exam Affected joints also xrayed      Relevant Orders   CBC with Differential/Platelet (Completed)   Rheumatoid factor   ANA w/Reflex   Sedimentation Rate (Completed)   Left knee pain   3 weeks  Worse since hurting her foot (may be due to gait change) Lateral pain and  lateral jt line tenderness  Limited flexion  Can bear weigt   Xray ordered: some mild deg change /mostly medial Encouraged use of ice Celebrex  has not hleped much so  far  'avoid painful activities       Relevant Orders   DG Knee 4 Views W/Patella Left (Completed)   Left foot pain - Primary   Dropped phone on top of foot several weeks ago  Ttp over 2,3rd metatarsals  No swelling or bruising Some pain with plantar flexion   Xray ordered : no fractures , some first MTP deg change   Celebrex  not helping  Labs pending       Relevant Orders   DG Foot Complete Left (Completed)

## 2024-04-10 NOTE — Assessment & Plan Note (Addendum)
 Right thumb Close to [facility] joint  No swelling Pain to adduct and resist extension   Xr ordered : noted mild diffuse interphalangeal joint space narrowing w/o erosions  Penidng labs

## 2024-04-10 NOTE — Assessment & Plan Note (Signed)
 Right hand , left knee and foot  Had swelling one day  Intermittent other joints bother her   Lab today for auto immune disorder   Overall reassuring exam Affected joints also xrayed

## 2024-04-10 NOTE — Assessment & Plan Note (Addendum)
 Dropped phone on top of foot several weeks ago  Ttp over 2,3rd metatarsals  No swelling or bruising Some pain with plantar flexion   Xray ordered : no fractures , some first MTP deg change   Celebrex  not helping  Labs pending

## 2024-04-10 NOTE — Patient Instructions (Signed)
 Xrays today We will reach out when they are read   Labs today for auto immune joint disease   Use cold compress on affected areas  Avoid activities that worsen pain when you can   Plan to follow

## 2024-04-10 NOTE — Assessment & Plan Note (Addendum)
 3 weeks  Worse since hurting her foot (may be due to gait change) Lateral pain and lateral jt line tenderness  Limited flexion  Can bear weigt   Xray ordered: some mild deg change /mostly medial Encouraged use of ice Celebrex  has not hleped much so far  'avoid painful activities

## 2024-04-11 DIAGNOSIS — M179 Osteoarthritis of knee, unspecified: Secondary | ICD-10-CM | POA: Insufficient documentation

## 2024-04-11 LAB — RHEUMATOID FACTOR: Rheumatoid fact SerPl-aCnc: <10 [IU]/mL (ref <14)

## 2024-04-11 NOTE — Addendum Note (Signed)
 Addended by: Deri Fleet A on: 04/11/2024 08:34 PM   Modules accepted: Orders

## 2024-04-12 LAB — ANA W/REFLEX: Anti Nuclear Antibody (ANA): NEGATIVE

## 2024-04-19 DIAGNOSIS — M6281 Muscle weakness (generalized): Secondary | ICD-10-CM | POA: Diagnosis not present

## 2024-04-19 DIAGNOSIS — M25562 Pain in left knee: Secondary | ICD-10-CM | POA: Diagnosis not present

## 2024-04-19 DIAGNOSIS — M25662 Stiffness of left knee, not elsewhere classified: Secondary | ICD-10-CM | POA: Diagnosis not present

## 2024-04-21 DIAGNOSIS — M25562 Pain in left knee: Secondary | ICD-10-CM | POA: Diagnosis not present

## 2024-04-21 DIAGNOSIS — M6281 Muscle weakness (generalized): Secondary | ICD-10-CM | POA: Diagnosis not present

## 2024-04-21 DIAGNOSIS — M25662 Stiffness of left knee, not elsewhere classified: Secondary | ICD-10-CM | POA: Diagnosis not present

## 2024-04-26 DIAGNOSIS — M6281 Muscle weakness (generalized): Secondary | ICD-10-CM | POA: Diagnosis not present

## 2024-04-26 DIAGNOSIS — M25662 Stiffness of left knee, not elsewhere classified: Secondary | ICD-10-CM | POA: Diagnosis not present

## 2024-04-26 DIAGNOSIS — M25562 Pain in left knee: Secondary | ICD-10-CM | POA: Diagnosis not present

## 2024-04-27 ENCOUNTER — Other Ambulatory Visit: Payer: Self-pay | Admitting: Family Medicine

## 2024-04-27 DIAGNOSIS — M1811 Unilateral primary osteoarthritis of first carpometacarpal joint, right hand: Secondary | ICD-10-CM | POA: Diagnosis not present

## 2024-04-27 NOTE — Telephone Encounter (Addendum)
 Tizanidine  Last filled on 01/22/24 #30 tabs/ 0 refill  Gabapentin  is on med list as a historical entry  Last OV was joint pain on 04/10/24

## 2024-04-27 NOTE — Telephone Encounter (Signed)
 Please clarify the gabapentin  - how often/ for what, and who was last prescriber?  Thanks  Then I will do the refills

## 2024-04-28 DIAGNOSIS — M25562 Pain in left knee: Secondary | ICD-10-CM | POA: Diagnosis not present

## 2024-04-28 DIAGNOSIS — M25662 Stiffness of left knee, not elsewhere classified: Secondary | ICD-10-CM | POA: Diagnosis not present

## 2024-04-28 DIAGNOSIS — M6281 Muscle weakness (generalized): Secondary | ICD-10-CM | POA: Diagnosis not present

## 2024-04-28 MED ORDER — GABAPENTIN 300 MG PO CAPS: 300.0000 mg | ORAL_CAPSULE | Freq: Every day | ORAL | 0 refills | Status: DC

## 2024-04-28 NOTE — Telephone Encounter (Signed)
 Use with caution  They can both be sedating Refilled

## 2024-04-28 NOTE — Telephone Encounter (Signed)
 Pt said her back doctor prescribed the gabapentin  a long time ago and it helped her back pain a lot. Pt said she is still having left knee/leg pain from when PCP eval sxs earlier in the month. Pt said she only takes gabapentin  once daily as needed but she ran out of med and is asking if PCP can refill it to help with knee pain she is still dealing with   [facility]

## 2024-05-03 DIAGNOSIS — M25662 Stiffness of left knee, not elsewhere classified: Secondary | ICD-10-CM | POA: Diagnosis not present

## 2024-05-03 DIAGNOSIS — M25562 Pain in left knee: Secondary | ICD-10-CM | POA: Diagnosis not present

## 2024-05-03 DIAGNOSIS — M6281 Muscle weakness (generalized): Secondary | ICD-10-CM | POA: Diagnosis not present

## 2024-05-05 DIAGNOSIS — M25562 Pain in left knee: Secondary | ICD-10-CM | POA: Diagnosis not present

## 2024-05-05 DIAGNOSIS — M6281 Muscle weakness (generalized): Secondary | ICD-10-CM | POA: Diagnosis not present

## 2024-05-05 DIAGNOSIS — M25662 Stiffness of left knee, not elsewhere classified: Secondary | ICD-10-CM | POA: Diagnosis not present

## 2024-05-09 DIAGNOSIS — M25562 Pain in left knee: Secondary | ICD-10-CM | POA: Diagnosis not present

## 2024-05-09 DIAGNOSIS — M6281 Muscle weakness (generalized): Secondary | ICD-10-CM | POA: Diagnosis not present

## 2024-05-09 DIAGNOSIS — M25662 Stiffness of left knee, not elsewhere classified: Secondary | ICD-10-CM | POA: Diagnosis not present

## 2024-05-12 DIAGNOSIS — M6281 Muscle weakness (generalized): Secondary | ICD-10-CM | POA: Diagnosis not present

## 2024-05-12 DIAGNOSIS — M25562 Pain in left knee: Secondary | ICD-10-CM | POA: Diagnosis not present

## 2024-05-12 DIAGNOSIS — M25662 Stiffness of left knee, not elsewhere classified: Secondary | ICD-10-CM | POA: Diagnosis not present

## 2024-05-17 DIAGNOSIS — M25562 Pain in left knee: Secondary | ICD-10-CM | POA: Diagnosis not present

## 2024-05-17 DIAGNOSIS — M6281 Muscle weakness (generalized): Secondary | ICD-10-CM | POA: Diagnosis not present

## 2024-05-17 DIAGNOSIS — M25662 Stiffness of left knee, not elsewhere classified: Secondary | ICD-10-CM | POA: Diagnosis not present

## 2024-05-19 DIAGNOSIS — M25662 Stiffness of left knee, not elsewhere classified: Secondary | ICD-10-CM | POA: Diagnosis not present

## 2024-05-19 DIAGNOSIS — M6281 Muscle weakness (generalized): Secondary | ICD-10-CM | POA: Diagnosis not present

## 2024-05-19 DIAGNOSIS — M25562 Pain in left knee: Secondary | ICD-10-CM | POA: Diagnosis not present

## 2024-05-24 DIAGNOSIS — M6281 Muscle weakness (generalized): Secondary | ICD-10-CM | POA: Diagnosis not present

## 2024-05-24 DIAGNOSIS — M25662 Stiffness of left knee, not elsewhere classified: Secondary | ICD-10-CM | POA: Diagnosis not present

## 2024-05-24 DIAGNOSIS — M25562 Pain in left knee: Secondary | ICD-10-CM | POA: Diagnosis not present

## 2024-05-26 DIAGNOSIS — M25562 Pain in left knee: Secondary | ICD-10-CM | POA: Diagnosis not present

## 2024-05-26 DIAGNOSIS — M25662 Stiffness of left knee, not elsewhere classified: Secondary | ICD-10-CM | POA: Diagnosis not present

## 2024-05-26 DIAGNOSIS — M6281 Muscle weakness (generalized): Secondary | ICD-10-CM | POA: Diagnosis not present

## 2024-05-31 DIAGNOSIS — M6281 Muscle weakness (generalized): Secondary | ICD-10-CM | POA: Diagnosis not present

## 2024-05-31 DIAGNOSIS — M25562 Pain in left knee: Secondary | ICD-10-CM | POA: Diagnosis not present

## 2024-05-31 DIAGNOSIS — M25662 Stiffness of left knee, not elsewhere classified: Secondary | ICD-10-CM | POA: Diagnosis not present

## 2024-06-02 DIAGNOSIS — M25662 Stiffness of left knee, not elsewhere classified: Secondary | ICD-10-CM | POA: Diagnosis not present

## 2024-06-02 DIAGNOSIS — M25562 Pain in left knee: Secondary | ICD-10-CM | POA: Diagnosis not present

## 2024-06-02 DIAGNOSIS — M6281 Muscle weakness (generalized): Secondary | ICD-10-CM | POA: Diagnosis not present

## 2024-06-07 DIAGNOSIS — M25562 Pain in left knee: Secondary | ICD-10-CM | POA: Diagnosis not present

## 2024-06-07 DIAGNOSIS — M6281 Muscle weakness (generalized): Secondary | ICD-10-CM | POA: Diagnosis not present

## 2024-06-07 DIAGNOSIS — M25662 Stiffness of left knee, not elsewhere classified: Secondary | ICD-10-CM | POA: Diagnosis not present

## 2024-06-09 DIAGNOSIS — M6281 Muscle weakness (generalized): Secondary | ICD-10-CM | POA: Diagnosis not present

## 2024-06-09 DIAGNOSIS — M25562 Pain in left knee: Secondary | ICD-10-CM | POA: Diagnosis not present

## 2024-06-09 DIAGNOSIS — M25662 Stiffness of left knee, not elsewhere classified: Secondary | ICD-10-CM | POA: Diagnosis not present

## 2024-06-14 DIAGNOSIS — M6281 Muscle weakness (generalized): Secondary | ICD-10-CM | POA: Diagnosis not present

## 2024-06-14 DIAGNOSIS — M25662 Stiffness of left knee, not elsewhere classified: Secondary | ICD-10-CM | POA: Diagnosis not present

## 2024-06-14 DIAGNOSIS — M25562 Pain in left knee: Secondary | ICD-10-CM | POA: Diagnosis not present

## 2024-06-16 DIAGNOSIS — M25562 Pain in left knee: Secondary | ICD-10-CM | POA: Diagnosis not present

## 2024-06-16 DIAGNOSIS — M25662 Stiffness of left knee, not elsewhere classified: Secondary | ICD-10-CM | POA: Diagnosis not present

## 2024-06-16 DIAGNOSIS — M6281 Muscle weakness (generalized): Secondary | ICD-10-CM | POA: Diagnosis not present

## 2024-06-21 DIAGNOSIS — M6281 Muscle weakness (generalized): Secondary | ICD-10-CM | POA: Diagnosis not present

## 2024-06-21 DIAGNOSIS — M25562 Pain in left knee: Secondary | ICD-10-CM | POA: Diagnosis not present

## 2024-06-21 DIAGNOSIS — M25662 Stiffness of left knee, not elsewhere classified: Secondary | ICD-10-CM | POA: Diagnosis not present

## 2024-06-22 ENCOUNTER — Telehealth: Payer: Self-pay | Admitting: *Deleted

## 2024-06-22 ENCOUNTER — Other Ambulatory Visit: Payer: Self-pay | Admitting: Family Medicine

## 2024-06-22 DIAGNOSIS — Z1231 Encounter for screening mammogram for malignant neoplasm of breast: Secondary | ICD-10-CM

## 2024-06-22 DIAGNOSIS — M25562 Pain in left knee: Secondary | ICD-10-CM

## 2024-06-22 NOTE — Telephone Encounter (Signed)
 Let's get you to orthopedics  Is [facility] ok?  I will do a referral  Thanks for letting me know

## 2024-06-22 NOTE — Telephone Encounter (Signed)
 Copied from CRM [id number]. Topic: Clinical - Medical Advice >> Jun 22, 2024  2:44 PM Chasity T wrote: Reason for CRM: patient is calling in to see if pcp Dr Randeen will allow her to either see an orthopedic doctor or get an MRI to see why she is still not able to straighten her left leg out. She states that PT has told her that her strength is back but they wont be able to fully help her with getting her leg back straight as needed because its more going on than just doing PT. Please contact patient back for further discussion on decision.

## 2024-06-23 NOTE — Telephone Encounter (Signed)
 The referral is in.

## 2024-06-23 NOTE — Telephone Encounter (Signed)
 Pt does want to proceed with [facility]), pt advise that she should receive some type of communication (call or mychart) within 2 weeks and if not to let us  know.

## 2024-06-27 DIAGNOSIS — M25562 Pain in left knee: Secondary | ICD-10-CM | POA: Diagnosis not present

## 2024-06-27 DIAGNOSIS — M25662 Stiffness of left knee, not elsewhere classified: Secondary | ICD-10-CM | POA: Diagnosis not present

## 2024-06-27 DIAGNOSIS — M6281 Muscle weakness (generalized): Secondary | ICD-10-CM | POA: Diagnosis not present

## 2024-06-29 DIAGNOSIS — M2242 Chondromalacia patellae, left knee: Secondary | ICD-10-CM | POA: Diagnosis not present

## 2024-06-29 DIAGNOSIS — M1712 Unilateral primary osteoarthritis, left knee: Secondary | ICD-10-CM | POA: Diagnosis not present

## 2024-06-29 DIAGNOSIS — M222X2 Patellofemoral disorders, left knee: Secondary | ICD-10-CM | POA: Diagnosis not present

## 2024-06-30 DIAGNOSIS — M25562 Pain in left knee: Secondary | ICD-10-CM | POA: Diagnosis not present

## 2024-06-30 DIAGNOSIS — M25662 Stiffness of left knee, not elsewhere classified: Secondary | ICD-10-CM | POA: Diagnosis not present

## 2024-06-30 DIAGNOSIS — M6281 Muscle weakness (generalized): Secondary | ICD-10-CM | POA: Diagnosis not present

## 2024-07-03 DIAGNOSIS — M6281 Muscle weakness (generalized): Secondary | ICD-10-CM | POA: Diagnosis not present

## 2024-07-03 DIAGNOSIS — M25662 Stiffness of left knee, not elsewhere classified: Secondary | ICD-10-CM | POA: Diagnosis not present

## 2024-07-03 DIAGNOSIS — M25562 Pain in left knee: Secondary | ICD-10-CM | POA: Diagnosis not present

## 2024-07-07 DIAGNOSIS — M25662 Stiffness of left knee, not elsewhere classified: Secondary | ICD-10-CM | POA: Diagnosis not present

## 2024-07-07 DIAGNOSIS — M6281 Muscle weakness (generalized): Secondary | ICD-10-CM | POA: Diagnosis not present

## 2024-07-07 DIAGNOSIS — M25562 Pain in left knee: Secondary | ICD-10-CM | POA: Diagnosis not present

## 2024-07-10 DIAGNOSIS — H93A1 Pulsatile tinnitus, right ear: Secondary | ICD-10-CM | POA: Diagnosis not present

## 2024-07-12 DIAGNOSIS — M25562 Pain in left knee: Secondary | ICD-10-CM | POA: Diagnosis not present

## 2024-07-12 DIAGNOSIS — M25662 Stiffness of left knee, not elsewhere classified: Secondary | ICD-10-CM | POA: Diagnosis not present

## 2024-07-12 DIAGNOSIS — M6281 Muscle weakness (generalized): Secondary | ICD-10-CM | POA: Diagnosis not present

## 2024-07-14 DIAGNOSIS — M6281 Muscle weakness (generalized): Secondary | ICD-10-CM | POA: Diagnosis not present

## 2024-07-14 DIAGNOSIS — M25662 Stiffness of left knee, not elsewhere classified: Secondary | ICD-10-CM | POA: Diagnosis not present

## 2024-07-14 DIAGNOSIS — M25562 Pain in left knee: Secondary | ICD-10-CM | POA: Diagnosis not present

## 2024-07-26 ENCOUNTER — Ambulatory Visit

## 2024-07-29 ENCOUNTER — Other Ambulatory Visit: Payer: Self-pay | Admitting: Family Medicine

## 2024-07-31 NOTE — Telephone Encounter (Signed)
 Last OV was an acute appt for pain on 04/11/23 (no future appts)  Last filled on 04/28/24 #90 caps/ 0 refills

## 2024-08-09 ENCOUNTER — Ambulatory Visit
Admission: RE | Admit: 2024-08-09 | Discharge: 2024-08-09 | Disposition: A | Discharge status: Home / Self Care | Source: Ambulatory Visit | Attending: Family Medicine | Admitting: Family Medicine

## 2024-08-09 ENCOUNTER — Encounter: Payer: Self-pay | Admitting: *Deleted

## 2024-08-09 DIAGNOSIS — Z1231 Encounter for screening mammogram for malignant neoplasm of breast: Secondary | ICD-10-CM | POA: Diagnosis not present

## 2024-08-14 ENCOUNTER — Ambulatory Visit: Payer: Self-pay | Admitting: Family Medicine

## 2024-08-18 ENCOUNTER — Ambulatory Visit: Attending: Internal Medicine | Admitting: Internal Medicine

## 2024-08-18 ENCOUNTER — Encounter: Payer: Self-pay | Admitting: Internal Medicine

## 2024-08-18 VITALS — BP 110/78 | HR 72 | Ht 63.0 in | Wt 172.5 lb

## 2024-08-18 DIAGNOSIS — I471 Supraventricular tachycardia, unspecified: Secondary | ICD-10-CM | POA: Diagnosis not present

## 2024-08-18 DIAGNOSIS — I700 Atherosclerosis of aorta: Secondary | ICD-10-CM | POA: Diagnosis not present

## 2024-08-18 DIAGNOSIS — H93A9 Pulsatile tinnitus, unspecified ear: Secondary | ICD-10-CM

## 2024-08-18 DIAGNOSIS — I10 Essential (primary) hypertension: Secondary | ICD-10-CM

## 2024-08-18 DIAGNOSIS — E7800 Pure hypercholesterolemia, unspecified: Secondary | ICD-10-CM | POA: Diagnosis not present

## 2024-08-18 DIAGNOSIS — R002 Palpitations: Secondary | ICD-10-CM

## 2024-08-18 NOTE — Progress Notes (Unsigned)
  Cardiology Office Note:  .   Date:  08/20/2024  ID:  Mindy Long, DOB 1961-07-06, MRN [id number] PCP: Randeen Laine LABOR, MD  [facility] Providers Cardiologist:  Lonni Hanson, MD     History of Present Illness: .   Mindy Long is a 63 y.o. female with history of chest pain with normal coronary CTA in 10/2021, PSVT, aortic atherosclerosis, hypertension, hyperlipidemia, GERD, sleep apnea, and kidney stones, who presents for follow-up of chest pain and palpitations.  I last saw her in 01/2024, at which time she was feeling fairly well though she continued to report sporadic palpitations.  She denied further chest pain.  She was in the process of undergoing ENT evaluation of pulsatile tinnitus.  We agreed to increase metoprolol  tartrate to 25 mg twice daily to see if this would help lessen her palpitations.  Today, Mindy Long reports that she has been feeling fairly well with reduction in palpitations since escalation of metoprolol  at our last visit.  She still has palpitations for 1 or 2 seconds about twice a week.  There are no associated symptoms.  She denies chest pain, shortness of breath, and lightheadedness.  She continues to have pulsatile tinnitus and has been referred to neurology by her otolaryngologist for further evaluation.  She notes that her tinnitus seems to worsen when her blood pressure is elevated. ROS: See HPI  Studies Reviewed: SABRA   EKG Interpretation Date/Time:  Friday August 18 2024 14:15:46 EDT Ventricular Rate:  72 PR Interval:  128 QRS Duration:  70 QT Interval:  370 QTC Calculation: 405 R Axis:   6  Text Interpretation: Normal sinus rhythm Nonspecific T wave abnormality Abnormal ECG When compared with ECG of 28-Jan-2024 08:58, No significant change was found Confirmed by Carrington Olazabal, Lonni [phone]) on 08/20/2024 11:12:00 AM    Risk Assessment/Calculations:           Physical Exam:   VS:  BP 110/78 (BP Location: Left Arm, Patient Position:  Sitting, Cuff Size: Large)   Pulse 72   Ht 5' 3 (1.6 m)   Wt 172 lb 8 oz (78.2 kg)   SpO2 98%   BMI 30.56 kg/m    Wt Readings from Last 3 Encounters:  08/18/24 172 lb 8 oz (78.2 kg)  04/10/24 171 lb 2 oz (77.6 kg)  01/28/24 171 lb 12.8 oz (77.9 kg)    General:  NAD. Neck: No JVD or HJR. Lungs: Clear to auscultation bilaterally without wheezes or crackles. Heart: Regular rate and rhythm without murmurs, rubs, or gallops. Abdomen: Soft, nontender, nondistended. Extremities: No lower extremity edema.  ASSESSMENT AND PLAN: .    Palpitations and PSVT: Frequency of palpitations has improved with escalation of metoprolol .  No worrisome accompanying symptoms reported.  Prior workup reassuring with event monitor notable for rare PACs and PVCs as well as 2 brief supraventricular runs.  Continue metoprolol  to tartrate 25 mg twice daily.  No further workup recommended at this time.  Pulsatile tinnitus: Patient has been referred to neurology by her otolaryngologist.  Hypertension: Blood pressure well-controlled today.  No medication changes at this time.  Aortic atherosclerosis and hyperlipidemia: Incidentally noted on cross-sectional imaging of the aorta.  Mindy Long is scheduled for follow-up labs in the next month with Dr. Randeen.  If LDL remains above 120 despite lifestyle modifications, addition of statin therapy will need to be considered.    Dispo: Return to clinic in 1 year.  Signed, Lonni Hanson, MD

## 2024-08-18 NOTE — Patient Instructions (Signed)
 Medication Instructions:  Your physician recommends that you continue on your current medications as directed. Please refer to the Current Medication list given to you today.  *If you need a refill on your cardiac medications before your next appointment, please call your pharmacy*  Lab Work: No labs ordered today  If you have labs (blood work) drawn today and your tests are completely normal, you will receive your results only by: MyChart Message (if you have MyChart) OR A paper copy in the mail If you have any lab test that is abnormal or we need to change your treatment, we will call you to review the results.  Testing/Procedures: No test ordered today   Follow-Up: At [facility], you and your health needs are our priority.  As part of our continuing mission to provide you with exceptional heart care, our providers are all part of one team.  This team includes your primary Cardiologist (physician) and Advanced Practice Providers or APPs (Physician Assistants and Nurse Practitioners) who all work together to provide you with the care you need, when you need it.  Your next appointment:   1 year(s)  Provider:   You may see Sammy Crisp, MD or one of the following Advanced Practice Providers on your designated Care Team:   Laneta Pintos, NP Gildardo Labrador, PA-C Varney Gentleman, PA-C Cadence [place], PA-C Ronald Cockayne, NP Morey Ar, NP    We recommend signing up for the patient portal called "MyChart".  Sign up information is provided on this After Visit Summary.  MyChart is used to connect with patients for Virtual Visits (Telemedicine).  Patients are able to view lab/test results, encounter notes, upcoming appointments, etc.  Non-urgent messages can be sent to your provider as well.   To learn more about what you can do with MyChart, go to [email or web address].

## 2024-08-20 ENCOUNTER — Encounter: Payer: Self-pay | Admitting: Internal Medicine

## 2024-09-06 ENCOUNTER — Ambulatory Visit: Payer: Self-pay

## 2024-09-06 NOTE — Telephone Encounter (Signed)
 FYI Only or Action Required?: FYI only for provider.  Patient was last seen in primary care on 04/10/2024 by Randeen Laine LABOR, MD.  Called Nurse Triage reporting Pruritis.  Symptoms began several days ago.  Interventions attempted: OTC medications: Benadryl and calamine lotion.  Symptoms are: gradually worsening.  Triage Disposition: See Physician Within 24 Hours  Patient/caregiver understands and will follow disposition?: Yes     Copied from CRM #[id number]. Topic: Clinical - Red Word Triage >> Sep 06, 2024 12:09 PM Timindy P wrote: Red Word that prompted transfer to Nurse Triage: Hives, itchiness, spreading all over body. Reason for Disposition  [1] MODERATE-SEVERE widespread itching (i.e., interferes with sleep, normal activities or school) AND [2] not improved after 24 hours of itching Care Advice  Answer Assessment - Initial Assessment Questions Patient states she was out in the garden recently before symptoms started. Patient using Benadryl and calamine for symptoms. Patient denies fever, dizziness or shortness of breath.    1. DESCRIPTION: Describe the itching you are having.     Itching due to bumps on skin  2. SEVERITY: How bad is it?      Moderate  3. SCRATCHING: Are there any scratch marks? Bleeding?     Denies  4. ONSET: When did this begin? (e.g., minutes, hours, days ago)      6 days ago  5. CAUSE: What do you think is causing the itching? (ask about swimming pools, pollen, animals, soaps, etc.)     A bush in her garden  6. OTHER SYMPTOMS: Do you have any other symptoms? (e.g., fever, rash)     Bumps on skin  Protocols used: Itching - [facility]

## 2024-09-06 NOTE — Telephone Encounter (Signed)
 Will see patient then Agree with ER and UC precautions

## 2024-09-07 ENCOUNTER — Encounter: Payer: Self-pay | Admitting: Family Medicine

## 2024-09-07 ENCOUNTER — Ambulatory Visit: Admitting: Family Medicine

## 2024-09-07 VITALS — BP 134/82 | HR 71 | Temp 98.4°F | Ht 63.0 in | Wt 172.2 lb

## 2024-09-07 DIAGNOSIS — R21 Rash and other nonspecific skin eruption: Secondary | ICD-10-CM

## 2024-09-07 MED ORDER — PREDNISONE 10 MG PO TABS: ORAL_TABLET | ORAL | 0 refills | Status: DC

## 2024-09-07 NOTE — Patient Instructions (Addendum)
 I think you reacted to something   Avoid products with fragrance for now  Stay cool  Trim nails if you are open to that   Use a scent free moisturizer   Get zyrtec 10 mg and take 1 pill daily for itch  You can still use benadryl if needed   Take the prednisone  as directed  It may make you feel hyper and hungry   Update if not starting to improve in a week or if worsening    Get a flu shot 2 or more weeks after your flu shot

## 2024-09-07 NOTE — Assessment & Plan Note (Signed)
 More than likely contact derm New exposures-okra in garden and also acetone  1 week/ improving but not resolved  Reassuring exam Disc symptomatic care - see instructions on AVS  Prednisone  40 mg taper (discussed side effects)  Zyrtec daily  Benadryl prn   Encouraged to  Stay cool  Avoid scented products Stop using fabric softener    Update if not starting to improve in a week or if worsening  Call back and Er precautions noted in detail today

## 2024-09-07 NOTE — Progress Notes (Signed)
 Subjective:    Patient ID: Mindy Long, female    DOB: December 28, 1960, 63 y.o.   MRN: [id number]  HPI  Wt Readings from Last 3 Encounters:  09/07/24 172 lb 4 oz (78.1 kg)  08/18/24 172 lb 8 oz (78.2 kg)  04/10/24 171 lb 2 oz (77.6 kg)   30.51 kg/m  Vitals:   09/07/24 0934  BP: 134/82  Pulse: 71  Temp: 98.4 F (36.9 C)  SpO2: 98%   Pt presents with c/o  Itching all over   Started a week ago  Had been in the garden (but she wears long sleeves)  Was working in [employer]   Had hives when it first started- on both sides of back lower  And little bumps in groin area  Still getting some random bumps   External itching- everywhere incl scalp  Warm areas are worse  No internal itching  No swelling of lips/eyes or mouth  No shortness of breath No wheezing    New exposures  Used acetone to remove some acrylics Worked in garden  Itching started next day   Over the counter Tried calamine  Benadryl -helps a little    Lab Results  Component Value Date   NA 141 09/21/2023   K 3.6 09/21/2023   CO2 27 09/21/2023   GLUCOSE 93 09/21/2023   BUN 12 09/21/2023   CREATININE 0.88 09/21/2023   CALCIUM 9.2 09/21/2023   GFR 69.49 09/21/2023   GFRNONAA >60 10/22/2021   Lab Results  Component Value Date   ALT 11 09/21/2023   AST 15 09/21/2023   ALKPHOS 91 09/21/2023   BILITOT 0.7 09/21/2023   Lab Results  Component Value Date   WBC 4.1 04/10/2024   HGB 13.5 04/10/2024   HCT 42.3 04/10/2024   MCV 78.3 04/10/2024   PLT 245.0 04/10/2024   Lab Results  Component Value Date   TSH 1.92 09/21/2023     Patient Active Problem List   Diagnosis Date Noted   Osteoarthritis, knee 04/11/2024   Thumb pain, right 04/10/2024   Left knee pain 04/10/2024   Left foot pain 04/10/2024   Multiple joint pain 04/10/2024   Pulsatile tinnitus 01/28/2024   Prediabetes 09/28/2023   Current use of proton pump inhibitor 09/20/2023   Aortic atherosclerosis 10/07/2022   Pure  hypercholesterolemia 10/07/2022   Skin hypopigmentation 09/24/2022   Polyp of ascending colon    PSVT (paroxysmal supraventricular tachycardia) 10/01/2021   Thyroid  nodule 09/23/2021   Rash 09/23/2021   Abnormal serum ACE level 04/08/2018   Eye exam abnormal 08/04/2017   Tinnitus, right ear 11/13/2014   History of shingles 04/27/2014   Low back pain 04/20/2014   OSA (obstructive sleep apnea) 09/27/2012   Snoring 06/21/2012   Fatigue 06/20/2012   Colon cancer screening 11/04/2011   Routine general medical examination at a health care facility 10/22/2011   Vitamin D  deficiency 07/02/2010   Palpitations 04/07/2010   HYPOKALEMIA 07/10/2009   Allergic rhinitis 04/03/2008   Essential hypertension 01/25/2008   G E R D 07/05/2007   H/O peptic ulcer 07/05/2007   Headache, chronic daily 07/05/2007   NEPHROLITHIASIS, HX OF 07/05/2007   Past Medical History:  Diagnosis Date   Back pain    Chronic back pain    GERD (gastroesophageal reflux disease)    Headache(784.0)    History of COVID-19    History of kidney stones    History of nephrolithiasis    Hypertension    Plantar fasciitis  PSVT (paroxysmal supraventricular tachycardia)    Pulmonary nodules    Sleep apnea    Ulcer 1980's   peptic ulcer disease   Past Surgical History:  Procedure Laterality Date   ABDOMINAL HYSTERECTOMY  2002   CHOLECYSTECTOMY  1984   COLONOSCOPY  12/18/2011   Normal   COLONOSCOPY WITH PROPOFOL  N/A 12/23/2021   Procedure: COLONOSCOPY WITH PROPOFOL ;  Surgeon: Jinny Carmine, MD;  Location: [facility];  Service: Endoscopy;  Laterality: N/A;   IR ANGIO EXTERNAL CAROTID SEL EXT CAROTID BILAT MOD SED  11/15/2018   IR ANGIO INTRA EXTRACRAN SEL INTERNAL CAROTID BILAT MOD SED  11/15/2018   IR ANGIO VERTEBRAL SEL VERTEBRAL UNI R MOD SED  11/15/2018   IR US  GUIDE VASC ACCESS RIGHT  11/15/2018   LIPOMA EXCISION Left 05/20/2021   Procedure: EXCISION LIPOMA OF LEFT BUTTOCK;  Surgeon: Gladis Cough, MD;   Location: [facility];  Service: General;  Laterality: Left;   OOPHORECTOMY     Social History   Tobacco Use   Smoking status: Never   Smokeless tobacco: Never  Vaping Use   Vaping status: Never Used  Substance Use Topics   Alcohol use: Yes    Comment: occas.   Drug use: No   Family History  Problem Relation Age of Onset   Stroke Father    Hypertension Father    Diabetes Father    Cancer Brother        stomach tumor   Allergies  Allergen Reactions   Atenolol Cough   Hydrocodone  Nausea Only   Current Outpatient Medications on File Prior to Visit  Medication Sig Dispense Refill   amLODipine  (NORVASC ) 5 MG tablet Take 1 tablet (5 mg total) by mouth daily. 90 tablet 3   Cholecalciferol (VITAMIN D3) 2000 UNITS capsule Take 4,000 Units by mouth daily.     gabapentin  (NEURONTIN ) 300 MG capsule TAKE 1 CAPSULE (300 MG TOTAL) BY MOUTH DAILY. CAUTION OF SEDATION 90 capsule 1   meloxicam (MOBIC) 15 MG tablet Take 15 mg by mouth daily.     metoprolol  tartrate (LOPRESSOR ) 25 MG tablet Take 1 tablet (25 mg total) by mouth 2 (two) times daily. 180 tablet 1   omeprazole  (PRILOSEC) 20 MG capsule TAKE 1 CAPSULE BY MOUTH DAILY AS NEEDED. 90 capsule 1   Ruxolitinib Phosphate (OPZELURA) 1.5 % CREA Apply 1 Application topically daily as needed.     tiZANidine  (ZANAFLEX ) 4 MG tablet TAKE 1 TABLET (4 MG TOTAL) BY MOUTH EVERY 8 (EIGHT) HOURS AS NEEDED FOR MUSCLE SPASMS 30 tablet 2   No current facility-administered medications on file prior to visit.    Review of Systems  Constitutional:  Negative for chills, diaphoresis, fatigue and fever.  HENT:  Negative for sore throat and trouble swallowing.   Respiratory:  Negative for cough, shortness of breath, wheezing and stridor.   Skin:  Positive for rash. Negative for color change.  Neurological:  Negative for headaches.       Objective:   Physical Exam Constitutional:      General: She is not in acute distress.    Appearance: Normal appearance.  She is obese. She is not ill-appearing.  HENT:     Head: Normocephalic and atraumatic.     Mouth/Throat:     Mouth: Mucous membranes are moist.     Pharynx: Oropharynx is clear.  Eyes:     General:        Right eye: No discharge.        Left  eye: No discharge.     Conjunctiva/sclera: Conjunctivae normal.     Pupils: Pupils are equal, round, and reactive to light.  Cardiovascular:     Rate and Rhythm: Normal rate and regular rhythm.  Musculoskeletal:     Cervical back: Neck supple.  Lymphadenopathy:     Cervical: No cervical adenopathy.  Skin:    Coloration: Skin is not jaundiced.     Findings: No bruising.     Comments: Patchy pink papular rash on lower abdomen and around low back/ both sides No whelps  Some excoriation  Few excoriated but healing areas in left groin   No rash in web spaces/ fingers or toes Or in axilla   Neurological:     Mental Status: She is alert.     Sensory: No sensory deficit.  Psychiatric:        Mood and Affect: Mood normal.           Assessment & Plan:   Problem List Items Addressed This Visit       Musculoskeletal and Integument   Rash - Primary   More than likely contact derm New exposures-okra in garden and also acetone  1 week/ improving but not resolved  Reassuring exam Disc symptomatic care - see instructions on AVS  Prednisone  40 mg taper (discussed side effects)  Zyrtec daily  Benadryl prn   Encouraged to  Stay cool  Avoid scented products Stop using fabric softener    Update if not starting to improve in a week or if worsening  Call back and Er precautions noted in detail today

## 2024-09-08 DIAGNOSIS — H93A1 Pulsatile tinnitus, right ear: Secondary | ICD-10-CM | POA: Diagnosis not present

## 2024-09-08 DIAGNOSIS — E236 Other disorders of pituitary gland: Secondary | ICD-10-CM | POA: Diagnosis not present

## 2024-09-08 DIAGNOSIS — I871 Compression of vein: Secondary | ICD-10-CM | POA: Diagnosis not present

## 2024-09-24 ENCOUNTER — Telehealth: Payer: Self-pay | Admitting: Family Medicine

## 2024-09-24 DIAGNOSIS — E559 Vitamin D deficiency, unspecified: Secondary | ICD-10-CM

## 2024-09-24 DIAGNOSIS — Z79899 Other long term (current) drug therapy: Secondary | ICD-10-CM

## 2024-09-24 DIAGNOSIS — E7800 Pure hypercholesterolemia, unspecified: Secondary | ICD-10-CM

## 2024-09-24 DIAGNOSIS — I10 Essential (primary) hypertension: Secondary | ICD-10-CM

## 2024-09-24 DIAGNOSIS — R7303 Prediabetes: Secondary | ICD-10-CM

## 2024-09-24 NOTE — Telephone Encounter (Signed)
-----   Message from Harlene Du sent at 09/08/2024  1:58 PM EDT ----- Regarding: Lab Tues 09/26/24 Hello,  Patient is coming in for CPE labs on Tuesday 09/26/24. Can we get orders please.   Thanks

## 2024-09-26 ENCOUNTER — Ambulatory Visit: Payer: Self-pay | Admitting: Family Medicine

## 2024-09-26 ENCOUNTER — Other Ambulatory Visit

## 2024-09-26 DIAGNOSIS — E559 Vitamin D deficiency, unspecified: Secondary | ICD-10-CM

## 2024-09-26 DIAGNOSIS — I10 Essential (primary) hypertension: Secondary | ICD-10-CM

## 2024-09-26 DIAGNOSIS — E7800 Pure hypercholesterolemia, unspecified: Secondary | ICD-10-CM

## 2024-09-26 DIAGNOSIS — Z79899 Other long term (current) drug therapy: Secondary | ICD-10-CM

## 2024-09-26 DIAGNOSIS — R7303 Prediabetes: Secondary | ICD-10-CM | POA: Diagnosis not present

## 2024-09-26 LAB — CBC WITH DIFFERENTIAL/PLATELET
Basophils Absolute: 0 K/uL (ref 0.0–0.1)
Basophils Relative: 0.4 % (ref 0.0–3.0)
Eosinophils Absolute: 0.1 K/uL (ref 0.0–0.7)
Eosinophils Relative: 1.5 % (ref 0.0–5.0)
HCT: 42 % (ref 36.0–46.0)
Hemoglobin: 13.7 g/dL (ref 12.0–15.0)
Lymphocytes Relative: 45.2 % (ref 12.0–46.0)
Lymphs Abs: 3.4 K/uL (ref 0.7–4.0)
MCHC: 32.6 g/dL (ref 30.0–36.0)
MCV: 77.3 fl — ABNORMAL LOW (ref 78.0–100.0)
Monocytes Absolute: 0.4 K/uL (ref 0.1–1.0)
Monocytes Relative: 5.5 % (ref 3.0–12.0)
Neutro Abs: 3.5 K/uL (ref 1.4–7.7)
Neutrophils Relative %: 47.4 % (ref 43.0–77.0)
Platelets: 234 K/uL (ref 150.0–400.0)
RBC: 5.44 Mil/uL — ABNORMAL HIGH (ref 3.87–5.11)
RDW: 14.7 % (ref 11.5–15.5)
WBC: 7.5 K/uL (ref 4.0–10.5)

## 2024-09-26 LAB — COMPREHENSIVE METABOLIC PANEL WITH GFR
ALT: 15 U/L (ref 0–35)
AST: 14 U/L (ref 0–37)
Albumin: 4 g/dL (ref 3.5–5.2)
Alkaline Phosphatase: 74 U/L (ref 39–117)
BUN: 14 mg/dL (ref 6–23)
CO2: 26 meq/L (ref 19–32)
Calcium: 8.9 mg/dL (ref 8.4–10.5)
Chloride: 105 meq/L (ref 96–112)
Creatinine, Ser: 0.85 mg/dL (ref 0.40–1.20)
GFR: 71.93 mL/min
Glucose, Bld: 91 mg/dL (ref 70–99)
Potassium: 3.5 meq/L (ref 3.5–5.1)
Sodium: 141 meq/L (ref 135–145)
Total Bilirubin: 0.6 mg/dL (ref 0.2–1.2)
Total Protein: 6.7 g/dL (ref 6.0–8.3)

## 2024-09-26 LAB — LIPID PANEL
Cholesterol: 228 mg/dL — ABNORMAL HIGH (ref 0–200)
HDL: 86.6 mg/dL (ref >39.00)
LDL Cholesterol: 128 mg/dL — ABNORMAL HIGH (ref 0–99)
NonHDL: 141.47
Total CHOL/HDL Ratio: 3
Triglycerides: 68 mg/dL (ref 0.0–149.0)
VLDL: 13.6 mg/dL (ref 0.0–40.0)

## 2024-09-26 LAB — TSH: TSH: 1.33 u[IU]/mL (ref 0.35–5.50)

## 2024-09-26 LAB — VITAMIN D 25 HYDROXY (VIT D DEFICIENCY, FRACTURES): VITD: 35.08 ng/mL (ref 30.00–100.00)

## 2024-09-26 LAB — VITAMIN B12: Vitamin B-12: 335 pg/mL (ref 211–911)

## 2024-09-26 LAB — HEMOGLOBIN A1C: Hgb A1c MFr Bld: 6.1 % (ref 4.6–6.5)

## 2024-10-03 ENCOUNTER — Ambulatory Visit: Admitting: Family Medicine

## 2024-10-03 ENCOUNTER — Encounter: Payer: Self-pay | Admitting: Family Medicine

## 2024-10-03 VITALS — BP 128/76 | HR 86 | Temp 98.0°F | Ht 62.0 in | Wt 170.4 lb

## 2024-10-03 DIAGNOSIS — I10 Essential (primary) hypertension: Secondary | ICD-10-CM | POA: Diagnosis not present

## 2024-10-03 DIAGNOSIS — Z0000 Encounter for general adult medical examination without abnormal findings: Secondary | ICD-10-CM

## 2024-10-03 DIAGNOSIS — I871 Compression of vein: Secondary | ICD-10-CM

## 2024-10-03 DIAGNOSIS — E559 Vitamin D deficiency, unspecified: Secondary | ICD-10-CM

## 2024-10-03 DIAGNOSIS — E7800 Pure hypercholesterolemia, unspecified: Secondary | ICD-10-CM | POA: Diagnosis not present

## 2024-10-03 DIAGNOSIS — H93A9 Pulsatile tinnitus, unspecified ear: Secondary | ICD-10-CM | POA: Diagnosis not present

## 2024-10-03 DIAGNOSIS — Z23 Encounter for immunization: Secondary | ICD-10-CM

## 2024-10-03 DIAGNOSIS — M179 Osteoarthritis of knee, unspecified: Secondary | ICD-10-CM

## 2024-10-03 DIAGNOSIS — R7303 Prediabetes: Secondary | ICD-10-CM

## 2024-10-03 DIAGNOSIS — R519 Headache, unspecified: Secondary | ICD-10-CM

## 2024-10-03 DIAGNOSIS — Z79899 Other long term (current) drug therapy: Secondary | ICD-10-CM

## 2024-10-03 DIAGNOSIS — E2839 Other primary ovarian failure: Secondary | ICD-10-CM | POA: Insufficient documentation

## 2024-10-03 DIAGNOSIS — Z1211 Encounter for screening for malignant neoplasm of colon: Secondary | ICD-10-CM

## 2024-10-03 NOTE — Assessment & Plan Note (Signed)
 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

## 2024-10-03 NOTE — Assessment & Plan Note (Signed)
 Lab Results  Component Value Date   VITAMINB12 335 09/26/2024   Last vitamin D  Lab Results  Component Value Date   VD25OH 35.08 09/26/2024

## 2024-10-03 NOTE — Assessment & Plan Note (Signed)
 Right  Planning neuro surg /intervention Causes bothersome pulsatile tinnitus

## 2024-10-03 NOTE — Progress Notes (Unsigned)
 Subjective:    Mindy Long is a 63 y.o. female who presents for a Welcome to Medicare exam.   Cardiac Risk Factors include: none      Objective:    Today's Vitals   10/03/24 1437  BP: 128/76  Pulse: 86  Temp: 98 F (36.7 C)  TempSrc: Oral  SpO2: 99%  Weight: 170 lb 6 oz (77.3 kg)  Height: 5' 2 (1.575 m)  Body mass index is 31.16 kg/m.  Medications Outpatient Encounter Medications as of 10/03/2024  Medication Sig   amLODipine  (NORVASC ) 5 MG tablet Take 1 tablet (5 mg total) by mouth daily.   Cholecalciferol (VITAMIN D3) 2000 UNITS capsule Take 4,000 Units by mouth daily.   gabapentin  (NEURONTIN ) 300 MG capsule TAKE 1 CAPSULE (300 MG TOTAL) BY MOUTH DAILY. CAUTION OF SEDATION   meloxicam (MOBIC) 15 MG tablet Take 15 mg by mouth daily.   metoprolol  tartrate (LOPRESSOR ) 25 MG tablet Take 1 tablet (25 mg total) by mouth 2 (two) times daily.   omeprazole  (PRILOSEC) 20 MG capsule TAKE 1 CAPSULE BY MOUTH DAILY AS NEEDED.   tiZANidine  (ZANAFLEX ) 4 MG tablet TAKE 1 TABLET (4 MG TOTAL) BY MOUTH EVERY 8 (EIGHT) HOURS AS NEEDED FOR MUSCLE SPASMS   [DISCONTINUED] predniSONE  (DELTASONE ) 10 MG tablet Take 4 pills once daily by mouth for 3 days, then 3 pills daily for 3 days, then 2 pills daily for 3 days then 1 pill daily for 3 days then stop   [DISCONTINUED] Ruxolitinib Phosphate (OPZELURA) 1.5 % CREA Apply 1 Application topically daily as needed.   No facility-administered encounter medications on file as of 10/03/2024.     History: Past Medical History:  Diagnosis Date   Back pain    Chronic back pain    GERD (gastroesophageal reflux disease)    Headache(784.0)    History of COVID-19    History of kidney stones    History of nephrolithiasis    Hypertension    Plantar fasciitis    PSVT (paroxysmal supraventricular tachycardia)    Pulmonary nodules    Sleep apnea    Ulcer 1980's   peptic ulcer disease   Past Surgical History:  Procedure Laterality Date    ABDOMINAL HYSTERECTOMY  2002   CHOLECYSTECTOMY  1984   COLONOSCOPY  12/18/2011   Normal   COLONOSCOPY WITH PROPOFOL  N/A 12/23/2021   Procedure: COLONOSCOPY WITH PROPOFOL ;  Surgeon: Jinny Carmine, MD;  Location: [facility] END[facility];  Service: Endoscopy;  Laterality: N/A;   IR ANGIO EXTERNAL CAROTID SEL EXT CAROTID BILAT MOD SED  11/15/2018   IR ANGIO INTRA EXTRACRAN SEL INTERNAL CAROTID BILAT MOD SED  11/15/2018   IR ANGIO VERTEBRAL SEL VERTEBRAL UNI R MOD SED  11/15/2018   IR US  GUIDE VASC ACCESS RIGHT  11/15/2018   LIPOMA EXCISION Left 05/20/2021   Procedure: EXCISION LIPOMA OF LEFT BUTTOCK;  Surgeon: Gladis Cough, MD;  Location: [facility];  Service: General;  Laterality: Left;   OOPHORECTOMY      Family History  Problem Relation Age of Onset   Stroke Father    Hypertension Father    Diabetes Father    Cancer Brother        stomach tumor   Social History   Occupational History   Occupation: ar specialist  Tobacco Use   Smoking status: Never   Smokeless tobacco: Never  Vaping Use   Vaping status: Never Used  Substance and Sexual Activity   Alcohol use: Yes    Comment: occas.  Drug use: No   Sexual activity: Not on file    Tobacco Counseling Counseling given: Not Answered   Immunizations and Health Maintenance Immunization History  Administered Date(s) Administered   Influenza Split 11/04/2011   Influenza, Seasonal, Injecte, Preservative Fre 09/28/2023   Influenza,inj,Quad PF,6+ Mos 11/27/2013, 11/13/2014, 09/23/2016, 08/15/2018, 08/22/2019, 08/22/2020, 09/23/2021, 09/24/2022   Moderna Covid-19 Vaccine Bivalent Booster 14yrs & up 12/06/2021   PFIZER Comirnaty(Gray Top)Covid-19 Tri-Sucrose Vaccine 06/13/2021, 08/30/2022   PFIZER(Purple Top)SARS-COV-2 Vaccination 02/29/2020, 03/25/2020   Pfizer(Comirnaty)Fall Seasonal Vaccine 12 years and older 04/27/2024   Td 06/20/2009   Tdap 08/22/2019   Zoster Recombinant(Shingrix ) 07/29/2022, 10/14/2022   Health Maintenance Due   Topic Date Due   Pneumococcal Vaccine: 50+ Years (1 of 1 - PCV) Never done   DEXA SCAN  Never done    Activities of Daily Living    10/03/2024    2:42 PM  In your present state of health, do you have any difficulty performing the following activities:  Hearing? 0  Vision? 0  Difficulty concentrating or making decisions? 0  Walking or climbing stairs? 0  Dressing or bathing? 0  Doing errands, shopping? 0  Preparing Food and eating ? N  Using the Toilet? N  In the past six months, have you accidently leaked urine? N  Do you have problems with loss of bowel control? N  Managing your Medications? N  Managing your Finances? N  Housekeeping or managing your Housekeeping? N   Review of Systems  Constitutional:  Negative for chills, fever and malaise/fatigue.       Mild fatigue on and off   HENT:  Positive for tinnitus. Negative for congestion, ear pain, sinus pain and sore throat.   Eyes:  Negative for blurred vision, discharge and redness.  Respiratory:  Negative for cough, shortness of breath and stridor.   Cardiovascular:  Negative for chest pain, palpitations and leg swelling.  Gastrointestinal:  Negative for abdominal pain, diarrhea, nausea and vomiting.  Musculoskeletal:  Positive for joint pain. Negative for myalgias.  Skin:  Negative for rash.  Neurological:  Negative for dizziness and headaches.     Physical Exam   Physical Exam Constitutional:      General: She is not in acute distress.    Appearance: Normal appearance. She is well-developed. She is obese. She is not ill-appearing or diaphoretic.  HENT:     Head: Normocephalic and atraumatic.     Right Ear: Tympanic membrane, ear canal and external ear normal.     Left Ear: Tympanic membrane, ear canal and external ear normal.     Nose: Nose normal. No congestion.     Mouth/Throat:     Mouth: Mucous membranes are moist.     Pharynx: Oropharynx is clear. No posterior oropharyngeal erythema.  Eyes:     General:  No scleral icterus.    Extraocular Movements: Extraocular movements intact.     Conjunctiva/sclera: Conjunctivae normal.     Pupils: Pupils are equal, round, and reactive to light.  Neck:     Thyroid : No thyromegaly.     Vascular: No carotid bruit or JVD.  Cardiovascular:     Rate and Rhythm: Normal rate and regular rhythm.     Pulses: Normal pulses.     Heart sounds: Normal heart sounds.     No gallop.  Pulmonary:     Effort: Pulmonary effort is normal. No respiratory distress.     Breath sounds: Normal breath sounds. No wheezing.     Comments:  Good air exch Chest:     Chest wall: No tenderness.  Abdominal:     General: Bowel sounds are normal. There is no distension or abdominal bruit.     Palpations: Abdomen is soft. There is no mass.     Tenderness: There is no abdominal tenderness.     Hernia: No hernia is present.  Genitourinary:    Comments: Breast exam: No mass, nodules, thickening, tenderness, bulging, retraction, inflamation, nipple discharge or skin changes noted.  No axillary or clavicular LA.     Musculoskeletal:        General: No tenderness. Normal range of motion.     Cervical back: Normal range of motion and neck supple. No rigidity. No muscular tenderness.     Right lower leg: No edema.     Left lower leg: No edema.     Comments: No kyphosis   Lymphadenopathy:     Cervical: No cervical adenopathy.  Skin:    General: Skin is warm and dry.     Coloration: Skin is not pale.     Findings: No erythema or rash.     Comments: Few lentigines   Neurological:     Mental Status: She is alert. Mental status is at baseline.     Cranial Nerves: No cranial nerve deficit.     Motor: No abnormal muscle tone.     Coordination: Coordination normal.     Gait: Gait normal.     Deep Tendon Reflexes: Reflexes are normal and symmetric. Reflexes normal.  Psychiatric:        Mood and Affect: Mood normal.        Cognition and Memory: Cognition and memory normal.    (optional),  or other factors deemed appropriate based on the beneficiary's medical and social history and current clinical standards.   Advanced Directives: Does Patient Have a Medical Advance Directive?: No Would patient like information on creating a medical advance directive?: Yes (Inpatient - patient requests chaplain consult to create a medical advance directive)  EKG:  Done at cardiology       Assessment:    This is a routine wellness examination for this patient .   Vision/Hearing screen Hearing Screening   500Hz  1000Hz  2000Hz  4000Hz   Right ear 40 40 40 40  Left ear 40 40 40 40   Vision Screening   Right eye Left eye Both eyes  Without correction 20/30 20/20 20/25   With correction        Goals      Increase physical activity     Weight (lb) < 200 lb (90.7 kg)        Depression Screen    10/03/2024    2:48 PM 09/07/2024    9:39 AM 09/28/2023   11:54 AM 09/24/2022   11:59 AM  PHQ 2/9 Scores  PHQ - 2 Score 0 0 0 0  PHQ- 9 Score   1 0     Fall Risk    10/03/2024    2:39 PM  Fall Risk   Falls in the past year? 0  Number falls in past yr: 0  Injury with Fall? 0  Risk for fall due to : No Fall Risks  Follow up Falls evaluation completed    Cognitive Function:        10/03/2024    2:48 PM  6CIT Screen  What Year? 0 points  What month? 0 points  What time? 0 points  Count back from 20 0 points  Months in reverse 0 points  Repeat phrase 0 points  Total Score 0 points   Not worried about memory  Does math without problems  Occasional word finding  A lot of computer work     Patient Care Team: Tower, Laine LABOR, MD as PCP - General (Family Medicine) End, Lonni, MD as PCP - Cardiology (Cardiology) Bozeman, Royden DASEN, DPM as Consulting Physician (Podiatry) Bonner Ade, MD as Consulting Physician (Physical Medicine and Rehabilitation)   Chronic problems   HTN bp is stable today  No cp or palpitations or headaches or edema  No side effects to  medicines  BP Readings from Last 3 Encounters:  10/03/24 128/76  09/07/24 134/82  08/18/24 110/78    Metoprolol  12.5 mg bid Amlodipine  5 mg daily  Lab Results  Component Value Date   NA 141 09/26/2024   K 3.5 09/26/2024   CO2 26 09/26/2024   GLUCOSE 91 09/26/2024   BUN 14 09/26/2024   CREATININE 0.85 09/26/2024   CALCIUM 8.9 09/26/2024   GFR 71.93 09/26/2024   GFRNONAA >60 10/22/2021   Ppi Omepraozle Lab Results  Component Value Date   VITAMINB12 335 09/26/2024   Lab Results  Component Value Date   WBC 7.5 09/26/2024   HGB 13.7 09/26/2024   HCT 42.0 09/26/2024   MCV 77.3 (L) 09/26/2024   PLT 234.0 09/26/2024   Last vitamin D  Lab Results  Component Value Date   VD25OH 35.08 09/26/2024   Prediabetes Lab Results  Component Value Date   HGBA1C 6.1 09/26/2024   HGBA1C 5.9 09/21/2023   HGBA1C 5.9 08/18/2017   Interested in dexa No new falls or fractures  D3  Exercise - getting back to it after a knee injury  Twice weekly  Water exercise with resistance   Lab Results  Component Value Date   ALT 15 09/26/2024   AST 14 09/26/2024   ALKPHOS 74 09/26/2024   BILITOT 0.6 09/26/2024   Lab Results  Component Value Date   TSH 1.33 09/26/2024   Cholesterol Lab Results  Component Value Date   CHOL 228 (H) 09/26/2024   CHOL 212 (H) 09/21/2023   CHOL 218 (H) 09/17/2022   Lab Results  Component Value Date   HDL 86.60 09/26/2024   HDL 78.20 09/21/2023   HDL 84.00 09/17/2022   Lab Results  Component Value Date   LDLCALC 128 (H) 09/26/2024   LDLCALC 120 (H) 09/21/2023   LDLCALC 122 (H) 09/17/2022   Lab Results  Component Value Date   TRIG 68.0 09/26/2024   TRIG 69.0 09/21/2023   TRIG 59.0 09/17/2022   Lab Results  Component Value Date   CHOLHDL 3 09/26/2024   CHOLHDL 3 09/21/2023   CHOLHDL 3 09/17/2022   Lab Results  Component Value Date   LDLDIRECT 126.7 10/23/2011   LDLDIRECT 132.9 06/20/2009   LDLDIRECT 109.4 01/25/2008    The 10-year  ASCVD risk score (Arnett DK, et al., 2019) is: 9.3%   Values used to calculate the score:     Age: 36 years     Clincally relevant sex: Female     Is Non-Hispanic African American: Yes     Diabetic: No     Tobacco smoker: No     Systolic Blood Pressure: 128 mmHg     Is BP treated: Yes     HDL Cholesterol: 86.6 mg/dL     Total Cholesterol: 228 mg/dL  Diet is fairly good     Plan:    Problem List  Items Addressed This Visit       Cardiovascular and Mediastinum   Stenosis of internal jugular vein   Right  Planning neuro surg /intervention Causes bothersome pulsatile tinnitus       Essential hypertension   bp in fair control at this time  BP Readings from Last 1 Encounters:  10/03/24 128/76   No changes needed Most recent labs reviewed  Disc lifstyle change with low sodium diet and exercise  Plan to continue metoprolol  12.5 mg bid amloidpine 5 mg daily         Nervous and Auditory   Pulsatile tinnitus   Planning intervention for right internal jugular stenosis with neuro surgery  Reviewed neuro note          Musculoskeletal and Integument   Osteoarthritis, knee   Getting back to water exercise now         Other   Welcome to Medicare preventive visit - Primary   Reviewed health habits including diet and exercise and skin cancer prevention Reviewed appropriate screening tests for age  Also reviewed health mt list, fam hx and immunization status , as well as social and family history   See HPI Labs reviewed and ordered Health Maintenance  Topic Date Due   Pneumococcal Vaccine for age over 17 (1 of 1 - PCV) Never done   DEXA scan (bone density measurement)  Never done   Flu Shot  03/06/2025*   COVID-19 Vaccine (7 - 2025-26 season) 10/20/2027*   Breast Cancer Screening  08/09/2025   Medicare Annual Wellness Visit  10/03/2025   Colon Cancer Screening  12/23/2026   DTaP/Tdap/Td vaccine (3 - Td or Tdap) 08/21/2029   Hepatitis C Screening  Completed   HIV  Screening  Completed   Zoster (Shingles) Vaccine  Completed   Hepatitis B Vaccine  Aged Out   Meningitis B Vaccine  Aged Out  *Topic was postponed. The date shown is not the original due date.    Flu shot today  Prevnar 20 today No help needed with ADLs Dexa ordered, no falls or fractures  Discussed fall prevention, supplements and exercise for bone density  Given materials for advance directive and reviewed  EKG utd cardiology  Normal hearing and vision screens  PHQ 0 No cognitive concerns/ discussed ways to combat short term memory loss  Care team reviewed  Chronic health issues reviewed       Vitamin D  deficiency   Last vitamin D  Lab Results  Component Value Date   VD25OH 35.08 09/26/2024   Vitamin D  level is therapeutic with current supplementation Disc importance of this to bone and overall health       Pure hypercholesterolemia   Disc goals for lipids and reasons to control them Rev last labs with pt Rev low sat fat diet in detail  LDL up slightly to 128 HDL great at 86.6 10 y risk 9.3 % Has seen cardiology      Prediabetes   Prediabetes  Lab Results  Component Value Date   HGBA1C 6.1 09/26/2024   HGBA1C 5.9 09/21/2023   HGBA1C 5.9 08/18/2017    disc imp of low glycemic diet and wt loss to prevent DM2        Headache, chronic daily   Controlled with gabapentin  300 mg daily       Estrogen deficiency   Dexa ordered  Pt will call to schedule       Relevant Orders   DG Bone Density   Current  use of proton pump inhibitor   Lab Results  Component Value Date   VITAMINB12 335 09/26/2024   Last vitamin D  Lab Results  Component Value Date   VD25OH 35.08 09/26/2024         Colon cancer screening   Colonoscopy 12/2021 with 5 y recall       Other Visit Diagnoses       Encounter for Medicare annual wellness exam         Need for influenza vaccination       Relevant Orders   Flu vaccine HIGH DOSE PF(Fluzone Trivalent) (Completed)      Need for pneumococcal 20-valent conjugate vaccination       Relevant Orders   Pneumococcal conjugate vaccine 20-valent (Prevnar 20)        I have personally reviewed and noted the following in the patient's chart:   Medical and social history Use of alcohol, tobacco or illicit drugs  Current medications and supplements including opioid prescriptions. Patient is not currently taking opioid prescriptions. Functional ability and status Nutritional status Physical activity Advanced directives List of other physicians Hospitalizations, surgeries, and ER visits in previous 12 months Vitals Screenings to include cognitive, depression, and falls Referrals and appointments  In addition, I have reviewed and discussed with patient certain preventive protocols, quality metrics, and best practice recommendations. A written personalized care plan for preventive services as well as general preventive health recommendations were provided to patient.     Laine Balls, MD 10/03/2024

## 2024-10-03 NOTE — Assessment & Plan Note (Signed)
 Getting back to water exercise now

## 2024-10-03 NOTE — Assessment & Plan Note (Signed)
 Prediabetes  Lab Results  Component Value Date   HGBA1C 6.1 09/26/2024   HGBA1C 5.9 09/21/2023   HGBA1C 5.9 08/18/2017    disc imp of low glycemic diet and wt loss to prevent DM2

## 2024-10-03 NOTE — Assessment & Plan Note (Signed)
 Last vitamin D  Lab Results  Component Value Date   VD25OH 35.08 09/26/2024   Vitamin D  level is therapeutic with current supplementation Disc importance of this to bone and overall health

## 2024-10-03 NOTE — Assessment & Plan Note (Signed)
 Disc goals for lipids and reasons to control them Rev last labs with pt Rev low sat fat diet in detail  LDL up slightly to 128 HDL great at 86.6 10 y risk 9.3 % Has seen cardiology

## 2024-10-03 NOTE — Assessment & Plan Note (Signed)
 Reviewed health habits including diet and exercise and skin cancer prevention Reviewed appropriate screening tests for age  Also reviewed health mt list, fam hx and immunization status , as well as social and family history   See HPI Labs reviewed and ordered Health Maintenance  Topic Date Due   Pneumococcal Vaccine for age over 61 (1 of 1 - PCV) Never done   DEXA scan (bone density measurement)  Never done   Flu Shot  03/06/2025*   COVID-19 Vaccine (7 - 2025-26 season) 10/20/2027*   Breast Cancer Screening  08/09/2025   Medicare Annual Wellness Visit  10/03/2025   Colon Cancer Screening  12/23/2026   DTaP/Tdap/Td vaccine (3 - Td or Tdap) 08/21/2029   Hepatitis C Screening  Completed   HIV Screening  Completed   Zoster (Shingles) Vaccine  Completed   Hepatitis B Vaccine  Aged Out   Meningitis B Vaccine  Aged Out  *Topic was postponed. The date shown is not the original due date.    Flu shot today  Prevnar 20 today No help needed with ADLs Dexa ordered, no falls or fractures  Discussed fall prevention, supplements and exercise for bone density  Given materials for advance directive and reviewed  EKG utd cardiology  Normal hearing and vision screens  PHQ 0 No cognitive concerns/ discussed ways to combat short term memory loss  Care team reviewed  Chronic health issues reviewed

## 2024-10-03 NOTE — Assessment & Plan Note (Signed)
Dexa ordered Pt will call to schedule

## 2024-10-03 NOTE — Patient Instructions (Addendum)
 Work on the advance directive / get it notarized and get us  a copy   Flu shot today  Pneumonia vaccine today   Socialization and exercise help memory  Work on strength training   To prevent diabetes Avoid added sugars in your diet when you can  Try to get most of your carbohydrates from produce (with the exception of white potatoes) and whole grains Eat less bread/pasta/rice/snack foods/cereals/sweets and other items from the middle of the grocery store (processed carbs)  For cholesterol  Avoid red meat/ fried foods/ egg yolks/ fatty breakfast meats/ butter, cheese and high fat dairy/ and shellfish     You have an order for:  []   3D Mammogram  [x]   Bone Density     Please call for appointment:   [x]   [facility]  [address] [place] [address]  [phone]  []   [facility])   [address]  [place], [place] [address]  [phone]  []   [facility]      [address] [place], [place]        [phone]         []   [facility]  [address] [place], [place]  [phone]  []  [facility]   [address]   [place], [place] [address]  [phone]  []  [facility]  [address] [place], [place] [address] [phone]    Make sure to wear two piece clothing  No lotions powders or deodorants the day of the appointment Make sure to bring picture ID and insurance card.  Bring list of medications you are currently taking including any supplements.   Schedule your screening mammogram through MyChart!   Select [place] imaging sites can now be scheduled through MyChart.  Log into your MyChart account.  Go to 'Visit' (or 'Appointments' if  on mobile App) --> Schedule an  Appointment  Under 'Select a  Reason for Visit' choose the Mammogram  Screening option.  Complete the pre-visit questions  and select the time and place that  best fits your schedule

## 2024-10-03 NOTE — Assessment & Plan Note (Signed)
Colonoscopy 12/2021- with 5 y recall

## 2024-10-03 NOTE — Assessment & Plan Note (Signed)
 bp in fair control at this time  BP Readings from Last 1 Encounters:  10/03/24 128/76   No changes needed Most recent labs reviewed  Disc lifstyle change with low sodium diet and exercise  Plan to continue metoprolol  12.5 mg bid amloidpine 5 mg daily

## 2024-10-03 NOTE — Assessment & Plan Note (Signed)
 Controlled with gabapentin 300 mg daily

## 2024-10-22 ENCOUNTER — Emergency Department: Admission: EM | Admit: 2024-10-22 | Discharge: 2024-10-22 | Disposition: A | Discharge status: Home / Self Care

## 2024-10-22 ENCOUNTER — Emergency Department

## 2024-10-22 ENCOUNTER — Other Ambulatory Visit: Payer: Self-pay

## 2024-10-22 DIAGNOSIS — R0789 Other chest pain: Secondary | ICD-10-CM | POA: Insufficient documentation

## 2024-10-22 DIAGNOSIS — R0602 Shortness of breath: Secondary | ICD-10-CM | POA: Diagnosis not present

## 2024-10-22 DIAGNOSIS — R079 Chest pain, unspecified: Secondary | ICD-10-CM

## 2024-10-22 LAB — BASIC METABOLIC PANEL WITH GFR
Anion gap: 10 (ref 5–15)
BUN: 14 mg/dL (ref 8–23)
CO2: 25 mmol/L (ref 22–32)
Calcium: 9.5 mg/dL (ref 8.9–10.3)
Chloride: 106 mmol/L (ref 98–111)
Creatinine, Ser: 0.97 mg/dL (ref 0.44–1.00)
GFR, Estimated: >60 mL/min (ref >60)
Glucose, Bld: 120 mg/dL — ABNORMAL HIGH (ref 70–99)
Potassium: 3.6 mmol/L (ref 3.5–5.1)
Sodium: 141 mmol/L (ref 135–145)

## 2024-10-22 LAB — RESP PANEL BY RT-PCR (RSV, FLU A&B, COVID)  RVPGX2
Influenza A by PCR: NEGATIVE
Influenza B by PCR: NEGATIVE
Resp Syncytial Virus by PCR: NEGATIVE
SARS Coronavirus 2 by RT PCR: NEGATIVE

## 2024-10-22 LAB — PRO BRAIN NATRIURETIC PEPTIDE: Pro Brain Natriuretic Peptide: <50 pg/mL (ref <300.0)

## 2024-10-22 LAB — CBC
HCT: 43.5 % (ref 36.0–46.0)
Hemoglobin: 13.7 g/dL (ref 12.0–15.0)
MCH: 24.9 pg — ABNORMAL LOW (ref 26.0–34.0)
MCHC: 31.5 g/dL (ref 30.0–36.0)
MCV: 79.1 fL — ABNORMAL LOW (ref 80.0–100.0)
Platelets: 225 K/uL (ref 150–400)
RBC: 5.5 MIL/uL — ABNORMAL HIGH (ref 3.87–5.11)
RDW: 14 % (ref 11.5–15.5)
WBC: 4.7 K/uL (ref 4.0–10.5)
nRBC: 0 % (ref 0.0–0.2)

## 2024-10-22 LAB — TROPONIN T, HIGH SENSITIVITY: Troponin T High Sensitivity: <15 ng/L (ref 0–19)

## 2024-10-22 NOTE — ED Notes (Signed)
Rainbow drawn sent to lab.

## 2024-10-22 NOTE — ED Provider Notes (Signed)
 [facility] Provider Note    Event Date/Time   First MD Initiated Contact with Patient 10/22/24 1404     (approximate)   History   Shortness of Breath   HPI  Mindy Long is a 63 y.o. female who presents today with concern of chest pain and shortness of breath.  Since Friday has been dealing with symptoms of some chest discomfort which occurs intermittently can be when laying or when trying to do things at home.  Having accompanying fatigue and exhaustion.  She also endorses some shortness of breath that occurs spontaneously, sometimes she states that she will get very tired for no reason.  Has not had any swelling in her extremities.  No prior history of PEs DVTs.  Yesterday started having a runny nose and a cough.  No known sick contacts.  Currently while at rest feeling well in no acute distress.  States that she has a dull aching pain at this time, has not tried taking anything for the symptoms.  Denies any other medical history.     Physical Exam   Triage Vital Signs: ED Triage Vitals  Encounter Vitals Group     BP 10/22/24 1324 (!) 151/77     Girls Systolic BP Percentile --      Girls Diastolic BP Percentile --      Boys Systolic BP Percentile --      Boys Diastolic BP Percentile --      Pulse Rate 10/22/24 1324 (!) 101     Resp 10/22/24 1324 20     Temp 10/22/24 1324 98.3 F (36.8 C)     Temp Source 10/22/24 1324 Oral     SpO2 10/22/24 1324 97 %     Weight 10/22/24 1325 170 lb (77.1 kg)     Height 10/22/24 1325 5' 2 (1.575 m)     Head Circumference --      Peak Flow --      Pain Score 10/22/24 1324 7     Pain Loc --      Pain Education --      Exclude from Growth Chart --     Most recent vital signs: Vitals:   10/22/24 1324  BP: (!) 151/77  Pulse: (!) 101  Resp: 20  Temp: 98.3 F (36.8 C)  SpO2: 97%     General: Awake, no distress.  CV:  Good peripheral perfusion.  No extremity swelling appreciated, regular rate and  rhythm Resp:  Normal effort.  Clear to auscultation bilaterally, no noted tachypnea able to speak in full sentences Abd:  No distention.  Soft nontender nondistended Other:     ED Results / Procedures / Treatments   Labs (all labs ordered are listed, but only abnormal results are displayed) Labs Reviewed  BASIC METABOLIC PANEL WITH GFR - Abnormal; Notable for the following components:      Result Value   Glucose, Bld 120 (*)    All other components within normal limits  CBC - Abnormal; Notable for the following components:   RBC 5.50 (*)    MCV 79.1 (*)    MCH 24.9 (*)    All other components within normal limits  RESP PANEL BY RT-PCR (RSV, FLU A&B, COVID)  RVPGX2  PRO BRAIN NATRIURETIC PEPTIDE  TROPONIN T, HIGH SENSITIVITY     EKG  Sinus rhythm with rate of about 95, axis of about 15, intervals appear to be within normal limits with the presence of right bundle  branch block, no obvious ischemia that appreciated on this EKG with nonspecific T wave inversions noted in leads V3, V4, V5, V6   RADIOLOGY No acute findings  PROCEDURES:  Critical Care performed: No  Procedures   MEDICATIONS ORDERED IN ED: Medications - No data to display   IMPRESSION / MDM / ASSESSMENT AND PLAN / ED COURSE  I reviewed the triage vital signs and the nursing notes.                               Patient's presentation is most consistent with acute, uncomplicated illness.  63 year old female who presents today with concern of chest pain shortness of breath which seems to be on and off intermittently, but not related with exertion.  At times can occur while laying back.  She does have some accompanying symptoms of URI, but chest pain has been going on for about 2 or 3 days before the symptoms onset.  Her vitals here are reassuring, her labs also are within normal limits, and chest x-ray is without acute findings.  Some questionable T wave changes on EKG.  Were adding on a troponin for her  workup, but I anticipate likely reasonable for discharge home with outpatient cardiology follow-up.  Discussed with the patient she is agreeable with this plan.   Clinical Course as of 10/22/24 1550  Sun Oct 22, 2024  1505 Patient troponin within normal limits will have her discharged home here with plan for follow-up with cardiology in the outpatient setting. [SK]    Clinical Course User Index [SK] Fernand Rossie HERO, MD     FINAL CLINICAL IMPRESSION(S) / ED DIAGNOSES   Final diagnoses:  Chest pain, unspecified type     Rx / DC Orders   ED Discharge Orders          Ordered    Ambulatory referral to Cardiology       Comments: If you have not heard from the Cardiology office within the next 72 hours please call [phone].   10/22/24 1506             Note:  This document was prepared using Dragon voice recognition software and may include unintentional dictation errors.   Fernand Rossie HERO, MD 10/22/24 [phone]

## 2024-10-22 NOTE — Discharge Instructions (Signed)
 You were seen today due to concern of chest pain.  At this time fortunately your blood work is reassuring, I have placed a referral for our cardiology team, they will be contacting you to arrange for an appointment.  If you have any worsening of symptoms such as increased chest pain, shortness of breath, lightheadedness, or any other symptoms you find concerning please return to the emergency department immediately for further medical management.

## 2024-10-22 NOTE — ED Triage Notes (Signed)
 Pt c/o SOB (mostly on exertion), headache, and chest pain x 2-3 days. Last night, started with runny nose & nonproductive cough.

## 2024-11-07 ENCOUNTER — Ambulatory Visit

## 2024-11-09 ENCOUNTER — Ambulatory Visit: Admitting: Medical

## 2024-11-09 NOTE — Progress Notes (Unsigned)
 Cardiology Office Note   Date:  11/13/2024  ID:  Mindy Long, DOB 1961/11/06, MRN [id number] PCP: Randeen Laine LABOR, MD  [facility] Providers Cardiologist:  Lonni Hanson, MD   History of Present Illness Mindy Long is a 63 y.o. female PMH PSVT, aortic atherosclerosis, HTN, HLD who presents for further evaluation and management of chest pain palpitations.  Previously saw Dr. Hanson 08/2024.  Was seen in the ED 10/22/2024 for chest discomfort and shortness of breath.  Basic workup including RVP, troponin, BNP, CBC, BMP all unremarkable.  Last LDL 128 09/2024.  Patient notes that the symptoms seem to have eased up a bit since her ED visit.  She does still intermittently have random chest discomfort that is localized to the lateral chest wall.  She denies any positional quality or changes to this symptom.  She denies any exertional component.  She denies any obvious pattern, the symptoms seem to come and go at random.  She does note that this seems to be happening less frequently since her ED visit.  She has not taken anything for the pain.  Occasional palpitations.  No syncope  Relevant CVD History -Coronary CTA 10/2021 CAD RADS 0 - TTE 05/2021 normal biventricular function and no significant valvular disease - Monitor 04/2021 mean heart rate 76 bpm, rare ectopy, 2 brief episodes of SVT (longest 9.6 seconds, max HR 184 bpm).  Patient triggers corresponded to sinus rhythm and PACs.   ROS: Pt denies any  jaw pain, arm pain, syncope, presyncope, orthopnea, PND, or LE edema.  Studies Reviewed I have independently reviewed the patient's ECG, recent medical records, recent blood work, previous cardiac testing.  Physical Exam VS:  BP 108/88 (BP Location: Left Arm, Patient Position: Sitting, Cuff Size: Normal)   Pulse (!) 107   Ht 5' 3 (1.6 m)   Wt 172 lb 9.6 oz (78.3 kg)   SpO2 98%   BMI 30.57 kg/m        Wt Readings from Last 3 Encounters:  11/13/24 172 lb 9.6 oz (78.3  kg)  10/22/24 170 lb (77.1 kg)  10/03/24 170 lb 6 oz (77.3 kg)    GEN: No acute distress. NECK: No JVD; No carotid bruits. CARDIAC: Tachycardic, no murmurs, rubs, gallops. RESPIRATORY:  Clear to auscultation. EXTREMITIES:  Warm and well-perfused. No edema.  ASSESSMENT AND PLAN Chest discomfort Shortness of breath Paroxysmal tachycardia Palpitations Patient presents with undifferentiated chest pain.  This seems to have gotten better since her recent ED visit without intervention.  The pain comes and goes and does not have any clear exertional or positional pattern to it.  CAD RADS 0 coronary CTA 2022, so development of severe obstructive CAD seems unlikely, especially with no exertional symptoms. No positional quality to the pain and random/intermittent onset makes something like pericarditis unlikely. She is pain free during visit. She does also have some palpitations and is mildly tachycardic today.  No high risk features such as syncope.  Overall, suspect that this is potentially a musculoskeletal etiology such as costochondritis or pleuritis, but we will risk stratify her further.  Plan: - Given low pretest probability, will obtain D-dimer to rule out PE - Echocardiogram to evaluate for structural causes - Monitor to evaluate for arrhythmogenic causes - As above, CAD RADS 0 in 2022 with non-anginal symptoms, so we will defer any ischemic evaluation - Advised trial of anti-inflammatories while workup is underway       Dispo: RTC with regular provider or sooner as  needed  Signed, Caron Poser, MD

## 2024-11-13 ENCOUNTER — Ambulatory Visit

## 2024-11-13 VITALS — BP 108/88 | HR 107 | Ht 63.0 in | Wt 172.6 lb

## 2024-11-13 DIAGNOSIS — R0609 Other forms of dyspnea: Secondary | ICD-10-CM | POA: Diagnosis not present

## 2024-11-13 DIAGNOSIS — R079 Chest pain, unspecified: Secondary | ICD-10-CM

## 2024-11-13 DIAGNOSIS — R002 Palpitations: Secondary | ICD-10-CM

## 2024-11-13 DIAGNOSIS — I479 Paroxysmal tachycardia, unspecified: Secondary | ICD-10-CM

## 2024-11-13 NOTE — Patient Instructions (Addendum)
 Medication Instructions:  Your physician recommends that you continue on your current medications as directed. Please refer to the Current Medication list given to you today.  *If you need a refill on your cardiac medications before your next appointment, please call your pharmacy*  Lab Work: No labs ordered today  If you have labs (blood work) drawn today and your tests are completely normal, you will receive your results only by: MyChart Message (if you have MyChart) OR A paper copy in the mail If you have any lab test that is abnormal or we need to change your treatment, we will call you to review the results.  Testing/Procedures: Your physician has requested that you have an echocardiogram. Echocardiography is a painless test that uses sound waves to create images of your heart. It provides your doctor with information about the size and shape of your heart and how well your heart's chambers and valves are working.   You may receive an ultrasound enhancing agent through an IV if needed to better visualize your heart during the echo. This procedure takes approximately one hour.  There are no restrictions for this procedure.  This will take place at [address] [facility] [address], [place] [address]  Please note: We ask at that you not bring children with you during ultrasound (echo/ vascular) testing. Due to room size and safety concerns, children are not allowed in the ultrasound rooms during exams. Our front office staff cannot provide observation of children in our lobby area while testing is being conducted. An adult accompanying a patient to their appointment will only be allowed in the ultrasound room at the discretion of the ultrasound technician under special circumstances. We apologize for any inconvenience.    ZIO XT- Long Term Monitor Instructions  Your physician has requested you wear a ZIO patch monitor for 14 days.  This is a single patch monitor. [employer]  supplies one patch monitor per enrollment. Additional stickers are not available. Please do not apply patch if you will be having a Nuclear Stress Test, Echocardiogram, Cardiac CT, MRI, or Chest Xray during the period you would be wearing the monitor. The patch cannot be worn during these tests. You cannot remove and re-apply the ZIO XT patch monitor.  Your ZIO patch monitor will be mailed 3 day USPS to your address on file. It may take 3-5 days to receive your monitor after you have been enrolled. Once you have received your monitor, please review the enclosed instructions. Your monitor has already been registered assigning a specific monitor serial number to you.  Billing and Patient Assistance Program Information  We have supplied [employer] with any of your insurance information on file for billing purposes.  [employer] offers a sliding scale Patient Assistance Program for patients that do not have insurance, or whose insurance does not completely cover the cost of the ZIO monitor.  You must apply for the Patient Assistance Program to qualify for this discounted rate.  To apply, please call [employer] at [phone], select option 4, select option 2, ask to apply for Patient Assistance Program. Meredeth will ask your household income, and how many people are in your household. They will quote your out-of-pocket cost based on that information. [employer] will also be able to set up a 4-month, interest-free payment plan if needed.  Applying the monitor   Shave hair from upper left chest.  Hold abrader disc by orange tab. Rub abrader in 40 strokes over the upper left chest as indicated  in your monitor instructions.  Clean area with 4 enclosed alcohol pads. Let dry.  Apply patch as indicated in monitor instructions. Patch will be placed under collarbone on left side of chest with arrow pointing upward.  Rub patch adhesive wings for 2 minutes. Remove white label marked 1. Remove the white label marked 2. Rub  patch adhesive wings for 2 additional minutes.  While looking in a mirror, press and release button in center of patch. A small green light will flash 3-4 times. This will be your only indicator that the monitor has been turned on.   After Applying Monitor: Do not shower for the first 24 hours. You may shower after the first 24 hours.  Press the button if you feel a symptom. You will hear a small click. Record Date, Time and Symptom in the Patient Logbook.   After Completing 14 Days: When you are ready to remove the patch, follow instructions on the last 2 pages of Patient Logbook.  Stick patch monitor into the tabs at the bottom of the return box.  Place Patient Logbook in the blue and white box. Use locking tab on box and tape box closed securely. The blue and white box has prepaid postage on it. Please place it in the mailbox as soon as possible. Your physician should have your test results approximately 7-14 days after the monitor has been mailed back to [facility].   Troubleshooting: Call [facility] at [phone] if you have questions regarding your ZIO XT patch monitor.  Call them immediately if you see an orange light blinking on your monitor.  If your monitor falls off in less than 4 days, contact our Monitor department at [phone].  If your monitor becomes loose or falls off after 4 days call [employer] at [phone] for suggestions on securing your monitor.   Follow-Up: At [facility], you and your health needs are our priority.  As part of our continuing mission to provide you with exceptional heart care, our providers are all part of one team.  This team includes your primary Cardiologist (physician) and Advanced Practice Providers or APPs (Physician Assistants and Nurse Practitioners) who all work together to provide you with the care you need, when you need it.  Your next appointment:   As Needed   Provider:   You may see Lonni Hanson,  MD or one of the following Advanced Practice Providers on your designated Care Team:   Lonni Meager, NP Lesley Maffucci, PA-C Bernardino Bring, PA-C Cadence Maricao, PA-C Tylene Lunch, NP Barnie Hila, NP   We recommend signing up for the patient portal called MyChart.  Sign up information is provided on this After Visit Summary.  MyChart is used to connect with patients for Virtual Visits (Telemedicine).  Patients are able to view lab/test results, encounter notes, upcoming appointments, etc.  Non-urgent messages can be sent to your provider as well.   To learn more about what you can do with MyChart, go to [email or web address].

## 2024-11-16 DIAGNOSIS — I871 Compression of vein: Secondary | ICD-10-CM | POA: Diagnosis not present

## 2024-11-16 DIAGNOSIS — H93A1 Pulsatile tinnitus, right ear: Secondary | ICD-10-CM | POA: Diagnosis not present

## 2024-12-05 ENCOUNTER — Ambulatory Visit: Payer: Self-pay

## 2024-12-05 DIAGNOSIS — R002 Palpitations: Secondary | ICD-10-CM | POA: Diagnosis not present

## 2024-12-05 DIAGNOSIS — I479 Paroxysmal tachycardia, unspecified: Secondary | ICD-10-CM

## 2024-12-05 DIAGNOSIS — R079 Chest pain, unspecified: Secondary | ICD-10-CM

## 2024-12-15 ENCOUNTER — Ambulatory Visit

## 2024-12-15 DIAGNOSIS — R079 Chest pain, unspecified: Secondary | ICD-10-CM

## 2024-12-15 DIAGNOSIS — I479 Paroxysmal tachycardia, unspecified: Secondary | ICD-10-CM | POA: Diagnosis not present

## 2024-12-15 DIAGNOSIS — R002 Palpitations: Secondary | ICD-10-CM

## 2024-12-15 LAB — ECHOCARDIOGRAM COMPLETE
AR max vel: 2.68 cm2
AV Area VTI: 2.62 cm2
AV Area mean vel: 2.68 cm2
AV Mean grad: 3 mmHg
AV Peak grad: 5.6 mmHg
Ao pk vel: 1.18 m/s
Area-P 1/2: 4.49 cm2
Calc EF: 54.1 %
S' Lateral: 2.9 cm
Single Plane A2C EF: 52.2 %
Single Plane A4C EF: 55.6 %

## 2024-12-17 ENCOUNTER — Other Ambulatory Visit: Payer: Self-pay | Admitting: Family Medicine
